# Patient Record
Sex: Male | Born: 1980
Health system: Southern US, Community
[De-identification: ages and names within clinical notes are randomized; demographics above are authoritative.]

## PROBLEM LIST (undated history)

## (undated) DIAGNOSIS — F4310 Post-traumatic stress disorder, unspecified: Secondary | ICD-10-CM

## (undated) DIAGNOSIS — I10 Essential (primary) hypertension: Secondary | ICD-10-CM

---

## 2004-07-02 ENCOUNTER — Emergency Department (HOSPITAL_COMMUNITY): Admission: EM | Admit: 2004-07-02 | Discharge: 2004-07-02 | Payer: Self-pay | Admitting: Emergency Medicine

## 2005-09-11 ENCOUNTER — Emergency Department (HOSPITAL_COMMUNITY): Admission: EM | Admit: 2005-09-11 | Discharge: 2005-09-11 | Payer: Self-pay | Admitting: Emergency Medicine

## 2010-08-15 ENCOUNTER — Emergency Department (HOSPITAL_BASED_OUTPATIENT_CLINIC_OR_DEPARTMENT_OTHER)
Admission: EM | Admit: 2010-08-15 | Discharge: 2010-08-15 | Payer: Self-pay | Source: Home / Self Care | Admitting: Emergency Medicine

## 2014-07-04 ENCOUNTER — Emergency Department (HOSPITAL_COMMUNITY): Payer: Self-pay

## 2014-07-04 ENCOUNTER — Encounter (HOSPITAL_COMMUNITY): Payer: Self-pay | Admitting: Emergency Medicine

## 2014-07-04 ENCOUNTER — Emergency Department (HOSPITAL_COMMUNITY)
Admission: EM | Admit: 2014-07-04 | Discharge: 2014-07-04 | Disposition: A | Discharge status: Home / Self Care | Payer: Self-pay | Attending: Emergency Medicine | Admitting: Emergency Medicine

## 2014-07-04 DIAGNOSIS — M2540 Effusion, unspecified joint: Secondary | ICD-10-CM | POA: Insufficient documentation

## 2014-07-04 DIAGNOSIS — L03032 Cellulitis of left toe: Secondary | ICD-10-CM | POA: Insufficient documentation

## 2014-07-04 DIAGNOSIS — R52 Pain, unspecified: Secondary | ICD-10-CM

## 2014-07-04 MED ORDER — CEPHALEXIN 500 MG PO CAPS: 500.0000 mg | ORAL_CAPSULE | Freq: Four times a day (QID) | ORAL | Status: DC

## 2014-07-04 MED ORDER — HYDROCODONE-ACETAMINOPHEN 5-325 MG PO TABS: 1.0000 | ORAL_TABLET | ORAL | Status: DC | PRN

## 2014-07-04 NOTE — ED Provider Notes (Signed)
CSN: [id number]     Arrival date & time 07/04/14  1442 History  This chart was scribed for Marlon Peliffany Diamonds Lippard, PA-C, working with Linwood DibblesJon Knapp, MD by Chestine SporeSoijett Blue, ED Scribe. The patient was seen in room WTR6/WTR6 at 4:23 PM.    Chief Complaint  Patient presents with  . Toe Pain     The history is provided by the patient. No language interpreter was used.   HPI Comments: Andrew ProctorCornell J [name] is a 33 y.o. male who presents to the Emergency Department complaining of toe pain onset 4 days. He states that the pain is in his L middle toe. He states that he was barefoot a couple days ago and he doesn't know what he did. He states that it was purple and it was swelling then. But most of the swelling and purple discoloration resolved. He states that it hurts when he puts pressure to walk on it and it is burning. He states that he is having associated symptoms of joint swelling. He denies any other symptoms. He denies a splinter or anything getting in his toe. He states that he is unemployed.  History reviewed. No pertinent past medical history. History reviewed. No pertinent past surgical history. No family history on file. History  Substance Use Topics  . Smoking status: Not on file  . Smokeless tobacco: Not on file  . Alcohol Use: Not on file    Review of Systems  Musculoskeletal: Positive for joint swelling and arthralgias.  All other systems reviewed and are negative.   Allergies  Review of patient's allergies indicates not on file.  Home Medications   Prior to Admission medications   Medication Sig Start Date End Date Taking? Authorizing Provider  cephALEXin (KEFLEX) 500 MG capsule Take 1 capsule (500 mg total) by mouth 4 (four) times daily. 07/04/14   Kasch Borquez Irine SealG Jefferson Fullam, PA-C  HYDROcodone-acetaminophen (NORCO/VICODIN) 5-325 MG per tablet Take 1-2 tablets by mouth every 4 (four) hours as needed for moderate pain or severe pain. 07/04/14   Meranda Dechaine Irine SealG Jona Zappone, PA-C   BP 151/93 mmHg  Pulse 88   Temp(Src) 98.5 F (36.9 C) (Oral)  Resp 20  SpO2 98%  Physical Exam  Constitutional: He is oriented to person, place, and time. He appears well-developed and well-nourished. No distress.  HENT:  Head: Normocephalic and atraumatic.  Eyes: EOM are normal.  Neck: Neck supple. No tracheal deviation present.  Cardiovascular: Normal rate.   Pulmonary/Chest: Effort normal. No respiratory distress.  Musculoskeletal: Normal range of motion.  Induration and swelling. Full ROM. No pain with passive ROM. No signs of trauma or skin disruption. No ecchymosis. Skin is warm and dry. Cap refill less than 2 seconds.   Neurological: He is alert and oriented to person, place, and time.  Skin: Skin is warm and dry.  Psychiatric: He has a normal mood and affect. His behavior is normal.  Nursing note and vitals reviewed.   ED Course  Procedures (including critical care time) DIAGNOSTIC STUDIES: Oxygen Saturation is 98% on room air, normal by my interpretation.    COORDINATION OF CARE: 4:25 PM-Discussed treatment plan which includes post-op boot, keflex, norco with pt at bedside and pt agreed to plan.   Labs Review Labs Reviewed - No data to display  Imaging Review Dg Toe 3rd Left  07/04/2014   CLINICAL DATA:  33 year old male with left third toe pain and swelling. No known injury.  EXAM: LEFT THIRD TOE  COMPARISON:  None.  FINDINGS: Mild focal soft tissue  swelling about the had of the third toe. No evidence of retained radiopaque foreign body, fracture or malalignment. Normal bony mineralization without evidence of lesion. The other visualized bones and joints are also unremarkable.  IMPRESSION: Focal soft tissue swelling about the distal aspect of the left third toe without evidence of acute osseous abnormality.   Electronically Signed   By: Malachy MoanHeath  McCullough M.D.   On: 07/04/2014 15:24     EKG Interpretation None      MDM   Final diagnoses:  Cellulitis of toe of left foot    Suspect soft  tissue injury vs soft tissue infection. Will treat for both and give return to ED precautions.  33 y.o.Andrew [name]'s evaluation in the Emergency Department is complete. It has been determined that no acute conditions requiring further emergency intervention are present at this time. The patient/guardian have been advised of the diagnosis and plan. We have discussed signs and symptoms that warrant return to the ED, such as changes or worsening in symptoms.  Vital signs are stable at discharge. Filed Vitals:   07/04/14 1644  BP: 131/77  Pulse: 81  Temp:   Resp: 16    Patient/guardian has voiced understanding and agreed to follow-up with the PCP or specialist.   I personally performed the services described in this documentation, which was scribed in my presence. The recorded information has been reviewed and is accurate.    Dorthula Matasiffany G Audreyana Huntsberry, PA-C 07/04/14 1856  Linwood DibblesJon Knapp, MD 07/04/14 2209

## 2014-07-04 NOTE — Discharge Instructions (Signed)

## 2014-07-04 NOTE — ED Notes (Signed)
Lt middle toe pain for 4 days, unknown injury, swelling to toe, no erythema, pt asking for x ray

## 2020-10-26 ENCOUNTER — Encounter (HOSPITAL_COMMUNITY): Payer: Self-pay | Admitting: Emergency Medicine

## 2020-10-26 ENCOUNTER — Other Ambulatory Visit: Payer: Self-pay

## 2020-10-26 ENCOUNTER — Inpatient Hospital Stay (HOSPITAL_COMMUNITY)
Admission: EM | Admit: 2020-10-26 | Discharge: 2020-11-24 | DRG: 004 | Disposition: A | Discharge status: Home Health | Payer: Medicaid Other | Attending: Internal Medicine | Admitting: Internal Medicine

## 2020-10-26 DIAGNOSIS — F411 Generalized anxiety disorder: Secondary | ICD-10-CM | POA: Diagnosis present

## 2020-10-26 DIAGNOSIS — N179 Acute kidney failure, unspecified: Secondary | ICD-10-CM | POA: Diagnosis present

## 2020-10-26 DIAGNOSIS — R5381 Other malaise: Secondary | ICD-10-CM | POA: Diagnosis present

## 2020-10-26 DIAGNOSIS — D509 Iron deficiency anemia, unspecified: Secondary | ICD-10-CM | POA: Diagnosis present

## 2020-10-26 DIAGNOSIS — J9600 Acute respiratory failure, unspecified whether with hypoxia or hypercapnia: Secondary | ICD-10-CM | POA: Diagnosis present

## 2020-10-26 DIAGNOSIS — J90 Pleural effusion, not elsewhere classified: Secondary | ICD-10-CM | POA: Diagnosis present

## 2020-10-26 DIAGNOSIS — R140 Abdominal distension (gaseous): Secondary | ICD-10-CM

## 2020-10-26 DIAGNOSIS — Z781 Physical restraint status: Secondary | ICD-10-CM

## 2020-10-26 DIAGNOSIS — R1310 Dysphagia, unspecified: Secondary | ICD-10-CM | POA: Diagnosis present

## 2020-10-26 DIAGNOSIS — E669 Obesity, unspecified: Secondary | ICD-10-CM | POA: Diagnosis present

## 2020-10-26 DIAGNOSIS — E874 Mixed disorder of acid-base balance: Secondary | ICD-10-CM | POA: Diagnosis present

## 2020-10-26 DIAGNOSIS — J9621 Acute and chronic respiratory failure with hypoxia: Principal | ICD-10-CM

## 2020-10-26 DIAGNOSIS — Z20822 Contact with and (suspected) exposure to covid-19: Secondary | ICD-10-CM | POA: Diagnosis present

## 2020-10-26 DIAGNOSIS — F4310 Post-traumatic stress disorder, unspecified: Secondary | ICD-10-CM | POA: Diagnosis present

## 2020-10-26 DIAGNOSIS — Z6836 Body mass index (BMI) 36.0-36.9, adult: Secondary | ICD-10-CM

## 2020-10-26 DIAGNOSIS — M609 Myositis, unspecified: Secondary | ICD-10-CM | POA: Diagnosis present

## 2020-10-26 DIAGNOSIS — R000 Tachycardia, unspecified: Secondary | ICD-10-CM

## 2020-10-26 DIAGNOSIS — F333 Major depressive disorder, recurrent, severe with psychotic symptoms: Secondary | ICD-10-CM

## 2020-10-26 DIAGNOSIS — Z452 Encounter for adjustment and management of vascular access device: Secondary | ICD-10-CM

## 2020-10-26 DIAGNOSIS — Z79899 Other long term (current) drug therapy: Secondary | ICD-10-CM

## 2020-10-26 DIAGNOSIS — I82463 Acute embolism and thrombosis of calf muscular vein, bilateral: Secondary | ICD-10-CM | POA: Diagnosis not present

## 2020-10-26 DIAGNOSIS — G928 Other toxic encephalopathy: Secondary | ICD-10-CM | POA: Diagnosis present

## 2020-10-26 DIAGNOSIS — Z930 Tracheostomy status: Secondary | ICD-10-CM

## 2020-10-26 DIAGNOSIS — F23 Brief psychotic disorder: Secondary | ICD-10-CM | POA: Diagnosis present

## 2020-10-26 DIAGNOSIS — E871 Hypo-osmolality and hyponatremia: Secondary | ICD-10-CM | POA: Diagnosis present

## 2020-10-26 DIAGNOSIS — R109 Unspecified abdominal pain: Secondary | ICD-10-CM

## 2020-10-26 DIAGNOSIS — J690 Pneumonitis due to inhalation of food and vomit: Secondary | ICD-10-CM | POA: Diagnosis present

## 2020-10-26 DIAGNOSIS — N170 Acute kidney failure with tubular necrosis: Secondary | ICD-10-CM | POA: Diagnosis present

## 2020-10-26 DIAGNOSIS — J323 Chronic sphenoidal sinusitis: Secondary | ICD-10-CM | POA: Diagnosis present

## 2020-10-26 DIAGNOSIS — E875 Hyperkalemia: Secondary | ICD-10-CM | POA: Diagnosis not present

## 2020-10-26 DIAGNOSIS — I10 Essential (primary) hypertension: Secondary | ICD-10-CM | POA: Diagnosis present

## 2020-10-26 DIAGNOSIS — R0902 Hypoxemia: Secondary | ICD-10-CM

## 2020-10-26 DIAGNOSIS — I248 Other forms of acute ischemic heart disease: Secondary | ICD-10-CM | POA: Diagnosis present

## 2020-10-26 DIAGNOSIS — J9601 Acute respiratory failure with hypoxia: Secondary | ICD-10-CM

## 2020-10-26 DIAGNOSIS — E870 Hyperosmolality and hypernatremia: Secondary | ICD-10-CM | POA: Diagnosis not present

## 2020-10-26 DIAGNOSIS — D6489 Other specified anemias: Secondary | ICD-10-CM | POA: Diagnosis present

## 2020-10-26 DIAGNOSIS — M6282 Rhabdomyolysis: Secondary | ICD-10-CM | POA: Diagnosis present

## 2020-10-26 DIAGNOSIS — E8770 Fluid overload, unspecified: Secondary | ICD-10-CM | POA: Diagnosis present

## 2020-10-26 DIAGNOSIS — F40240 Claustrophobia: Secondary | ICD-10-CM | POA: Diagnosis present

## 2020-10-26 DIAGNOSIS — R739 Hyperglycemia, unspecified: Secondary | ICD-10-CM | POA: Diagnosis present

## 2020-10-26 DIAGNOSIS — E876 Hypokalemia: Secondary | ICD-10-CM | POA: Diagnosis not present

## 2020-10-26 DIAGNOSIS — Z4659 Encounter for fitting and adjustment of other gastrointestinal appliance and device: Secondary | ICD-10-CM

## 2020-10-26 DIAGNOSIS — R52 Pain, unspecified: Secondary | ICD-10-CM

## 2020-10-26 DIAGNOSIS — R079 Chest pain, unspecified: Secondary | ICD-10-CM

## 2020-10-26 DIAGNOSIS — J810 Acute pulmonary edema: Secondary | ICD-10-CM | POA: Diagnosis present

## 2020-10-26 DIAGNOSIS — G4700 Insomnia, unspecified: Secondary | ICD-10-CM | POA: Diagnosis present

## 2020-10-26 DIAGNOSIS — K567 Ileus, unspecified: Secondary | ICD-10-CM | POA: Diagnosis present

## 2020-10-26 DIAGNOSIS — Z978 Presence of other specified devices: Secondary | ICD-10-CM

## 2020-10-26 LAB — BASIC METABOLIC PANEL
Anion gap: 12 (ref 5–15)
BUN: 11 mg/dL (ref 6–20)
CO2: 25 mmol/L (ref 22–32)
Calcium: 9.8 mg/dL (ref 8.9–10.3)
Chloride: 96 mmol/L — ABNORMAL LOW (ref 98–111)
Creatinine, Ser: 0.99 mg/dL (ref 0.61–1.24)
GFR, Estimated: >60 mL/min (ref >60)
Glucose, Bld: 138 mg/dL — ABNORMAL HIGH (ref 70–99)
Potassium: 4.3 mmol/L (ref 3.5–5.1)
Sodium: 133 mmol/L — ABNORMAL LOW (ref 135–145)

## 2020-10-26 LAB — CBC
HCT: 43.7 % (ref 39.0–52.0)
Hemoglobin: 14.6 g/dL (ref 13.0–17.0)
MCH: 30.5 pg (ref 26.0–34.0)
MCHC: 33.4 g/dL (ref 30.0–36.0)
MCV: 91.4 fL (ref 80.0–100.0)
Platelets: 455 10*3/uL — ABNORMAL HIGH (ref 150–400)
RBC: 4.78 MIL/uL (ref 4.22–5.81)
RDW: 12.8 % (ref 11.5–15.5)
WBC: 10.9 10*3/uL — ABNORMAL HIGH (ref 4.0–10.5)
nRBC: 0 % (ref 0.0–0.2)

## 2020-10-26 LAB — TROPONIN I (HIGH SENSITIVITY): Troponin I (High Sensitivity): 3 ng/L (ref <18)

## 2020-10-26 NOTE — ED Notes (Signed)
Pt refused X-ray

## 2020-10-26 NOTE — ED Triage Notes (Addendum)
Pt BIB [facility] from home, c/o chest pain x 2 weeks, reports dizziness that started today. Given 324mg  asa by EMS.

## 2020-10-26 NOTE — ED Notes (Signed)
Pt refused vitals and states he does not want to be seen. Tech told him to call for his ride but pt doesn't do anything

## 2020-10-27 ENCOUNTER — Inpatient Hospital Stay (HOSPITAL_COMMUNITY): Payer: Medicaid Other

## 2020-10-27 ENCOUNTER — Encounter (HOSPITAL_COMMUNITY): Payer: Self-pay | Admitting: Pulmonary Disease

## 2020-10-27 ENCOUNTER — Emergency Department (HOSPITAL_COMMUNITY): Payer: Medicaid Other

## 2020-10-27 DIAGNOSIS — R5381 Other malaise: Secondary | ICD-10-CM | POA: Diagnosis present

## 2020-10-27 DIAGNOSIS — D6489 Other specified anemias: Secondary | ICD-10-CM | POA: Diagnosis present

## 2020-10-27 DIAGNOSIS — F411 Generalized anxiety disorder: Secondary | ICD-10-CM | POA: Diagnosis present

## 2020-10-27 DIAGNOSIS — M609 Myositis, unspecified: Secondary | ICD-10-CM | POA: Diagnosis present

## 2020-10-27 DIAGNOSIS — J9600 Acute respiratory failure, unspecified whether with hypoxia or hypercapnia: Secondary | ICD-10-CM | POA: Diagnosis present

## 2020-10-27 DIAGNOSIS — E871 Hypo-osmolality and hyponatremia: Secondary | ICD-10-CM | POA: Diagnosis present

## 2020-10-27 DIAGNOSIS — M6282 Rhabdomyolysis: Secondary | ICD-10-CM | POA: Diagnosis present

## 2020-10-27 DIAGNOSIS — R4182 Altered mental status, unspecified: Secondary | ICD-10-CM

## 2020-10-27 DIAGNOSIS — J9601 Acute respiratory failure with hypoxia: Secondary | ICD-10-CM

## 2020-10-27 DIAGNOSIS — J90 Pleural effusion, not elsewhere classified: Secondary | ICD-10-CM | POA: Diagnosis present

## 2020-10-27 DIAGNOSIS — E874 Mixed disorder of acid-base balance: Secondary | ICD-10-CM | POA: Diagnosis present

## 2020-10-27 DIAGNOSIS — R079 Chest pain, unspecified: Secondary | ICD-10-CM

## 2020-10-27 DIAGNOSIS — F23 Brief psychotic disorder: Secondary | ICD-10-CM | POA: Diagnosis present

## 2020-10-27 DIAGNOSIS — E870 Hyperosmolality and hypernatremia: Secondary | ICD-10-CM | POA: Diagnosis not present

## 2020-10-27 DIAGNOSIS — J9621 Acute and chronic respiratory failure with hypoxia: Secondary | ICD-10-CM | POA: Diagnosis present

## 2020-10-27 DIAGNOSIS — D509 Iron deficiency anemia, unspecified: Secondary | ICD-10-CM | POA: Diagnosis present

## 2020-10-27 DIAGNOSIS — K567 Ileus, unspecified: Secondary | ICD-10-CM | POA: Diagnosis not present

## 2020-10-27 DIAGNOSIS — I248 Other forms of acute ischemic heart disease: Secondary | ICD-10-CM | POA: Diagnosis present

## 2020-10-27 DIAGNOSIS — J690 Pneumonitis due to inhalation of food and vomit: Secondary | ICD-10-CM | POA: Diagnosis present

## 2020-10-27 DIAGNOSIS — Z20822 Contact with and (suspected) exposure to covid-19: Secondary | ICD-10-CM | POA: Diagnosis present

## 2020-10-27 DIAGNOSIS — F4310 Post-traumatic stress disorder, unspecified: Secondary | ICD-10-CM | POA: Diagnosis present

## 2020-10-27 DIAGNOSIS — R1310 Dysphagia, unspecified: Secondary | ICD-10-CM | POA: Diagnosis present

## 2020-10-27 DIAGNOSIS — G928 Other toxic encephalopathy: Secondary | ICD-10-CM | POA: Diagnosis present

## 2020-10-27 DIAGNOSIS — J810 Acute pulmonary edema: Secondary | ICD-10-CM | POA: Diagnosis present

## 2020-10-27 DIAGNOSIS — N170 Acute kidney failure with tubular necrosis: Secondary | ICD-10-CM | POA: Diagnosis present

## 2020-10-27 DIAGNOSIS — J323 Chronic sphenoidal sinusitis: Secondary | ICD-10-CM | POA: Diagnosis present

## 2020-10-27 DIAGNOSIS — F40240 Claustrophobia: Secondary | ICD-10-CM | POA: Diagnosis present

## 2020-10-27 LAB — URINALYSIS, MICROSCOPIC (REFLEX): RBC / HPF: >50 RBC/hpf (ref 0–5)

## 2020-10-27 LAB — URINALYSIS, ROUTINE W REFLEX MICROSCOPIC
Glucose, UA: NEGATIVE mg/dL
Ketones, ur: NEGATIVE mg/dL
Nitrite: NEGATIVE
Protein, ur: >300 mg/dL — AB
Specific Gravity, Urine: >1.03 — ABNORMAL HIGH (ref 1.005–1.030)
pH: 6 (ref 5.0–8.0)

## 2020-10-27 LAB — HEPATIC FUNCTION PANEL
ALT: 25 U/L (ref 0–44)
AST: 56 U/L — ABNORMAL HIGH (ref 15–41)
Albumin: 4.5 g/dL (ref 3.5–5.0)
Alkaline Phosphatase: 62 U/L (ref 38–126)
Bilirubin, Direct: 0.1 mg/dL (ref 0.0–0.2)
Indirect Bilirubin: 0.9 mg/dL (ref 0.3–0.9)
Total Bilirubin: 1 mg/dL (ref 0.3–1.2)
Total Protein: 8.5 g/dL — ABNORMAL HIGH (ref 6.5–8.1)

## 2020-10-27 LAB — RESP PANEL BY RT-PCR (FLU A&B, COVID) ARPGX2
Influenza A by PCR: NEGATIVE
Influenza B by PCR: NEGATIVE
SARS Coronavirus 2 by RT PCR: NEGATIVE

## 2020-10-27 LAB — CK: Total CK: 141 U/L (ref 49–397)

## 2020-10-27 LAB — I-STAT ARTERIAL BLOOD GAS, ED
Acid-Base Excess: 1 mmol/L (ref 0.0–2.0)
Bicarbonate: 25 mmol/L (ref 20.0–28.0)
Calcium, Ion: 1.2 mmol/L (ref 1.15–1.40)
HCT: 43 % (ref 39.0–52.0)
Hemoglobin: 14.6 g/dL (ref 13.0–17.0)
O2 Saturation: 100 %
Patient temperature: 99
Potassium: 4 mmol/L (ref 3.5–5.1)
Sodium: 136 mmol/L (ref 135–145)
TCO2: 26 mmol/L (ref 22–32)
pCO2 arterial: 36.1 mmHg (ref 32.0–48.0)
pH, Arterial: 7.449 (ref 7.350–7.450)
pO2, Arterial: 357 mmHg — ABNORMAL HIGH (ref 83.0–108.0)

## 2020-10-27 LAB — CBC
HCT: 39.8 % (ref 39.0–52.0)
HCT: 39.8 % (ref 39.0–52.0)
Hemoglobin: 14 g/dL (ref 13.0–17.0)
Hemoglobin: 13.7 g/dL (ref 13.0–17.0)
MCH: 30.9 pg (ref 26.0–34.0)
MCH: 31.1 pg (ref 26.0–34.0)
MCHC: 35.2 g/dL (ref 30.0–36.0)
MCHC: 34.4 g/dL (ref 30.0–36.0)
MCV: 87.9 fL (ref 80.0–100.0)
MCV: 90.2 fL (ref 80.0–100.0)
Platelets: 418 10*3/uL — ABNORMAL HIGH (ref 150–400)
Platelets: 406 10*3/uL — ABNORMAL HIGH (ref 150–400)
RBC: 4.53 MIL/uL (ref 4.22–5.81)
RBC: 4.41 MIL/uL (ref 4.22–5.81)
RDW: 13.3 % (ref 11.5–15.5)
RDW: 13.3 % (ref 11.5–15.5)
WBC: 24.6 10*3/uL — ABNORMAL HIGH (ref 4.0–10.5)
WBC: 17.6 10*3/uL — ABNORMAL HIGH (ref 4.0–10.5)
nRBC: 0 % (ref 0.0–0.2)
nRBC: 0 % (ref 0.0–0.2)

## 2020-10-27 LAB — RAPID URINE DRUG SCREEN, HOSP PERFORMED
Amphetamines: NOT DETECTED
Barbiturates: NOT DETECTED
Benzodiazepines: NOT DETECTED
Cocaine: NOT DETECTED
Opiates: NOT DETECTED
Tetrahydrocannabinol: NOT DETECTED

## 2020-10-27 LAB — APTT: aPTT: 22 seconds — ABNORMAL LOW (ref 24–36)

## 2020-10-27 LAB — PHOSPHORUS
Phosphorus: 4.1 mg/dL (ref 2.5–4.6)
Phosphorus: 4.1 mg/dL (ref 2.5–4.6)

## 2020-10-27 LAB — T4, FREE: Free T4: 0.84 ng/dL (ref 0.61–1.12)

## 2020-10-27 LAB — MRSA PCR SCREENING: MRSA by PCR: NEGATIVE

## 2020-10-27 LAB — LACTIC ACID, PLASMA
Lactic Acid, Venous: >11 mmol/L (ref 0.5–1.9)
Lactic Acid, Venous: 2.1 mmol/L (ref 0.5–1.9)
Lactic Acid, Venous: 2 mmol/L (ref 0.5–1.9)

## 2020-10-27 LAB — HIV ANTIBODY (ROUTINE TESTING W REFLEX)
HIV Screen 4th Generation wRfx: NONREACTIVE
HIV Screen 4th Generation wRfx: NONREACTIVE

## 2020-10-27 LAB — AMMONIA: Ammonia: 51 umol/L — ABNORMAL HIGH (ref 9–35)

## 2020-10-27 LAB — ECHOCARDIOGRAM COMPLETE
Area-P 1/2: 5.46 cm2
Calc EF: 56.2 %
Height: 70 in
S' Lateral: 2.6 cm
Single Plane A2C EF: 55 %
Single Plane A4C EF: 58.6 %
Weight: 3600 oz

## 2020-10-27 LAB — MAGNESIUM
Magnesium: 4.3 mg/dL — ABNORMAL HIGH (ref 1.7–2.4)
Magnesium: 4 mg/dL — ABNORMAL HIGH (ref 1.7–2.4)

## 2020-10-27 LAB — GLUCOSE, CAPILLARY
Glucose-Capillary: 107 mg/dL — ABNORMAL HIGH (ref 70–99)
Glucose-Capillary: 107 mg/dL — ABNORMAL HIGH (ref 70–99)
Glucose-Capillary: 111 mg/dL — ABNORMAL HIGH (ref 70–99)
Glucose-Capillary: 122 mg/dL — ABNORMAL HIGH (ref 70–99)

## 2020-10-27 LAB — ETHANOL: Alcohol, Ethyl (B): <10 mg/dL (ref <10)

## 2020-10-27 LAB — CREATININE, SERUM
Creatinine, Ser: 1.86 mg/dL — ABNORMAL HIGH (ref 0.61–1.24)
GFR, Estimated: 46 mL/min — ABNORMAL LOW (ref >60)

## 2020-10-27 LAB — PROTIME-INR
INR: 1.1 (ref 0.8–1.2)
Prothrombin Time: 14.1 seconds (ref 11.4–15.2)

## 2020-10-27 LAB — VITAMIN B12: Vitamin B-12: 368 pg/mL (ref 180–914)

## 2020-10-27 LAB — ACETAMINOPHEN LEVEL: Acetaminophen (Tylenol), Serum: <10 ug/mL — ABNORMAL LOW (ref 10–30)

## 2020-10-27 LAB — SALICYLATE LEVEL: Salicylate Lvl: <7 mg/dL — ABNORMAL LOW (ref 7.0–30.0)

## 2020-10-27 LAB — TROPONIN I (HIGH SENSITIVITY)
Troponin I (High Sensitivity): 585 ng/L (ref <18)
Troponin I (High Sensitivity): 989 ng/L (ref <18)

## 2020-10-27 LAB — TSH: TSH: 0.363 u[IU]/mL (ref 0.350–4.500)

## 2020-10-27 MED ORDER — ACETAMINOPHEN 650 MG RE SUPP
650.0000 mg | Freq: Once | RECTAL | Status: AC
  Administered 2020-10-27: 650 mg via RECTAL
  Filled 2020-10-27: qty 1

## 2020-10-27 MED ORDER — LORAZEPAM 2 MG/ML IJ SOLN
INTRAMUSCULAR | Status: AC
  Administered 2020-10-27: 1 mg via INTRAVENOUS
  Filled 2020-10-27: qty 1

## 2020-10-27 MED ORDER — PROSOURCE TF PO LIQD
45.0000 mL | Freq: Two times a day (BID) | ORAL | Status: DC
  Filled 2020-10-27: qty 45

## 2020-10-27 MED ORDER — FENTANYL CITRATE (PF) 100 MCG/2ML IJ SOLN: 50.0000 ug | Freq: Once | INTRAMUSCULAR | Status: DC

## 2020-10-27 MED ORDER — ACETAMINOPHEN 500 MG PO TABS: 1000.0000 mg | ORAL_TABLET | Freq: Once | ORAL | Status: DC

## 2020-10-27 MED ORDER — SUCCINYLCHOLINE CHLORIDE 20 MG/ML IJ SOLN
INTRAMUSCULAR | Status: AC | PRN
  Administered 2020-10-27: 100 mg via INTRAVENOUS

## 2020-10-27 MED ORDER — ETOMIDATE 2 MG/ML IV SOLN
INTRAVENOUS | Status: AC | PRN
  Administered 2020-10-27: 20 mg via INTRAVENOUS

## 2020-10-27 MED ORDER — CHLORHEXIDINE GLUCONATE 0.12% ORAL RINSE (MEDLINE KIT)
15.0000 mL | Freq: Two times a day (BID) | OROMUCOSAL | Status: DC
  Administered 2020-10-27 – 2020-10-28 (×3): 15 mL via OROMUCOSAL

## 2020-10-27 MED ORDER — LORAZEPAM 2 MG/ML IJ SOLN
1.0000 mg | Freq: Once | INTRAMUSCULAR | Status: AC
  Administered 2020-10-27: 1 mg via INTRAVENOUS
  Filled 2020-10-27: qty 1

## 2020-10-27 MED ORDER — ZIPRASIDONE MESYLATE 20 MG IM SOLR
INTRAMUSCULAR | Status: AC
  Administered 2020-10-27: 20 mg
  Filled 2020-10-27: qty 20

## 2020-10-27 MED ORDER — SUCCINYLCHOLINE CHLORIDE 20 MG/ML IJ SOLN
100.0000 mg | Freq: Once | INTRAMUSCULAR | Status: DC
  Filled 2020-10-27: qty 5

## 2020-10-27 MED ORDER — ONDANSETRON HCL 4 MG/2ML IJ SOLN: 4.0000 mg | Freq: Once | INTRAMUSCULAR | Status: AC

## 2020-10-27 MED ORDER — PERFLUTREN LIPID MICROSPHERE
1.0000 mL | INTRAVENOUS | Status: AC | PRN
  Administered 2020-10-27: 2 mL via INTRAVENOUS
  Filled 2020-10-27: qty 10

## 2020-10-27 MED ORDER — ENOXAPARIN SODIUM 30 MG/0.3ML ~~LOC~~ SOLN
30.0000 mg | SUBCUTANEOUS | Status: DC
  Administered 2020-10-27: 30 mg via SUBCUTANEOUS
  Filled 2020-10-27 (×2): qty 0.3

## 2020-10-27 MED ORDER — VITAL HIGH PROTEIN PO LIQD
1000.0000 mL | ORAL | Status: DC
  Filled 2020-10-27: qty 1000

## 2020-10-27 MED ORDER — ETOMIDATE 2 MG/ML IV SOLN
20.0000 mg | Freq: Once | INTRAVENOUS | Status: AC
  Administered 2020-10-27: 20 mg via INTRAVENOUS
  Filled 2020-10-27: qty 10

## 2020-10-27 MED ORDER — LACTATED RINGERS IV SOLN: INTRAVENOUS | Status: DC

## 2020-10-27 MED ORDER — ONDANSETRON HCL 4 MG/2ML IJ SOLN
4.0000 mg | Freq: Four times a day (QID) | INTRAMUSCULAR | Status: DC | PRN
  Administered 2020-10-28 – 2020-11-16 (×3): 4 mg via INTRAVENOUS
  Filled 2020-10-27 (×3): qty 2

## 2020-10-27 MED ORDER — STERILE WATER FOR INJECTION IJ SOLN
INTRAMUSCULAR | Status: AC
  Filled 2020-10-27: qty 10

## 2020-10-27 MED ORDER — FENTANYL CITRATE (PF) 100 MCG/2ML IJ SOLN: 100.0000 ug | INTRAMUSCULAR | Status: DC | PRN

## 2020-10-27 MED ORDER — CHLORHEXIDINE GLUCONATE CLOTH 2 % EX PADS
6.0000 | MEDICATED_PAD | Freq: Every day | CUTANEOUS | Status: DC
  Administered 2020-10-27 – 2020-11-03 (×10): 6 via TOPICAL

## 2020-10-27 MED ORDER — PANTOPRAZOLE SODIUM 40 MG IV SOLR
40.0000 mg | Freq: Two times a day (BID) | INTRAVENOUS | Status: DC
  Administered 2020-10-27 – 2020-11-02 (×13): 40 mg via INTRAVENOUS
  Filled 2020-10-27 (×13): qty 40

## 2020-10-27 MED ORDER — SUCCINYLCHOLINE CHLORIDE 20 MG/ML IJ SOLN
100.0000 mg | Freq: Once | INTRAMUSCULAR | Status: AC
  Administered 2020-10-27: 100 mg via INTRAVENOUS
  Filled 2020-10-27: qty 5

## 2020-10-27 MED ORDER — FENTANYL BOLUS VIA INFUSION
50.0000 ug | INTRAVENOUS | Status: DC | PRN
  Administered 2020-10-27: 50 ug via INTRAVENOUS
  Filled 2020-10-27: qty 50

## 2020-10-27 MED ORDER — POLYETHYLENE GLYCOL 3350 17 G PO PACK: 17.0000 g | PACK | Freq: Every day | ORAL | Status: DC | PRN

## 2020-10-27 MED ORDER — SODIUM CHLORIDE 0.9 % IV BOLUS
1000.0000 mL | Freq: Once | INTRAVENOUS | Status: AC
  Administered 2020-10-27: 1000 mL via INTRAVENOUS

## 2020-10-27 MED ORDER — MIDAZOLAM HCL 2 MG/2ML IJ SOLN
2.0000 mg | INTRAMUSCULAR | Status: DC | PRN
  Administered 2020-10-27 – 2020-10-28 (×4): 2 mg via INTRAVENOUS
  Filled 2020-10-27 (×3): qty 2

## 2020-10-27 MED ORDER — DOCUSATE SODIUM 100 MG PO CAPS: 100.0000 mg | ORAL_CAPSULE | Freq: Two times a day (BID) | ORAL | Status: DC | PRN

## 2020-10-27 MED ORDER — FENTANYL 2500MCG IN NS 250ML (10MCG/ML) PREMIX INFUSION
50.0000 ug/h | INTRAVENOUS | Status: DC
  Administered 2020-10-27: 200 ug/h via INTRAVENOUS
  Administered 2020-10-27: 50 ug/h via INTRAVENOUS
  Filled 2020-10-27 (×2): qty 250

## 2020-10-27 MED ORDER — DOCUSATE SODIUM 50 MG/5ML PO LIQD
100.0000 mg | Freq: Two times a day (BID) | ORAL | Status: DC
  Filled 2020-10-27: qty 10

## 2020-10-27 MED ORDER — PANTOPRAZOLE SODIUM 40 MG IV SOLR: 40.0000 mg | Freq: Every day | INTRAVENOUS | Status: DC

## 2020-10-27 MED ORDER — ONDANSETRON HCL 4 MG/2ML IJ SOLN
INTRAMUSCULAR | Status: AC
  Administered 2020-10-27: 4 mg via INTRAVENOUS
  Filled 2020-10-27: qty 2

## 2020-10-27 MED ORDER — LORAZEPAM 2 MG/ML IJ SOLN: 1.0000 mg | Freq: Once | INTRAMUSCULAR | Status: AC

## 2020-10-27 MED ORDER — VITAL HIGH PROTEIN PO LIQD
1000.0000 mL | ORAL | Status: DC
  Administered 2020-10-27 – 2020-10-28 (×2): 1000 mL

## 2020-10-27 MED ORDER — SODIUM CHLORIDE 0.9 % IV SOLN
500.0000 mg | INTRAVENOUS | Status: DC
  Administered 2020-10-27: 500 mg via INTRAVENOUS
  Filled 2020-10-27 (×2): qty 500

## 2020-10-27 MED ORDER — POLYETHYLENE GLYCOL 3350 17 G PO PACK: 17.0000 g | PACK | Freq: Every day | ORAL | Status: DC

## 2020-10-27 MED ORDER — MIDAZOLAM HCL 2 MG/2ML IJ SOLN
2.0000 mg | INTRAMUSCULAR | Status: DC | PRN
  Administered 2020-10-27: 2 mg via INTRAVENOUS
  Filled 2020-10-27 (×2): qty 2

## 2020-10-27 MED ORDER — PROPOFOL 1000 MG/100ML IV EMUL
INTRAVENOUS | Status: AC
  Administered 2020-10-27: 5 ug/kg/min via INTRAVENOUS
  Filled 2020-10-27: qty 100

## 2020-10-27 MED ORDER — ORAL CARE MOUTH RINSE
15.0000 mL | OROMUCOSAL | Status: DC
  Administered 2020-10-27 – 2020-10-28 (×7): 15 mL via OROMUCOSAL

## 2020-10-27 MED ORDER — PROSOURCE TF PO LIQD
90.0000 mL | Freq: Two times a day (BID) | ORAL | Status: DC
  Filled 2020-10-27: qty 90

## 2020-10-27 MED ORDER — FENTANYL CITRATE (PF) 100 MCG/2ML IJ SOLN
100.0000 ug | INTRAMUSCULAR | Status: DC | PRN
  Administered 2020-10-27 (×2): 100 ug via INTRAVENOUS
  Filled 2020-10-27 (×2): qty 2

## 2020-10-27 MED ORDER — SODIUM CHLORIDE 0.9 % IV SOLN
2.0000 g | INTRAVENOUS | Status: DC
  Administered 2020-10-27 – 2020-10-29 (×3): 2 g via INTRAVENOUS
  Filled 2020-10-27 (×3): qty 20

## 2020-10-27 MED ORDER — PROPOFOL 1000 MG/100ML IV EMUL
0.0000 ug/kg/min | INTRAVENOUS | Status: DC
  Administered 2020-10-27 – 2020-10-28 (×6): 50 ug/kg/min via INTRAVENOUS
  Filled 2020-10-27 (×8): qty 100

## 2020-10-27 NOTE — ED Notes (Signed)
Pt refusing care 

## 2020-10-27 NOTE — Progress Notes (Addendum)
[facility]  S:  Admitted this morning. Brought in confused, combative, bloody/coffee ground gastric aspirate Fever to 102.5 Unclear baseline O  Vitals:   10/27/20 0600 10/27/20 0615 10/27/20 0630 10/27/20 0700  BP: 132/89 135/89 (!) 141/91 116/61  Pulse: (!) 108   (!) 102  Resp: (!) 21 18 18  (!) 27  Temp: 99.7 F (37.6 C)     TempSrc: Oral     SpO2: 100%   100%  Weight:      Height:       Vent Mode: PRVC FiO2 (%):  [60 %-100 %] 60 % Set Rate:  [18 bmp] 18 bmp Vt Set:  [580 mL] 580 mL PEEP:  [5 cmH20] 5 cmH20  General:  In bed on vent HENT: NCAT ETT in place PULM: CTA B, vent supported breathing CV: RRR, no mgr GI: BS+, soft, nontender MSK: normal bulk and tone Neuro: sedated on vent  CBC    Component Value Date/Time   WBC 24.6 (H) 10/27/2020 0706   RBC 4.53 10/27/2020 0706   HGB 14.0 10/27/2020 0706   HCT 39.8 10/27/2020 0706   PLT 418 (H) 10/27/2020 0706   MCV 87.9 10/27/2020 0706   MCH 30.9 10/27/2020 0706   MCHC 35.2 10/27/2020 0706   RDW 13.3 10/27/2020 0706   BMET    Component Value Date/Time   NA 133 (L) 10/26/2020 1845   K 4.3 10/26/2020 1845   CL 96 (L) 10/26/2020 1845   CO2 25 10/26/2020 1845   GLUCOSE 138 (H) 10/26/2020 1845   BUN 11 10/26/2020 1845   CREATININE 1.86 (H) 10/27/2020 0629   CALCIUM 9.8 10/26/2020 1845   GFRNONAA 46 (L) 10/27/2020 0629   UDS negative EtOH negative  A: Acute metabolic encephalopathy> sounds like drug overdose/withdrawal syndrome by history but UDS/EtOH negative; given elevated lactic acid will order EEG, check head CT when available, continue fentanyl/propofol for RASS target -1; check U/A, ammonia, B12, RPR, TSH, HIV; may need LP, f/u CT head first (not available right now due to imaging system down)  Acute respiratory failure with hypoxemia due to inability to protect airway >  Full mechanical vent support VAP prevention Daily WUA/SBT  Bloody gastric aspirate in OG tube> doesn't look like brisk GI bleed PPI  to BID CBC later tdoay  Fever, sepsis syndrome> picture worrisome for drug intoxication or withdrawal syndrome rather than sepsis but unfortunately with radiograph system down our ability to work up for aspiration/etc is limited> check resp culture, start ceftriaxone/azithro for possible aspiration pneumonia  AKI> worsening based on most recent lab, CK OK  Monitor BMET and UOP Replace electrolytes as needed IV Fluids > LR Check urinalysis Continue foley  My cc time 35 minutes  12/27/2020, MD [place] [facility] Pager: [phone] Cell: [phone] If no response, please call [phone] until 7pm After 7:00 pm call Elink  [phone]

## 2020-10-27 NOTE — Progress Notes (Signed)
ECHO at bedside then patient moving to 48M - will attempt at a later time when schedule permits.

## 2020-10-27 NOTE — ED Notes (Signed)
[employer] present, pt continues to refuse care but is also refusing to leave. Pt attempting to fight security. Pt taken down and handcuffed by [employer]. Pt now yelling "help" and "murder".

## 2020-10-27 NOTE — Progress Notes (Signed)
  Echocardiogram 2D Echocardiogram with definity has been performed.  Andrew [name] 10/27/2020, 10:15 AM

## 2020-10-27 NOTE — Progress Notes (Signed)
[facility]  The patient's girlfriend has indicated that the patient has been dealing with significant psychosocial stress lately and stopped taking his home medications.  The medications include wellbutrin and seroquel, but we don't have the full list.  We are trying to reach out to the [facility] for the entire list.  Heber York, MD [place] [facility] Pager: [phone] Cell: [phone] If no response, please call [phone] until 7pm After 7:00 pm call Elink  [phone]

## 2020-10-27 NOTE — ED Notes (Signed)
Son would like an update when possible [phone]

## 2020-10-27 NOTE — ED Notes (Signed)
[phone] pt fiance called twice for an update

## 2020-10-27 NOTE — Procedures (Signed)
Patient Name: RICHY SPRADLEY  MRN: [id number]  Epilepsy Attending: Charlsie Quest  Referring Physician/Provider: Dr. Max Fickle Date: 10/27/2020 Duration: 23.58 mins  Patient history: 40 year old male with altered mental status.  EEG to evaluate for seizures.  Level of alertness: comatose  AEDs during EEG study: Propofol  Technical aspects: This EEG study was done with scalp electrodes positioned according to the 10-20 International system of electrode placement. Electrical activity was acquired at a sampling rate of 500Hz  and reviewed with a high frequency filter of 70Hz  and a low frequency filter of 1Hz . EEG data were recorded continuously and digitally stored.   Description: EEG showed continuous generalized 3 to 5 Hz theta-delta slowing admixed with excessive amount of 15 to 18 Hz,  beta activity distributed symmetrically and diffusely. Hyperventilation and photic stimulation were not performed.     ABNORMALITY -Excessive beta, generalized -Continuous slow, generalized  IMPRESSION: This study is suggestive of moderate diffuse encephalopathy, nonspecific etiology. No seizures or epileptiform discharges were seen throughout the recording.  Taygen Acklin 

## 2020-10-27 NOTE — Progress Notes (Signed)
Patient transported to CT and then to 2M15 without any apparent complications.

## 2020-10-27 NOTE — H&P (Signed)
NAME:  Andrew [name], MRN:  [id number], DOB:  May 17, 1981, LOS: 0 ADMISSION DATE:  10/26/2020,   Brief History:  40 y/o male that presented to the ER with chest pain that developed acute hyperactive delirium  History of Present Illness:  39y/o male that presented to the ER with two weeks of chest pain.  While in the ER the pt started to refuse care and then became very combative.  Security and the police were involved because he was so belligerent.  He then became very tachycardic with hr 180 and temp of 102.5.  Pt was emergently intubated.  Since intubation, the pt is now awake and answering questions by nodding.  He also follows commands with hands and feet.   Past Medical History:  None  Significant Hospital Events:  As noted above  Consults:  none  Procedures:  Intubation in the ER  Significant Diagnostic Tests:  CXR.  No acute pathology  Micro Data:  COVID (-) Influenza (-)  Antimicrobials:  none   Objective   Blood pressure 132/89, pulse (!) 108, temperature 99.7 F (37.6 C), temperature source Oral, resp. rate (!) 21, height 5\' 10"  (1.778 m), weight 102.1 kg, SpO2 100 %.       No intake or output data in the 24 hours ending 10/27/20 0615 Filed Weights   10/27/20 0406  Weight: 102.1 kg    Examination: General: No acute distress HENT: Orally intubated MMM No JVD Lungs: CTAB no wheezing/rales/rhonchi Cardiovascular: RR tachy no murmur Abdomen: soft non distended Extremities: No edema/cyanosis/ clubbing  Distal pulses intact x4 Neuro: awake  RASS 0 follows commands with hands and feet.  Closes and opens eyes to command    Assessment & Plan:  Acute Resp Failure Acute Hyperactive Delirium  Sinus Tachycardia Chest pain for two weeks  Plan: Admit to the ICU for further workup Started on standard vent support Stat CT head is pending Sent LFT and coags Monitor neuro status Keep RASS 0-1 while further workup is being completed UDS pending Temp and HR  have normalized since intubation Monitor I/O Serial Trop until peak Check echo  Best practice (evaluated daily)  Diet: NPO Pain/Anxiety/Delirium protocol (if indicated): propofol/ fentanyl gtt with prn versed VAP protocol (if indicated): yes DVT prophylaxis: lovenox GI prophylaxis: protonix Glucose control: Monitor blood sugar Mobility: bedrest Disposition:ICU  Goals of Care:   Code Status: Full  Labs   CBC: Recent Labs  Lab 10/26/20 1845  WBC 10.9*  HGB 14.6  HCT 43.7  MCV 91.4  PLT 455*    Basic Metabolic Panel: Recent Labs  Lab 10/26/20 1845  NA 133*  K 4.3  CL 96*  CO2 25  GLUCOSE 138*  BUN 11  CREATININE 0.99  CALCIUM 9.8   GFR: Estimated Creatinine Clearance: 117.5 mL/min (by C-G formula based on SCr of 0.99 mg/dL). Recent Labs  Lab 10/26/20 1845  WBC 10.9*    Liver Function Tests: No results for input(s): AST, ALT, ALKPHOS, BILITOT, PROT, ALBUMIN in the last 168 hours. No results for input(s): LIPASE, AMYLASE in the last 168 hours. No results for input(s): AMMONIA in the last 168 hours.  ABG No results found for: PHART, PCO2ART, PO2ART, HCO3, TCO2, ACIDBASEDEF, O2SAT   Coagulation Profile: No results for input(s): INR, PROTIME in the last 168 hours.  Cardiac Enzymes: Recent Labs  Lab 10/27/20 0302  CKTOTAL 141    HbA1C: No results found for: HGBA1C  CBG: No results for input(s): GLUCAP in the last 168  hours.  Review of Systems:   Unable to complete because of intubation  Past Medical History:  He,  has no past medical history on file.   Surgical History:  History reviewed. No pertinent surgical history.   Social History:      Family History:  His family history is not on file.   Allergies No Known Allergies   Home Medications  Prior to Admission medications   Medication Sig Start Date End Date Taking? Authorizing Provider  cephALEXin (KEFLEX) 500 MG capsule Take 1 capsule (500 mg total) by mouth 4 (four) times  daily. 07/04/14   Marlon Pel, PA-C  HYDROcodone-acetaminophen (NORCO/VICODIN) 5-325 MG per tablet Take 1-2 tablets by mouth every 4 (four) hours as needed for moderate pain or severe pain. 07/04/14   Marlon Pel, PA-C     Critical care time:64mins

## 2020-10-27 NOTE — ED Notes (Signed)
Attempted to call fiance, no answer.

## 2020-10-27 NOTE — Progress Notes (Signed)
[facility]  Troponin positive EKG on admission normal Will continue to monitor, suspect demand ischemia, hold off on anticoagulants with bloody gastric aspirate  Heber Dudley, MD [place] [facility] Pager: [phone] Cell: [phone] If no response, please call [phone] until 7pm After 7:00 pm call Elink  [phone]

## 2020-10-27 NOTE — Progress Notes (Signed)
Initial Nutrition Assessment  DOCUMENTATION CODES:   Obesity unspecified  INTERVENTION:   Once placement of OG tube confirmed, initiate tube feeds: - Start Vital High Protein @ 20 ml/hr and advance by 10 ml q 4 hours to goal rate of 50 ml/hr (1200 ml/day) - ProSource TF 90 ml BID  Tube feeding regimen at goal rate provides 1360 kcal, 149 grams of protein, and 1003 ml of H2O.  Tube feeding regimen and current propofol provides 2168 total kcal (101% of needs).  NUTRITION DIAGNOSIS:   Inadequate oral intake related to inability to eat as evidenced by NPO status.   GOAL:   Patient will meet greater than or equal to 90% of their needs  MONITOR:   Vent status,Labs,Weight trends,TF tolerance  REASON FOR ASSESSMENT:   Ventilator,Consult Enteral/tube feeding initiation and management  ASSESSMENT:   40 year old male who presented to the ED on 3/2 with chest pain x 2 weeks and developed acute hyperactive delirium. No pertinent PMH. Pt intubated for airway protection.   RD consulted for tube feeding initiation and management. Pt with OG tube. Abdominal x-ray was obtained for placement verification but has not yet been read.  Noted pt with episode of coffee-ground emesis this morning. Will start tube feeding at trickle rate and advance slowly to goal.  Unable to obtain diet and weight history at this time. No weight history available in chart.  Patient is currently intubated on ventilator support MV: 10.6 L/min Temp (24hrs), Avg:99.5 F (37.5 C), Min:98.3 F (36.8 C), Max:102.5 F (39.2 C)  Drips: Propofol: 30.6 ml/hr (provides 808 kcal daily from lipid) Fentanyl: 20 ml/hr LR: 100 ml/hr  Medications reviewed and include: colace, IV protonix, miralax, IV abx  Labs reviewed: creatinine 1.86, magnesium 4.3 CBG's: 107  NUTRITION - FOCUSED PHYSICAL EXAM:  Flowsheet Row Most Recent Value  Orbital Region No depletion  Upper Arm Region No depletion  Thoracic and Lumbar  Region No depletion  Buccal Region Unable to assess  Temple Region No depletion  Clavicle Bone Region No depletion  Clavicle and Acromion Bone Region No depletion  Scapular Bone Region Unable to assess  Dorsal Hand No depletion  Patellar Region No depletion  Anterior Thigh Region No depletion  Posterior Calf Region No depletion  Edema (RD Assessment) None  Hair Reviewed  Eyes Unable to assess  Mouth Unable to assess  Skin Reviewed  Nails Reviewed       Diet Order:   Diet Order            Diet NPO time specified  Diet effective now                 EDUCATION NEEDS:   No education needs have been identified at this time  Skin:  Skin Assessment: Reviewed RN Assessment  Last BM:  no documented BM  Height:   Ht Readings from Last 1 Encounters:  10/27/20 5\' 10"  (1.778 m)    Weight:   Wt Readings from Last 1 Encounters:  10/27/20 102.1 kg    BMI:  Body mass index is 32.28 kg/m.  Estimated Nutritional Needs:   Kcal:  2150  Protein:  150-165 grams  Fluid:  >/= 2.0 L    2151, MS, RD, LDN Inpatient Clinical Dietitian Please see AMiON for contact information.
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
## 2020-10-27 NOTE — Progress Notes (Signed)
EEG complete - results pending 

## 2020-10-27 NOTE — Progress Notes (Signed)
[facility] Evening Rounds  Head CT > NAICP U/A suggestive of ATN, some hematuria Troponin remains elevated, tele OK, echo from today OK  Continue current management, wean sedation, vent on 3/3  [place], MD [place] [facility] Pager: [phone] Cell: [phone] If no response, please call [phone] until 7pm After 7:00 pm call Elink  [phone]

## 2020-10-28 DIAGNOSIS — F4310 Post-traumatic stress disorder, unspecified: Secondary | ICD-10-CM

## 2020-10-28 DIAGNOSIS — F333 Major depressive disorder, recurrent, severe with psychotic symptoms: Secondary | ICD-10-CM | POA: Insufficient documentation

## 2020-10-28 DIAGNOSIS — F411 Generalized anxiety disorder: Secondary | ICD-10-CM | POA: Diagnosis present

## 2020-10-28 DIAGNOSIS — G9340 Encephalopathy, unspecified: Secondary | ICD-10-CM

## 2020-10-28 DIAGNOSIS — N179 Acute kidney failure, unspecified: Secondary | ICD-10-CM

## 2020-10-28 LAB — GLUCOSE, CAPILLARY
Glucose-Capillary: 105 mg/dL — ABNORMAL HIGH (ref 70–99)
Glucose-Capillary: 106 mg/dL — ABNORMAL HIGH (ref 70–99)
Glucose-Capillary: 118 mg/dL — ABNORMAL HIGH (ref 70–99)
Glucose-Capillary: 121 mg/dL — ABNORMAL HIGH (ref 70–99)
Glucose-Capillary: 96 mg/dL (ref 70–99)
Glucose-Capillary: 99 mg/dL (ref 70–99)

## 2020-10-28 LAB — CK: Total CK: 32777 U/L — ABNORMAL HIGH (ref 49–397)

## 2020-10-28 LAB — TRIGLYCERIDES: Triglycerides: 332 mg/dL — ABNORMAL HIGH (ref <150)

## 2020-10-28 LAB — CBC
HCT: 36.7 % — ABNORMAL LOW (ref 39.0–52.0)
Hemoglobin: 12.4 g/dL — ABNORMAL LOW (ref 13.0–17.0)
MCH: 30.3 pg (ref 26.0–34.0)
MCHC: 33.8 g/dL (ref 30.0–36.0)
MCV: 89.7 fL (ref 80.0–100.0)
Platelets: 349 10*3/uL (ref 150–400)
RBC: 4.09 MIL/uL — ABNORMAL LOW (ref 4.22–5.81)
RDW: 13.3 % (ref 11.5–15.5)
WBC: 10.9 10*3/uL — ABNORMAL HIGH (ref 4.0–10.5)
nRBC: 0 % (ref 0.0–0.2)

## 2020-10-28 LAB — BASIC METABOLIC PANEL
Anion gap: 16 — ABNORMAL HIGH (ref 5–15)
Anion gap: 17 — ABNORMAL HIGH (ref 5–15)
BUN: 52 mg/dL — ABNORMAL HIGH (ref 6–20)
BUN: 56 mg/dL — ABNORMAL HIGH (ref 6–20)
CO2: 17 mmol/L — ABNORMAL LOW (ref 22–32)
CO2: 18 mmol/L — ABNORMAL LOW (ref 22–32)
Calcium: 8 mg/dL — ABNORMAL LOW (ref 8.9–10.3)
Calcium: 7.6 mg/dL — ABNORMAL LOW (ref 8.9–10.3)
Chloride: 101 mmol/L (ref 98–111)
Chloride: 96 mmol/L — ABNORMAL LOW (ref 98–111)
Creatinine, Ser: 5.53 mg/dL — ABNORMAL HIGH (ref 0.61–1.24)
Creatinine, Ser: 7.09 mg/dL — ABNORMAL HIGH (ref 0.61–1.24)
GFR, Estimated: 12 mL/min — ABNORMAL LOW (ref >60)
GFR, Estimated: 9 mL/min — ABNORMAL LOW (ref >60)
Glucose, Bld: 105 mg/dL — ABNORMAL HIGH (ref 70–99)
Glucose, Bld: 155 mg/dL — ABNORMAL HIGH (ref 70–99)
Potassium: 3.2 mmol/L — ABNORMAL LOW (ref 3.5–5.1)
Potassium: 3 mmol/L — ABNORMAL LOW (ref 3.5–5.1)
Sodium: 134 mmol/L — ABNORMAL LOW (ref 135–145)
Sodium: 131 mmol/L — ABNORMAL LOW (ref 135–145)

## 2020-10-28 LAB — RPR: RPR Ser Ql: NONREACTIVE

## 2020-10-28 LAB — T3, FREE: T3, Free: 2.3 pg/mL (ref 2.0–4.4)

## 2020-10-28 LAB — MAGNESIUM: Magnesium: 3.6 mg/dL — ABNORMAL HIGH (ref 1.7–2.4)

## 2020-10-28 LAB — TROPONIN I (HIGH SENSITIVITY): Troponin I (High Sensitivity): 203 ng/L (ref <18)

## 2020-10-28 LAB — PHOSPHORUS: Phosphorus: 5.3 mg/dL — ABNORMAL HIGH (ref 2.5–4.6)

## 2020-10-28 MED ORDER — ACETAMINOPHEN 325 MG PO TABS
650.0000 mg | ORAL_TABLET | Freq: Four times a day (QID) | ORAL | Status: DC | PRN
  Administered 2020-10-28 – 2020-10-30 (×3): 650 mg via ORAL
  Filled 2020-10-28 (×3): qty 2

## 2020-10-28 MED ORDER — ACETAMINOPHEN-CODEINE #3 300-30 MG PO TABS
1.0000 | ORAL_TABLET | Freq: Four times a day (QID) | ORAL | Status: DC | PRN
  Administered 2020-10-28 – 2020-10-30 (×5): 1 via ORAL
  Filled 2020-10-28 (×5): qty 1

## 2020-10-28 MED ORDER — HEPARIN SODIUM (PORCINE) 5000 UNIT/ML IJ SOLN
5000.0000 [IU] | Freq: Three times a day (TID) | INTRAMUSCULAR | Status: DC
  Administered 2020-10-28 – 2020-11-07 (×29): 5000 [IU] via SUBCUTANEOUS
  Filled 2020-10-28 (×29): qty 1

## 2020-10-28 MED ORDER — DOCUSATE SODIUM 100 MG PO CAPS: 100.0000 mg | ORAL_CAPSULE | Freq: Two times a day (BID) | ORAL | Status: DC

## 2020-10-28 MED ORDER — SODIUM CHLORIDE 0.9 % IV SOLN: INTRAVENOUS | Status: DC

## 2020-10-28 MED ORDER — ALPRAZOLAM 0.5 MG PO TABS
0.5000 mg | ORAL_TABLET | Freq: Three times a day (TID) | ORAL | Status: DC | PRN
  Administered 2020-10-28 – 2020-10-29 (×5): 0.5 mg via ORAL
  Filled 2020-10-28 (×6): qty 1

## 2020-10-28 MED ORDER — ENOXAPARIN SODIUM 30 MG/0.3ML ~~LOC~~ SOLN
30.0000 mg | SUBCUTANEOUS | Status: DC
  Filled 2020-10-28: qty 0.3

## 2020-10-28 MED ORDER — ENOXAPARIN SODIUM 40 MG/0.4ML ~~LOC~~ SOLN: 40.0000 mg | SUBCUTANEOUS | Status: DC

## 2020-10-28 MED ORDER — FENTANYL CITRATE (PF) 100 MCG/2ML IJ SOLN
25.0000 ug | Freq: Four times a day (QID) | INTRAMUSCULAR | Status: DC | PRN
  Administered 2020-10-28 – 2020-10-29 (×2): 25 ug via INTRAVENOUS
  Filled 2020-10-28 (×2): qty 2

## 2020-10-28 MED ORDER — STERILE WATER FOR INJECTION IV SOLN
INTRAVENOUS | Status: DC
  Filled 2020-10-28 (×10): qty 850

## 2020-10-28 MED ORDER — POLYETHYLENE GLYCOL 3350 17 G PO PACK: 17.0000 g | PACK | Freq: Every day | ORAL | Status: DC

## 2020-10-28 MED ORDER — ADULT MULTIVITAMIN W/MINERALS CH
1.0000 | ORAL_TABLET | Freq: Every day | ORAL | Status: DC
  Administered 2020-10-29 – 2020-10-30 (×2): 1 via ORAL
  Filled 2020-10-28 (×2): qty 1

## 2020-10-28 MED ORDER — ENSURE ENLIVE PO LIQD
237.0000 mL | Freq: Two times a day (BID) | ORAL | Status: DC
  Administered 2020-10-29 – 2020-10-30 (×3): 237 mL via ORAL
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
## 2020-10-28 NOTE — Progress Notes (Signed)
eLink Physician-Brief Progress Note Patient Name: Andrew [name] DOB: 01-27-1981 MRN: [id number]   Date of Service  10/28/2020  HPI/Events of Note  Patient is having myalgia from his rhabdomyolysis and Tylenol and Tylenol # 3 are not providing adequate relief.  eICU Interventions  Fentanyl 25 mcg iv Q 6 hours prn pains ordered.        [name] 10/28/2020, 7:54 PM

## 2020-10-28 NOTE — Progress Notes (Signed)
Nutrition Follow-up  DOCUMENTATION CODES:   Obesity unspecified  INTERVENTION:   - Ensure Enlive po BID, each supplement provides 350 kcal and 20 grams of protein  - MVI with minerals daily  - Encourage adequate PO intake  NUTRITION DIAGNOSIS:   Inadequate oral intake related to inability to eat as evidenced by NPO status.  Progressing, pt now on Regular diet  GOAL:   Patient will meet greater than or equal to 90% of their needs  Progressing  MONITOR:   PO intake,Supplement acceptance,Labs,Weight trends,I & O's  REASON FOR ASSESSMENT:   Ventilator,Consult Enteral/tube feeding initiation and management  ASSESSMENT:   40 year old male who presented to the ED on 3/2 with chest pain x 2 weeks and developed acute hyperactive delirium. No pertinent PMH. Pt intubated for airway protection.   3/04 - extubated, diet advanced to Regular  Discussed pt with RN and during ICU rounds. Pt extubated today. Per Psychiatry note, pt with a PMH of PTSD, GAD, and unclear MDD.  Spoke with pt at bedside. Pt reports not eating well at lunch but not able to explain why. Meal completion charted as 50%. Pt reports good appetite PTA (eating 3 meals a day) and states that his weight has been stable. Pt eager to try oral nutrition supplements to aid in meeting kcal and protein needs. RD to order. Will also order daily MVI with minerals.  Meal Completion: 50% x 1 meal  Medications reviewed and include: colace, IV protonix, miralax, IV abx, sodium bicarb @ 150 ml/hr  Labs reviewed: sodium 134, potassium 3.2, BUN 52, creaitnine 5.53, phosphorus 5.3, magnesium 3.6, TG 332 CBG's: 96-122 x 24 hours  UOP: 510 ml x 24 hours I/O's: +3.6 L since admit  Diet Order:   Diet Order            Diet regular Room service appropriate? Yes with Assist; Fluid consistency: Thin  Diet effective now                 EDUCATION NEEDS:   No education needs have been identified at this time  Skin:  Skin  Assessment: Reviewed RN Assessment  Last BM:  10/26/20  Height:   Ht Readings from Last 1 Encounters:  10/27/20 5\' 10"  (1.778 m)    Weight:   Wt Readings from Last 1 Encounters:  10/28/20 102 kg    BMI:  Body mass index is 32.27 kg/m.  Estimated Nutritional Needs:   Kcal:  2200-2400  Protein:  110-130 grams  Fluid:  >/= 2.0 L    12/28/20, MS, RD, LDN Inpatient Clinical Dietitian Please see [facility] for contact information.

## 2020-10-28 NOTE — Progress Notes (Signed)
Psychiatry consults to follow up on patient on Monday 10/31/2020.

## 2020-10-28 NOTE — Procedures (Signed)
Extubation Procedure Note  Patient Details:   Name: AULDEN CALISE DOB: 01-17-1981 MRN: [id number]   Airway Documentation:    Vent end date: 10/28/20 Vent end time: 0813   Evaluation  O2 sats: stable throughout Complications: No apparent complications Patient did tolerate procedure well. Bilateral Breath Sounds: Clear,Diminished   Yes  Placed on 3L [place] Incentive spirometer instructed   Newt Lukes 10/28/2020, 8:14 AM

## 2020-10-28 NOTE — Consult Note (Signed)
[facility] Consult   Reason for Consult:  History of psychosis and for titration of medications  Referring Physician:  Tawana Scale  Patient Identification: Andrew [name] MRN:  [id number] Principal Diagnosis: Acute respiratory failure ([place]) Diagnosis:  Principal Problem:   Acute respiratory failure ([place])   Total Time spent with patient: 1 hour  HPI: Andrew [name] is a 40 y.o. male patient admitted with PMHx of PTSD, generalized anxiety disorder, ? Unclear major depressive disorder with psychosis or hallucinations from PTSD presented to [facility] ED with chest pain and 12 acute hyperactive delirium.  While in ER he refused to go care and became very combative.  He was belligerent, police and security were involved.  Later, became very tachycardic with HR 180 and temperature 102.5 and was emergently intubated.  Psychiatry is consulted as records shows patient is on Seroquel and Wellbutrin, possible history of psychosis and for titration of medications. Today, patient is alert, awake, oriented to time, place and person.  He does not understand his recent hospitalization.  He states he does not remember why and how he presented to the hospital, feeling little bit confused but finally awake today.  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 He states he has PTSD, generalized anxiety disorder, major depressive disorder and takes Seroquel, Wellbutrin, prazosin and lorazepam.  He does not want to continue taking Wellbutrin as it makes him feel "weird".  He notes he was taking his medications at home but recently his increased dosage and addition of Wellbutrin made him have an "event" and he does not like this combination.  He denies any suicidal or homicidal ideations today.  He denies any auditory or visual hallucinations but states last time he heard voices and saw things was at his home.  He emphasizes on med compliance.  He denies diagnosis of  bipolar, brief psychotic disorder, schizophreniform of schizophrenia.  Collateral information: Tried calling patient's friend and son multiple times with no answer.  Called patient's sister and she was able to give limited information about patient.  She does not know psychiatric diagnosis or psychiatric medications of patient but informed that patient gets his treatment from [facility]. Called [facility] and they informed that patient has PTSD, anxiety, depression and takes prazosin 4 mg nightly, bupropion 150 mg daily, quetiapine 400 mg nightly and 1 mg lorazepam nightly.  Past Psychiatric History: PTSD, generalized anxiety disorder, major depressive disorder,? AVH either due to PTSD or MDD with psychosis.  Risk to Self:  No Risk to Others:  No Prior Inpatient Therapy:  No Prior Outpatient Therapy:  No  Past Medical History: History reviewed. No pertinent past medical history. History reviewed. No pertinent surgical history. Family History: History reviewed. No pertinent family history. Family Psychiatric  History: Patient's father has PTSD Social History: Patient lives with his girlfriend and 49-year-old daughter from other girlfriend.    Labs:  CBC Latest Ref Rng & Units 10/28/2020 10/27/2020 10/27/2020  WBC 4.0 - 10.5 K/uL 10.9(H) 17.6(H) 24.6(H)  Hemoglobin 13.0 - 17.0 g/dL 12.4(L) 13.7 14.0  Hematocrit 39.0 - 52.0 % 36.7(L) 39.8 39.8  Platelets 150 - 400 K/uL 349 406(H) 418(H)   BMP Latest Ref Rng & Units 10/28/2020 10/27/2020 10/26/2020  Glucose 70 - 99 mg/dL 105(H) - 138(H)  BUN 6 - 20 mg/dL 52(H) - 11  Creatinine 0.61 - 1.24 mg/dL 5.53(H) 1.86(H) 0.99  Sodium 135 - 145 mmol/L 134(L) 136 133(L)  Potassium 3.5 - 5.1 mmol/L  3.2(L) 4.0 4.3  Chloride 98 - 111 mmol/L 101 - 96(L)  CO2 22 - 32 mmol/L 17(L) - 25  Calcium 8.9 - 10.3 mg/dL 8.0(L) - 9.8     Current Facility-Administered Medications  Medication Dose Route Frequency Provider Last Rate Last Admin  . 0.9 %  sodium  chloride infusion   Intravenous Continuous Rigoberto Noel, MD      . acetaminophen (TYLENOL) tablet 650 mg  650 mg Oral Q6H PRN Rigoberto Noel, MD      . ALPRAZolam Duanne Moron) tablet 0.5 mg  0.5 mg Oral TID PRN Rigoberto Noel, MD   0.5 mg at 10/28/20 1203  . cefTRIAXone (ROCEPHIN) 2 g in sodium chloride 0.9 % 100 mL IVPB  2 g Intravenous Q24H Juanito Doom, MD   Stopped at 10/27/20 [phone]  . chlorhexidine gluconate (MEDLINE KIT) (PERIDEX) 0.12 % solution 15 mL  15 mL Mouth Rinse BID Simonne Maffucci B, MD   15 mL at 10/28/20 0800  . Chlorhexidine Gluconate Cloth 2 % PADS 6 each  6 each Topical Daily Juanito Doom, MD   6 each at 10/27/20 1130  . docusate sodium (COLACE) capsule 100 mg  100 mg Oral BID PRN Deland Pretty, MD      . docusate sodium (COLACE) capsule 100 mg  100 mg Oral BID Deland Pretty, MD      . heparin injection 5,000 Units  5,000 Units Subcutaneous Q8H Kara Mead V, MD      . ondansetron [facility]) injection 4 mg  4 mg Intravenous Q6H PRN Deland Pretty, MD   4 mg at 10/28/20 1203  . pantoprazole (PROTONIX) injection 40 mg  40 mg Intravenous Q12H Simonne Maffucci B, MD   40 mg at 10/28/20 1203  . polyethylene glycol (MIRALAX / GLYCOLAX) packet 17 g  17 g Oral Daily PRN Deland Pretty, MD      . Derrill Memo ON 10/29/2020] polyethylene glycol (MIRALAX / GLYCOLAX) packet 17 g  17 g Oral Daily Deland Pretty, MD      . sodium bicarbonate 150 mEq in sterile water 1,000 mL infusion   Intravenous Continuous Rigoberto Noel, MD        Musculoskeletal: Strength & Muscle Tone: decreased Gait & Station: unable to stand Patient leans: N/A  Psychiatric Specialty Exam: Physical Exam Vitals and nursing note reviewed.  Constitutional:      Appearance: He is obese. He is ill-appearing.  HENT:     Head: Normocephalic and atraumatic.  Cardiovascular:     Rate and Rhythm: Normal rate and regular rhythm.  Pulmonary:     Effort: Pulmonary effort is  normal.  Neurological:     Mental Status: He is oriented to person, place, and time.     Review of Systems  Blood pressure (!) 146/87, pulse 87, temperature 98.6 F (37 C), temperature source Axillary, resp. rate 19, height _0  (1.778 m), weight 102 kg, SpO2 100 %.Body mass index is 32.27 kg/m.  General Appearance: Casual  Eye Contact:  Good  Speech:  Normal Rate  Volume:  Normal  Mood:  Normal but confuses  Affect:  Appropriate  Thought Process:  Linear and Descriptions of Associations: Intact  Orientation:  Full (Time, Place, and Person)  Thought Content:  Logical  Suicidal Thoughts:  No  Homicidal Thoughts:  No  Memory:  Immediate;   Poor Recent;   Fair Remote;   Fair  Judgement:  Fair  Insight:  Fair  Psychomotor Activity:  Normal  Concentration:  Concentration: Good and Attention Span: Good  Recall:  [place] of Knowledge:  Good  Language:  Good  Akathisia:  No  Handed:  Right  AIMS (if indicated):     Assets:  Communication Skills Desire for Improvement Housing Resilience Social Support  ADL's:  Impaired  Cognition:  WNL  Sleep:      Assessment:40 year old male presented with chest pain and admitted for acute respiratory failure, rhabdomyolysis and acute hyperactive delirium which is resolved now. -On examination today patient denies any suicidal or homicidal ideations. He denies any auditory or visual hallucinations.  He is confused about his hospitalization but is alert, awake and oriented to time place and person.  He is able to give a clear and coherent past medical history.  He does not seem like responding to internal stimuli. -From [place]: Prazosin 2 mg (4 mg nightly), bupropion 150 mg daily, quetiapine 400 mg nightly, lorazepam 1 mg nightly. Prazosin should be restarted as well for posttraumatic stress disorder.  Would not be wise to start lorazepam but he has Xanax as needed available.  Discontinuing Wellbutrin as patient had confusion after  starting it and requests not to restart it again.  Patient cannot be started on Seroquel or any other antipsychotic as he presented with rhabdomyolysis.  He has to see his outpatient [facility] provider to restart his medication as he is well-known to them.  It safe at this point not to start any medication as he denies suicidal homicidal ideations.  He denies any auditory or visual hallucinations. -EKG on 10/27/2020 QTC is 461  Treatment plan: -Start prazosin 4 mg QHS for PTSD (if primary team agrees) -Do not start Seroquel or any other antipsychotic due to patient's presentation with rhabdomyolysis. -Do not start Wellbutrin or lorazepam.  Patient gets his outpatient treatment from [facility] and it would be wise to continue/change/restart as outpatient. -Continue Xanax 3 times daily as needed for anxiety -Daily contact with patient to assess and evaluate symptoms and progress in treatment. -Psychiatry will continue to follow.   Disposition:  No evidence of imminent risk to self or others at present.   Patient does not meet criteria for psychiatric inpatient admission.  Honor Junes, MD 10/28/2020 12:51 PM

## 2020-10-29 ENCOUNTER — Inpatient Hospital Stay (HOSPITAL_COMMUNITY): Payer: Medicaid Other

## 2020-10-29 DIAGNOSIS — M6282 Rhabdomyolysis: Secondary | ICD-10-CM

## 2020-10-29 LAB — GLUCOSE, CAPILLARY
Glucose-Capillary: 114 mg/dL — ABNORMAL HIGH (ref 70–99)
Glucose-Capillary: 122 mg/dL — ABNORMAL HIGH (ref 70–99)
Glucose-Capillary: 116 mg/dL — ABNORMAL HIGH (ref 70–99)
Glucose-Capillary: 128 mg/dL — ABNORMAL HIGH (ref 70–99)
Glucose-Capillary: 139 mg/dL — ABNORMAL HIGH (ref 70–99)

## 2020-10-29 LAB — CBC
HCT: 31.8 % — ABNORMAL LOW (ref 39.0–52.0)
Hemoglobin: 11.3 g/dL — ABNORMAL LOW (ref 13.0–17.0)
MCH: 31.4 pg (ref 26.0–34.0)
MCHC: 35.5 g/dL (ref 30.0–36.0)
MCV: 88.3 fL (ref 80.0–100.0)
Platelets: 190 10*3/uL (ref 150–400)
RBC: 3.6 MIL/uL — ABNORMAL LOW (ref 4.22–5.81)
RDW: 12.9 % (ref 11.5–15.5)
WBC: 11.3 10*3/uL — ABNORMAL HIGH (ref 4.0–10.5)
nRBC: 0 % (ref 0.0–0.2)

## 2020-10-29 LAB — BASIC METABOLIC PANEL
Anion gap: 18 — ABNORMAL HIGH (ref 5–15)
BUN: 61 mg/dL — ABNORMAL HIGH (ref 6–20)
CO2: 23 mmol/L (ref 22–32)
Calcium: 7 mg/dL — ABNORMAL LOW (ref 8.9–10.3)
Chloride: 90 mmol/L — ABNORMAL LOW (ref 98–111)
Creatinine, Ser: 8.88 mg/dL — ABNORMAL HIGH (ref 0.61–1.24)
GFR, Estimated: 7 mL/min — ABNORMAL LOW (ref >60)
Glucose, Bld: 124 mg/dL — ABNORMAL HIGH (ref 70–99)
Potassium: 3 mmol/L — ABNORMAL LOW (ref 3.5–5.1)
Sodium: 131 mmol/L — ABNORMAL LOW (ref 135–145)

## 2020-10-29 LAB — PHOSPHORUS: Phosphorus: 6 mg/dL — ABNORMAL HIGH (ref 2.5–4.6)

## 2020-10-29 LAB — MAGNESIUM: Magnesium: 2.6 mg/dL — ABNORMAL HIGH (ref 1.7–2.4)

## 2020-10-29 LAB — CK: Total CK: 41656 U/L — ABNORMAL HIGH (ref 49–397)

## 2020-10-29 MED ORDER — HYDROMORPHONE HCL 1 MG/ML IJ SOLN
0.5000 mg | INTRAMUSCULAR | Status: DC | PRN
Start: 2020-10-29 — End: 2020-10-29
  Administered 2020-10-29 (×2): 0.5 mg via INTRAVENOUS
  Filled 2020-10-29 (×2): qty 0.5

## 2020-10-29 MED ORDER — LIDOCAINE 4 % EX CREA
TOPICAL_CREAM | Freq: Two times a day (BID) | CUTANEOUS | Status: DC | PRN
  Administered 2020-10-29: 1 via TOPICAL
  Filled 2020-10-29 (×2): qty 5

## 2020-10-29 MED ORDER — POTASSIUM CHLORIDE CRYS ER 20 MEQ PO TBCR
40.0000 meq | EXTENDED_RELEASE_TABLET | Freq: Once | ORAL | Status: AC
  Administered 2020-10-29: 40 meq via ORAL
  Filled 2020-10-29: qty 2

## 2020-10-29 MED ORDER — FENTANYL CITRATE (PF) 100 MCG/2ML IJ SOLN
25.0000 ug | INTRAMUSCULAR | Status: DC | PRN
  Administered 2020-10-29: 25 ug via INTRAVENOUS
  Filled 2020-10-29: qty 2

## 2020-10-29 MED ORDER — VANCOMYCIN VARIABLE DOSE PER UNSTABLE RENAL FUNCTION (PHARMACIST DOSING): Status: DC

## 2020-10-29 MED ORDER — FENTANYL CITRATE (PF) 100 MCG/2ML IJ SOLN
50.0000 ug | INTRAMUSCULAR | Status: DC | PRN
  Administered 2020-10-30 (×2): 50 ug via INTRAVENOUS
  Filled 2020-10-29 (×2): qty 2

## 2020-10-29 MED ORDER — VANCOMYCIN HCL 2000 MG/400ML IV SOLN
2000.0000 mg | Freq: Once | INTRAVENOUS | Status: AC
  Administered 2020-10-29: 2000 mg via INTRAVENOUS
  Filled 2020-10-29: qty 400

## 2020-10-29 MED ORDER — WHITE PETROLATUM EX OINT
TOPICAL_OINTMENT | CUTANEOUS | Status: AC
  Filled 2020-10-29: qty 28.35

## 2020-10-29 MED ORDER — CEFAZOLIN SODIUM-DEXTROSE 1-4 GM/50ML-% IV SOLN
1.0000 g | Freq: Two times a day (BID) | INTRAVENOUS | Status: DC
Start: 2020-10-29 — End: 2020-10-31
  Administered 2020-10-30 (×3): 1 g via INTRAVENOUS
  Filled 2020-10-29 (×4): qty 50

## 2020-10-29 NOTE — Progress Notes (Signed)
NAME:  Andrew [name], MRN:  [id number], DOB:  02-16-1981, LOS: 2 ADMISSION DATE:  10/26/2020,   Brief History:  40 y/o male that presented to the ER with chest pain that developed acute hyperactive delirium requiring mechanical ventilation.  Subsequently found to be in rhabdo with AKI   Past Medical History:  PTSD Depression   Significant Hospital Events:  3/4 extubated, AKI worse, found to be in rhabdo  Consults:  Renal  Procedures:  ETT 3/3  >> 3/4  Significant Diagnostic Tests:  Head CT >> sphenoid sinusitis Echo 3/3 >> nml LVEF, no WMA  Micro Data:  COVID (-) Influenza (-) resp 3/3 >> GPC, GNR >>  Antimicrobials:  ceftx 3/3 >> azithro 3/3 >>    SUBJ   Continues to have diffuse muscle pain. Critically ill, poor urine output Afebrile   Objective   Blood pressure 140/84, pulse 78, temperature 99.4 F (37.4 C), temperature source Oral, resp. rate (!) 34, height 5\' 10"  (1.778 m), weight 105.6 kg, SpO2 95 %.        Intake/Output Summary (Last 24 hours) at 10/29/2020 0901 Last data filed at 10/29/2020 0700 Gross per 24 hour  Intake 3593.15 ml  Output 188 ml  Net 3405.15 ml   Filed Weights   10/27/20 0406 10/28/20 0500 10/29/20 0500  Weight: 102.1 kg 102 kg 105.6 kg    Examination: General: Well-built man, anxious affect, appears to be in pain HENT: No pallor, no icterus, no JVD Lungs: Clear breath sounds, no rhonchi, no accessory muscle use Cardiovascular: RR tachy no murmur Abdomen: soft non distended Extremities: No edema/cyanosis/ clubbing  Distal pulses intact x4 tenderness in all muscle groups Neuro: Calm, speech okay, nonfocal  Chest x-ray 3/3 independently reviewed which does not show any infiltrates, ET tube in position  Labs show worsening creatinine, hypokalemia 3.0, mild leukocytosis, stable anemia  Assessment & Plan:   Acute Hyperactive Delirium  -He was on multiple psychoactive medications including Seroquel and Wellbutrin -UDS  and head CT negative -Obtain psych consult, holding all medications except Xanax for anxiety , rhabdo has been noted with both Wellbutrin and Seroquel  Acute Resp Failure -Resolved  Aspiration pneumonitis/sphenoid sinusitis -Await respiratory culture and based on this can simplify from ceftriaxone to Augmentin  Sinus Tachycardia Chest pain for two weeks -Elevated troponin -No wall motion on echo is reassuring  AKI , rhabdomyolysis -LFTs mildly elevated -Continue to hydrate aggressively with bicarb drip at 150 and saline at 20, total fluids 200/hour and check serial bmet/ CK -Use Tylenol/codeine for moderate pain and Dilaudid for severe pain -No indication for dialysis yet, replete mild hypokalemia  Bloody/coffee-ground NG aspirate -Hemoglobin stable, Lovenox has been restarted, monitor CBC. Protonix twice daily Since hemoglobin is stable, will defer GI evaluation  Best practice (evaluated daily)  Diet: NPO Pain/Anxiety/Delirium protocol (if indicated): Dilaudid as needed VAP protocol (if indicated): yes DVT prophylaxis: lovenox GI prophylaxis: protonix Glucose control: Monitor blood sugar Mobility: bedrest Disposition:ICU  Goals of Care:   Code Status: Full  Labs   CBC: Recent Labs  Lab 10/26/20 1845 10/27/20 0653 10/27/20 0706 10/27/20 1655 10/28/20 0706 10/29/20 0346  WBC 10.9*  --  24.6* 17.6* 10.9* 11.3*  HGB 14.6 14.6 14.0 13.7 12.4* 11.3*  HCT 43.7 43.0 39.8 39.8 36.7* 31.8*  MCV 91.4  --  87.9 90.2 89.7 88.3  PLT 455*  --  418* 406* 349 190    Basic Metabolic Panel: Recent Labs  Lab 10/26/20 1845 10/27/20 0629 10/27/20 12/27/20  10/27/20 1142 10/27/20 1655 10/28/20 0706 10/28/20 1831  NA 133*  --  136  --   --  134* 131*  K 4.3  --  4.0  --   --  3.2* 3.0*  CL 96*  --   --   --   --  101 96*  CO2 25  --   --   --   --  17* 18*  GLUCOSE 138*  --   --   --   --  105* 155*  BUN 11  --   --   --   --  52* 56*  CREATININE 0.99 1.86*  --   --   --   5.53* 7.09*  CALCIUM 9.8  --   --   --   --  8.0* 7.6*  MG  --   --   --  4.3* 4.0* 3.6*  --   PHOS  --   --   --  4.1 4.1 5.3*  --    GFR: Estimated Creatinine Clearance: 16.7 mL/min (A) (by C-G formula based on SCr of 7.09 mg/dL (H)). Recent Labs  Lab 10/27/20 0445 10/27/20 0701 10/27/20 0706 10/27/20 1142 10/27/20 1655 10/28/20 0706 10/29/20 0346  WBC  --   --  24.6*  --  17.6* 10.9* 11.3*  LATICACIDVEN >11.0* 2.1*  --  2.0*  --   --   --     Liver Function Tests: Recent Labs  Lab 10/27/20 0527  AST 56*  ALT 25  ALKPHOS 62  BILITOT 1.0  PROT 8.5*  ALBUMIN 4.5   No results for input(s): LIPASE, AMYLASE in the last 168 hours. Recent Labs  Lab 10/27/20 1142  AMMONIA 51*    ABG    Component Value Date/Time   PHART 7.449 10/27/2020 0653   PCO2ART 36.1 10/27/2020 0653   PO2ART 357 (H) 10/27/2020 0653   HCO3 25.0 10/27/2020 0653   TCO2 26 10/27/2020 0653   O2SAT 100.0 10/27/2020 0653     Coagulation Profile: Recent Labs  Lab 10/27/20 0527  INR 1.1    Cardiac Enzymes: Recent Labs  Lab 10/27/20 0302 10/28/20 0706  CKTOTAL 141 32,777*    HbA1C: No results found for: HGBA1C  CBG: Recent Labs  Lab 10/28/20 1503 10/28/20 1959 10/28/20 2341 10/29/20 0346 10/29/20 0803  GLUCAP 105* 118* 106* 114* 122*       Critical care time: 31 m    Cyril Mourning MD. FCCP. [facility] Pager : [phone]  If no response to pager , please call [phone] until 7 pm After 7:00 pm call Elink  [phone]   10/29/2020

## 2020-10-29 NOTE — Progress Notes (Signed)
Pharmacy Antibiotic Note  Andrew [name] is a 40 y.o. male admitted on 10/26/2020 with Osteomyelitis.  Pharmacy has been consulted for vancomycin dosing.  Plan per MD to change from ceftriaxone to cefazolin and vancomycin. Has severe pain in R arm (no redness) - plan for CT scan. CK trending up to 41,656. Afebrile. Scr up to 8.88. On day #3 of ceftriaxone. Currently no indication for urgent HD.   Plan: Cefazolin 1g IV every 12 hours Vancomycin 2g IV once - monitor by level or if plan for HD Monitor renal fx, cx results, clinical pic, vanc levels as appropriate  Height: 5\' 10"  (177.8 cm) Weight: 105.6 kg (232 lb 12.9 oz) IBW/kg (Calculated) : 73  Temp (24hrs), Avg:98.5 F (36.9 C), Min:97.6 F (36.4 C), Max:99.4 F (37.4 C)  Recent Labs  Lab 10/26/20 1845 10/27/20 0445 10/27/20 0629 10/27/20 0701 10/27/20 0706 10/27/20 1142 10/27/20 1655 10/28/20 0706 10/28/20 1831 10/29/20 0346 10/29/20 1047  WBC 10.9*  --   --   --  24.6*  --  17.6* 10.9*  --  11.3*  --   CREATININE 0.99  --  1.86*  --   --   --   --  5.53* 7.09*  --  8.88*  LATICACIDVEN  --  >11.0*  --  2.1*  --  2.0*  --   --   --   --   --     Estimated Creatinine Clearance: 13.3 mL/min (A) (by C-G formula based on SCr of 8.88 mg/dL (H)).    No Known Allergies  Antimicrobials this admission: Azithromycin 3/3 x1 Ceftriaxone 3/3 >> 3/5 Vancomycin 3/5 >>  Cefazolin 3/5 >>  Dose adjustments this admission: N/A  Microbiology results: 3/5 BCx: sent 3/3 BCx: ngtd  3/3 Sputum: normal flora  3/3 MRSA PCR: neg  Thank you for allowing pharmacy to be a part of this patient's care.  12/29/20, PharmD, BCCCP Clinical Pharmacist  Phone: [phone] 10/29/2020 10:58 PM  Please check AMION for all [facility] Pharmacy phone numbers After 10:00 PM, call Main Pharmacy [phone]

## 2020-10-29 NOTE — Progress Notes (Addendum)
eLink Physician-Brief Progress Note Patient Name: IDEN STRIPLING DOB: [birth year] MRN: [id number]   Date of Service  10/29/2020  HPI/Events of Note  RN notified me that patient complains of pain all over (not new) but now also says his right arm hurts. Not able to lift it without pain. No swelling per RN. Pulses are great. No rash. No redness. No IV infusing there at this time. Seen on camera, talking on his phone in no distress and stable vitals. Says dilaudid which is ordered q4h is making him dizzy and notes fentanyl worked better. Fully awake and alert.   eICU Interventions  PRN fentanyl ordered      Intervention Category Intermediate Interventions: Pain - evaluation and management  Adda Stokes G Ambar Raphael 10/29/2020, 8:46 PM   9:45 pm -  Repeat call for worsening arm pain, now with ? Swelling and numbness Patient intermittently confused and cannot fully evaluate on camera, hurling abuses because of pain Have notified ground team and Dr Catha Gosselin is evaluating   4:45 am K is 3.2 but creatinine also 10+ Will not replete at this time D/w RN and MRI is pending Renal to follow up RN notes patient is asleep, I see him intermittently moving on camera. Has some delirium issues so I am going to DC the ativan order for now (was not given anyway)   1:10 am Ativan x1 requested for MRI as patient is claustrophobic Was seen by ground team and planned for MRI of his shoulder but will not go for MRI without some anxiety control he is fully awake and alert

## 2020-10-29 NOTE — Progress Notes (Signed)
Pt's RASS changed from 0 to -1. Pt has not received pain medication since 1121. Girlfriend at the bedside. Will continue to monitor this situation

## 2020-10-29 NOTE — Evaluation (Signed)
Physical Therapy Evaluation Patient Details Name: Andrew [name] MRN: [id number] DOB: 28-Dec-1980 Today's Date: 10/29/2020   History of Present Illness  39y/o male admitted 3/2 with two weeks of chest pain.  While in the ER pt refusing care and became very combative with security involved. He then became very tachycardic with HR 180 and temp of 102.5.  Pt was emergently intubated 3/2- 3/4. PMhx: PTSD, anxiety  Clinical Impression  Pt very pleasant and wanting to get home for his birthday and see his 3yo daughter. Pt with significant pain and stiffness with bil LE and RUE. Pt with decreased transfers, gait, functional mobility limited by pain and stiffness who will benefit from acute therapy to maximize mobility, safety, gait and independence to decrease burden of care. Pt encouraged to be OOB to chair throughout the day with staff.   HR 73 SpO2 96% on RA    Follow Up Recommendations Home health PT    Equipment Recommendations  None recommended by PT    Recommendations for Other Services OT consult     Precautions / Restrictions Precautions Precautions: Fall      Mobility  Bed Mobility Overal bed mobility: Needs Assistance Bed Mobility: Rolling;Sidelying to Sit Rolling: Min guard Sidelying to sit: Min guard       General bed mobility comments: cues for sequence with increased time and bed flat    Transfers Overall transfer level: Needs assistance   Transfers: Sit to/from Stand Sit to Stand: Min guard         General transfer comment: guarding for stability and safety  Ambulation/Gait Ambulation/Gait assistance: Min guard Gait Distance (Feet): 300 Feet Assistive device: None Gait Pattern/deviations: Step-through pattern;Decreased stride length;Wide base of support   Gait velocity interpretation: 1.31 - 2.62 ft/sec, indicative of limited community ambulator General Gait Details: pt with slightly unsteady gait with wide BOS, decreased speed and tight calves with  pain limiting movement. HR 73 and SpO2 96% on RA  Stairs            Wheelchair Mobility    Modified Rankin (Stroke Patients Only)       Balance Overall balance assessment: Needs assistance Sitting-balance support: No upper extremity supported Sitting balance-Leahy Scale: Good     Standing balance support: No upper extremity supported Standing balance-Leahy Scale: Good                               Pertinent Vitals/Pain Pain Assessment: 0-10 Pain Score: 10-Worst pain ever Pain Location: RUE, bil calves Pain Descriptors / Indicators: Aching;Guarding;Constant;Tightness;Throbbing Pain Intervention(s): Limited activity within patient's tolerance;Monitored during session;Repositioned;Patient requesting pain meds-RN notified    Home Living Family/patient expects to be discharged to:: Private residence Living Arrangements: Spouse/significant other Available Help at Discharge: Available 24 hours/day;Friend(s) Type of Home: House Home Access: Stairs to enter   [employer] of Steps: 8 Home Layout: One level Home Equipment: None      Prior Function Level of Independence: Independent         Comments: no falls in the last year, disabled veteran     Hand Dominance        Extremity/Trunk Assessment   Upper Extremity Assessment Upper Extremity Assessment: Generalized weakness;RUE deficits/detail RUE Deficits / Details: pt with very stiff movement of RUE limited to grossly 2/5 due to pain and shoulder flexion 90 degrees AAROM RUE: Unable to fully assess due to pain    Lower Extremity Assessment Lower Extremity  Assessment: Generalized weakness;Difficult to assess due to impaired cognition (bil limb pain with limited activation of gastroc bil)    Cervical / Trunk Assessment Cervical / Trunk Assessment: Normal  Communication   Communication: No difficulties  Cognition Arousal/Alertness: Awake/alert Behavior During Therapy: WFL for tasks  assessed/performed Overall Cognitive Status: Within Functional Limits for tasks assessed                                        General Comments      Exercises General Exercises - Lower Extremity Long Arc Quad: AROM;Both;Seated;10 reps   Assessment/Plan    PT Assessment Patient needs continued PT services  PT Problem List Decreased strength;Decreased mobility;Decreased activity tolerance;Decreased balance;Pain;Decreased range of motion       PT Treatment Interventions Gait training;Balance training;Stair training;Functional mobility training;Therapeutic activities;Patient/family education;Therapeutic exercise    PT Goals (Current goals can be found in the Care Plan section)  Acute Rehab PT Goals Patient Stated Goal: fish and spend time with my daughter (20 yo) PT Goal Formulation: With patient Time For Goal Achievement: 11/12/20 Potential to Achieve Goals: Fair    Frequency Min 3X/week   Barriers to discharge        Co-evaluation               AM-PAC PT "6 Clicks" Mobility  Outcome Measure Help needed turning from your back to your side while in a flat bed without using bedrails?: A Little Help needed moving from lying on your back to sitting on the side of a flat bed without using bedrails?: A Little Help needed moving to and from a bed to a chair (including a wheelchair)?: A Little Help needed standing up from a chair using your arms (e.g., wheelchair or bedside chair)?: A Little Help needed to walk in hospital room?: A Little Help needed climbing 3-5 steps with a railing? : A Little 6 Click Score: 18    End of Session Equipment Utilized During Treatment: Gait belt Activity Tolerance: Patient tolerated treatment well Patient left: in chair;with call bell/phone within reach;with chair alarm set Nurse Communication: Mobility status PT Visit Diagnosis: Other abnormalities of gait and mobility (R26.89);Muscle weakness (generalized)  (M62.81);Pain Pain - Right/Left: Right Pain - part of body: Arm    Time: 4235-3614 PT Time Calculation (min) (ACUTE ONLY): 28 min   Charges:   PT Evaluation $PT Eval Moderate Complexity: 1 Mod          Kelcey Wickstrom P, PT Acute Rehabilitation Services Pager: [phone] Office: [phone]   Enedina Finner Latalia Etzler 10/29/2020, 10:37 AM

## 2020-10-29 NOTE — Consult Note (Signed)
Reason for Consult: AKI Referring Physician:  Elsworth Soho, MD  Andrew [name] is an 40 y.o. male with a PMH sginificant for PTSD, and major depressive disorder who presented to [facility] ED via EMS c/o chest pain for [redacted] weeks along with dizziness.  In the ED the patient became confused and violent, fighting with [employer] and was handcuffed.  He developed a fever at 102.5, tachypnea, and tachycardia up to 180.  He was given sedation and intubated by the PA in the ED and admitted to ICU for further evaluation.  Initial labs notable for WBC 10.9, Na 133, and Cr 0.99.  UDS negative.  He developed bloody/coffee ground gastric aspirate and started on PPI.   EEG suggested moderate, diffuse encephalopathy without seizure activity.  His hospital course was further complicated by the development of oliguric AKI and rhabdomyolysis with CK level >32,000 and was started on IV bicarb 10/28/20.  We were consulted to help further evaluate and manage his AKI.  The trend in Scr is seen below.   Mr. Radu was extubated and denies any NSAID/COX-II use or illicit drug use.  He denies any history of kidney disease.  He now complains of diffuse muscle pain.  Trend in Creatinine: Creatinine, Ser  Date/Time Value Ref Range Status  10/28/2020 06:31 PM 7.09 (H) 0.61 - 1.24 mg/dL Final  10/28/2020 07:06 AM 5.53 (H) 0.61 - 1.24 mg/dL Final  10/27/2020 06:29 AM 1.86 (H) 0.61 - 1.24 mg/dL Final  10/26/2020 06:45 PM 0.99 0.61 - 1.24 mg/dL Final    PMH:  History reviewed. No pertinent past medical history.  PSH:  History reviewed. No pertinent surgical history.  Allergies: No Known Allergies  Medications:   Prior to Admission medications   Medication Sig Start Date End Date Taking? Authorizing Provider  buPROPion (WELLBUTRIN) 100 MG tablet Take 100 mg by mouth daily.   Yes [provider]  QUEtiapine (SEROQUEL) 400 MG tablet Take 400 mg by mouth at bedtime.   Yes [provider]    Inpatient medications: .  Chlorhexidine Gluconate Cloth  6 each Topical Daily  . feeding supplement  237 mL Oral BID BM  . heparin injection (subcutaneous)  5,000 Units Subcutaneous Q8H  . multivitamin with minerals  1 tablet Oral Daily  . pantoprazole (PROTONIX) IV  40 mg Intravenous Q12H    Discontinued Meds:   Medications Discontinued During This Encounter  Medication Reason  . acetaminophen (TYLENOL) tablet 1,000 mg   . succinylcholine (ANECTINE) injection 841 mg Duplicate  . pantoprazole (PROTONIX) injection 40 mg   . fentaNYL (SUBLIMAZE) injection 100 mcg   . fentaNYL (SUBLIMAZE) injection 100 mcg   . feeding supplement (VITAL HIGH PROTEIN) liquid 1,000 mL   . feeding supplement (PROSource TF) liquid 45 mL   . cephALEXin (KEFLEX) 500 MG capsule Patient Preference  . HYDROcodone-acetaminophen (NORCO/VICODIN) 5-325 MG per tablet Patient Preference  . enoxaparin (LOVENOX) injection 30 mg   . propofol (DIPRIVAN) 1000 MG/100ML infusion   . fentaNYL (SUBLIMAZE) injection 50 mcg   . fentaNYL 2552mg in NS 2572m(10100mml) infusion-PREMIX   . fentaNYL (SUBLIMAZE) bolus via infusion 50 mcg   . midazolam (VERSED) injection 2 mg   . midazolam (VERSED) injection 2 mg   . feeding supplement (PROSource TF) liquid 90 mL   . feeding supplement (VITAL HIGH PROTEIN) liquid 1,000 mL   . azithromycin (ZITHROMAX) 500 mg in sodium chloride 0.9 % 250 mL IVPB   . enoxaparin (LOVENOX) injection 40 mg   . docusate (  COLACE) 50 MG/5ML liquid 100 mg   . polyethylene glycol (MIRALAX / GLYCOLAX) packet 17 g   . enoxaparin (LOVENOX) injection 30 mg   . lactated ringers infusion   . MEDLINE mouth rinse   . fentaNYL (SUBLIMAZE) injection 25 mcg   . docusate sodium (COLACE) capsule 100 mg   . polyethylene glycol (MIRALAX / GLYCOLAX) packet 17 g   . chlorhexidine gluconate (MEDLINE KIT) (PERIDEX) 0.12 % solution 15 mL Change in therapy    Social History:  has no history on file for tobacco use, alcohol use, and drug use.  Family  History:  History reviewed. No pertinent family history.  Pertinent items are noted in HPI. Weight change: 3.6 kg  Intake/Output Summary (Last 24 hours) at 10/29/2020 1317 Last data filed at 10/29/2020 1000 Gross per 24 hour  Intake 3723.81 ml  Output 198 ml  Net 3525.81 ml   BP 121/74   Pulse 83   Temp 99.4 F (37.4 C) (Oral)   Resp (!) 36   Ht 5' 10"  (1.778 m)   Wt 105.6 kg   SpO2 95%   BMI 33.40 kg/m  Vitals:   10/29/20 0900 10/29/20 1000 10/29/20 1030 10/29/20 1100  BP: 113/67 121/74    Pulse:   73 83  Resp: 16 (!) 23  (!) 36  Temp:      TempSrc:      SpO2:   96% 95%  Weight:      Height:         General appearance: mild distress and slowed mentation Head: Normocephalic, without obvious abnormality, atraumatic Eyes: negative findings: lids and lashes normal, conjunctivae and sclerae normal and corneas clear Resp: clear to auscultation bilaterally Cardio: no rub and tachycardic GI: soft, non-tender; bowel sounds normal; no masses,  no organomegaly Extremities: edema trace lower extremities and tenderness in all muscle groups  Labs: Basic Metabolic Panel: Recent Labs  Lab 10/26/20 1845 10/27/20 0527 10/27/20 0629 10/27/20 0653 10/27/20 1142 10/27/20 1655 10/28/20 0706 10/28/20 1831  NA 133*  --   --  136  --   --  134* 131*  K 4.3  --   --  4.0  --   --  3.2* 3.0*  CL 96*  --   --   --   --   --  101 96*  CO2 25  --   --   --   --   --  17* 18*  GLUCOSE 138*  --   --   --   --   --  105* 155*  BUN 11  --   --   --   --   --  52* 56*  CREATININE 0.99  --  1.86*  --   --   --  5.53* 7.09*  ALBUMIN  --  4.5  --   --   --   --   --   --   CALCIUM 9.8  --   --   --   --   --  8.0* 7.6*  PHOS  --   --   --   --  4.1 4.1 5.3*  --    Liver Function Tests: Recent Labs  Lab 10/27/20 0527  AST 56*  ALT 25  ALKPHOS 62  BILITOT 1.0  PROT 8.5*  ALBUMIN 4.5   No results for input(s): LIPASE, AMYLASE in the last 168 hours. Recent Labs  Lab 10/27/20 1142   AMMONIA 51*   CBC: Recent Labs  Lab 10/27/20 0706 10/27/20 1655 10/28/20 0706 10/29/20 0346  WBC 24.6* 17.6* 10.9* 11.3*  HGB 14.0 13.7 12.4* 11.3*  HCT 39.8 39.8 36.7* 31.8*  MCV 87.9 90.2 89.7 88.3  PLT 418* 406* 349 190   PT/INR: @LABRCNTIP (inr:5) Cardiac Enzymes: ) Recent Labs  Lab 10/27/20 0302 10/28/20 0706  CKTOTAL 141 32,777*   CBG: Recent Labs  Lab 10/28/20 1959 10/28/20 2341 10/29/20 0346 10/29/20 0803 10/29/20 1120  GLUCAP 118* 106* 114* 122* 116*    Iron Studies: No results for input(s): IRON, TIBC, TRANSFERRIN, FERRITIN in the last 168 hours.  Xrays/Other Studies: DG Abd 1 View  Result Date: 10/27/2020 CLINICAL DATA:  Check gastric catheter placement EXAM: ABDOMEN - 1 VIEW COMPARISON:  None. FINDINGS: Gastric catheter is noted within the distal stomach. Nonobstructive bowel gas pattern is seen. IMPRESSION: Gastric catheter within the stomach. Electronically Signed   By: Inez Catalina M.D.   On: 10/27/2020 15:11   EEG adult  Result Date: 10/27/2020 Lora Havens, MD     10/27/2020  1:59 PM Patient Name: NIGIL BRAMAN MRN: [id number] Epilepsy Attending: Lora Havens Referring Physician/Provider: Dr. Simonne Maffucci Date: 10/27/2020 Duration: 23.58 mins Patient history: 40 year old male with altered mental status.  EEG to evaluate for seizures. Level of alertness: comatose AEDs during EEG study: Propofol Technical aspects: This EEG study was done with scalp electrodes positioned according to the 10-20 International system of electrode placement. Electrical activity was acquired at a sampling rate of 500Hz  and reviewed with a high frequency filter of 70Hz  and a low frequency filter of 1Hz . EEG data were recorded continuously and digitally stored. Description: EEG showed continuous generalized 3 to 5 Hz theta-delta slowing admixed with excessive amount of 15 to 18 Hz,  beta activity distributed symmetrically and diffusely. Hyperventilation and photic  stimulation were not performed.   ABNORMALITY -Excessive beta, generalized -Continuous slow, generalized IMPRESSION: This study is suggestive of moderate diffuse encephalopathy, nonspecific etiology. No seizures or epileptiform discharges were seen throughout the recording. Priyanka Barbra Sarks     Assessment/Plan: 1.  AKI presumably due to acute rhabdomyolysis- agree with isotonic bicarb drip.  No urgent indication for dialysis at this time and will continue to follow.  Will order renal US for completeness.  Will also order acute GN serologies given blood, protein seen on UA.  2. Acute hyperactive delirium - seen by psych who started prazosin but held seroquel (due to rhabdo), lorazepam, and wellbutrin.  Also started on xanax tid prn 3. Acute rhabdomyolysis - on bicarb drip.  Possibly due to Seroquel.  No history of intoxication or unresponsiveness.  4. Bloody/coffee ground NG aspirate - hgb stable, on protonix bid and lovenox has been restarted.  GI consulted.  5. Hypokalemia - replete gently and follow 6. Hyponatremia - due to #1. Continue to follow.    Broadus John A Gwendolyne Welford 10/29/2020, 1:17 PM

## 2020-10-30 ENCOUNTER — Inpatient Hospital Stay (HOSPITAL_COMMUNITY): Payer: Medicaid Other

## 2020-10-30 DIAGNOSIS — J9600 Acute respiratory failure, unspecified whether with hypoxia or hypercapnia: Secondary | ICD-10-CM

## 2020-10-30 LAB — ANTISTREPTOLYSIN O TITER: ASO: 139 IU/mL (ref 0.0–200.0)

## 2020-10-30 LAB — CBC
HCT: 27 % — ABNORMAL LOW (ref 39.0–52.0)
Hemoglobin: 9.6 g/dL — ABNORMAL LOW (ref 13.0–17.0)
MCH: 31 pg (ref 26.0–34.0)
MCHC: 35.6 g/dL (ref 30.0–36.0)
MCV: 87.1 fL (ref 80.0–100.0)
Platelets: 251 10*3/uL (ref 150–400)
RBC: 3.1 MIL/uL — ABNORMAL LOW (ref 4.22–5.81)
RDW: 12.6 % (ref 11.5–15.5)
WBC: 10.3 10*3/uL (ref 4.0–10.5)
nRBC: 0 % (ref 0.0–0.2)

## 2020-10-30 LAB — RAPID URINE DRUG SCREEN, HOSP PERFORMED
Amphetamines: NOT DETECTED
Barbiturates: NOT DETECTED
Benzodiazepines: POSITIVE — AB
Cocaine: NOT DETECTED
Opiates: POSITIVE — AB
Tetrahydrocannabinol: NOT DETECTED

## 2020-10-30 LAB — POCT I-STAT 7, (LYTES, BLD GAS, ICA,H+H)
Acid-Base Excess: 8 mmol/L — ABNORMAL HIGH (ref 0.0–2.0)
Bicarbonate: 34.6 mmol/L — ABNORMAL HIGH (ref 20.0–28.0)
Calcium, Ion: 0.78 mmol/L — CL (ref 1.15–1.40)
HCT: 33 % — ABNORMAL LOW (ref 39.0–52.0)
Hemoglobin: 11.2 g/dL — ABNORMAL LOW (ref 13.0–17.0)
O2 Saturation: 100 %
Patient temperature: 99.9
Potassium: 3.6 mmol/L (ref 3.5–5.1)
Sodium: 129 mmol/L — ABNORMAL LOW (ref 135–145)
TCO2: 36 mmol/L — ABNORMAL HIGH (ref 22–32)
pCO2 arterial: 58 mmHg — ABNORMAL HIGH (ref 32.0–48.0)
pH, Arterial: 7.387 (ref 7.350–7.450)
pO2, Arterial: 529 mmHg — ABNORMAL HIGH (ref 83.0–108.0)

## 2020-10-30 LAB — BASIC METABOLIC PANEL
Anion gap: 19 — ABNORMAL HIGH (ref 5–15)
BUN: 64 mg/dL — ABNORMAL HIGH (ref 6–20)
CO2: 27 mmol/L (ref 22–32)
Calcium: 6.5 mg/dL — ABNORMAL LOW (ref 8.9–10.3)
Chloride: 83 mmol/L — ABNORMAL LOW (ref 98–111)
Creatinine, Ser: 10.16 mg/dL — ABNORMAL HIGH (ref 0.61–1.24)
GFR, Estimated: 6 mL/min — ABNORMAL LOW (ref >60)
Glucose, Bld: 110 mg/dL — ABNORMAL HIGH (ref 70–99)
Potassium: 3.2 mmol/L — ABNORMAL LOW (ref 3.5–5.1)
Sodium: 129 mmol/L — ABNORMAL LOW (ref 135–145)

## 2020-10-30 LAB — CULTURE, RESPIRATORY W GRAM STAIN: Culture: NORMAL

## 2020-10-30 LAB — GLUCOSE, CAPILLARY
Glucose-Capillary: 100 mg/dL — ABNORMAL HIGH (ref 70–99)
Glucose-Capillary: 102 mg/dL — ABNORMAL HIGH (ref 70–99)
Glucose-Capillary: 109 mg/dL — ABNORMAL HIGH (ref 70–99)
Glucose-Capillary: 114 mg/dL — ABNORMAL HIGH (ref 70–99)
Glucose-Capillary: 119 mg/dL — ABNORMAL HIGH (ref 70–99)
Glucose-Capillary: 124 mg/dL — ABNORMAL HIGH (ref 70–99)
Glucose-Capillary: 200 mg/dL — ABNORMAL HIGH (ref 70–99)

## 2020-10-30 LAB — MAGNESIUM: Magnesium: 2.4 mg/dL (ref 1.7–2.4)

## 2020-10-30 LAB — C4 COMPLEMENT: Complement C4, Body Fluid: 25 mg/dL (ref 12–38)

## 2020-10-30 LAB — URIC ACID: Uric Acid, Serum: 17 mg/dL — ABNORMAL HIGH (ref 3.7–8.6)

## 2020-10-30 LAB — PHOSPHORUS: Phosphorus: 7.3 mg/dL — ABNORMAL HIGH (ref 2.5–4.6)

## 2020-10-30 LAB — CK: Total CK: 28110 U/L — ABNORMAL HIGH (ref 49–397)

## 2020-10-30 LAB — C3 COMPLEMENT: C3 Complement: 115 mg/dL (ref 82–167)

## 2020-10-30 MED ORDER — POTASSIUM CHLORIDE CRYS ER 20 MEQ PO TBCR
40.0000 meq | EXTENDED_RELEASE_TABLET | Freq: Once | ORAL | Status: AC
  Administered 2020-10-30: 40 meq via ORAL
  Filled 2020-10-30: qty 2

## 2020-10-30 MED ORDER — FENTANYL CITRATE (PF) 100 MCG/2ML IJ SOLN
50.0000 ug | Freq: Once | INTRAMUSCULAR | Status: AC
Start: 2020-10-30 — End: 2020-10-30
  Administered 2020-10-30: 50 ug via INTRAVENOUS

## 2020-10-30 MED ORDER — FENTANYL BOLUS VIA INFUSION
50.0000 ug | INTRAVENOUS | Status: DC | PRN
Start: 2020-10-30 — End: 2020-11-07
  Administered 2020-10-30 – 2020-11-07 (×18): 50 ug via INTRAVENOUS
  Filled 2020-10-30: qty 50

## 2020-10-30 MED ORDER — POTASSIUM CHLORIDE 20 MEQ PO PACK
40.0000 meq | PACK | Freq: Once | ORAL | Status: AC
  Administered 2020-10-30: 40 meq via ORAL
  Filled 2020-10-30: qty 2

## 2020-10-30 MED ORDER — FENTANYL 2500MCG IN NS 250ML (10MCG/ML) PREMIX INFUSION
0.0000 ug/h | INTRAVENOUS | Status: DC
  Administered 2020-10-30: 50 ug/h via INTRAVENOUS
  Administered 2020-10-31: 300 ug/h via INTRAVENOUS
  Administered 2020-10-31: 350 ug/h via INTRAVENOUS
  Administered 2020-10-31 – 2020-11-01 (×2): 150 ug/h via INTRAVENOUS
  Administered 2020-11-02: 75 ug/h via INTRAVENOUS
  Administered 2020-11-02: 200 ug/h via INTRAVENOUS
  Administered 2020-11-03: 350 ug/h via INTRAVENOUS
  Administered 2020-11-03: 400 ug/h via INTRAVENOUS
  Administered 2020-11-03: 200 ug/h via INTRAVENOUS
  Administered 2020-11-04: 350 ug/h via INTRAVENOUS
  Administered 2020-11-04 (×2): 400 ug/h via INTRAVENOUS
  Administered 2020-11-05 (×2): 250 ug/h via INTRAVENOUS
  Administered 2020-11-05: 400 ug/h via INTRAVENOUS
  Administered 2020-11-06: 250 ug/h via INTRAVENOUS
  Administered 2020-11-06: 300 ug/h via INTRAVENOUS
  Administered 2020-11-06 – 2020-11-07 (×3): 400 ug/h via INTRAVENOUS
  Administered 2020-11-07: 350 ug/h via INTRAVENOUS
  Filled 2020-10-30 (×22): qty 250

## 2020-10-30 MED ORDER — MIDAZOLAM HCL 2 MG/2ML IJ SOLN
INTRAMUSCULAR | Status: AC
  Administered 2020-10-30: 2 mg via INTRAVENOUS
  Filled 2020-10-30: qty 4

## 2020-10-30 MED ORDER — LORAZEPAM 2 MG/ML IJ SOLN: 1.0000 mg | Freq: Once | INTRAMUSCULAR | Status: DC

## 2020-10-30 MED ORDER — NALOXONE HCL 0.4 MG/ML IJ SOLN
0.4000 mg | Freq: Once | INTRAMUSCULAR | Status: AC
  Administered 2020-10-30: 0.4 mg via INTRAVENOUS

## 2020-10-30 MED ORDER — NALOXONE HCL 0.4 MG/ML IJ SOLN
INTRAMUSCULAR | Status: AC
  Filled 2020-10-30: qty 1

## 2020-10-30 MED ORDER — ROCURONIUM BROMIDE 10 MG/ML (PF) SYRINGE
PREFILLED_SYRINGE | INTRAVENOUS | Status: AC
  Administered 2020-10-30: 40 mg via INTRAVENOUS
  Filled 2020-10-30: qty 10

## 2020-10-30 MED ORDER — POLYETHYLENE GLYCOL 3350 17 G PO PACK
17.0000 g | PACK | Freq: Every day | ORAL | Status: DC
  Administered 2020-10-31: 17 g
  Filled 2020-10-30: qty 1

## 2020-10-30 MED ORDER — DOCUSATE SODIUM 50 MG/5ML PO LIQD
100.0000 mg | Freq: Two times a day (BID) | ORAL | Status: DC
  Administered 2020-10-31 – 2020-11-02 (×3): 100 mg
  Filled 2020-10-30 (×5): qty 10

## 2020-10-30 MED ORDER — ORAL CARE MOUTH RINSE
15.0000 mL | OROMUCOSAL | Status: DC
  Administered 2020-10-30 – 2020-11-08 (×80): 15 mL via OROMUCOSAL

## 2020-10-30 MED ORDER — CHLORHEXIDINE GLUCONATE 0.12% ORAL RINSE (MEDLINE KIT)
15.0000 mL | Freq: Two times a day (BID) | OROMUCOSAL | Status: DC
  Administered 2020-10-30 – 2020-11-07 (×17): 15 mL via OROMUCOSAL

## 2020-10-30 MED ORDER — ROCURONIUM BROMIDE 10 MG/ML (PF) SYRINGE: 40.0000 mg | PREFILLED_SYRINGE | Freq: Once | INTRAVENOUS | Status: AC

## 2020-10-30 MED ORDER — PROPOFOL 1000 MG/100ML IV EMUL
INTRAVENOUS | Status: AC
  Administered 2020-10-30: 5 ug/kg/min via INTRAVENOUS
  Filled 2020-10-30: qty 100

## 2020-10-30 MED ORDER — FENTANYL CITRATE (PF) 100 MCG/2ML IJ SOLN
INTRAMUSCULAR | Status: AC
  Filled 2020-10-30: qty 2

## 2020-10-30 MED ORDER — FENTANYL CITRATE (PF) 100 MCG/2ML IJ SOLN
100.0000 ug | Freq: Once | INTRAMUSCULAR | Status: AC
  Administered 2020-10-30: 100 ug via INTRAVENOUS

## 2020-10-30 MED ORDER — ACETAMINOPHEN 160 MG/5ML PO SOLN
650.0000 mg | Freq: Four times a day (QID) | ORAL | Status: DC | PRN
  Administered 2020-10-31 – 2020-11-06 (×4): 650 mg
  Filled 2020-10-30 (×4): qty 20.3

## 2020-10-30 MED ORDER — LORAZEPAM 2 MG/ML IJ SOLN: 0.5000 mg | Freq: Once | INTRAMUSCULAR | Status: DC

## 2020-10-30 MED ORDER — FENTANYL CITRATE (PF) 100 MCG/2ML IJ SOLN
25.0000 ug | INTRAMUSCULAR | Status: DC | PRN
  Administered 2020-11-02 – 2020-11-07 (×11): 50 ug via INTRAVENOUS
  Filled 2020-10-30: qty 2

## 2020-10-30 MED ORDER — MIDAZOLAM HCL 2 MG/2ML IJ SOLN: 2.0000 mg | Freq: Once | INTRAMUSCULAR | Status: AC

## 2020-10-30 MED ORDER — ETOMIDATE 2 MG/ML IV SOLN
INTRAVENOUS | Status: AC
  Administered 2020-10-30: 10 mg
  Filled 2020-10-30: qty 20

## 2020-10-30 MED ORDER — PROPOFOL 1000 MG/100ML IV EMUL
0.0000 ug/kg/min | INTRAVENOUS | Status: DC
  Administered 2020-10-30 – 2020-10-31 (×4): 50 ug/kg/min via INTRAVENOUS
  Filled 2020-10-30 (×2): qty 200

## 2020-10-30 NOTE — Progress Notes (Signed)
Patient ID: HENRY UTSEY, male   DOB: August 12, 1981, 40 y.o.   MRN: [id number] S: Main complaint today is right arm pain.  Otherwise feeling a little better.  Made 475 cc of urine overnight. O:BP (!) 163/95 (BP Location: Left Arm)   Pulse 79   Temp 99.9 F (37.7 C) (Axillary)   Resp (!) 34   Ht 5\' 10"  (1.778 m)   Wt 105.6 kg   SpO2 93%   BMI 33.40 kg/m   Intake/Output Summary (Last 24 hours) at 10/30/2020 1052 Last data filed at 10/30/2020 1000 Gross per 24 hour  Intake 3806.14 ml  Output 475 ml  Net 3331.14 ml   Intake/Output: I/O last 3 completed shifts: In: 5663 [I.V.:5283.3; IV Piggyback:379.7] Out: 493 [Urine:493]  Intake/Output this shift:  Total I/O In: 619.2 [P.O.:200; I.V.:419.2] Out: 110 [Urine:110] Weight change:  Gen: sleepy but in NAD CVS: RRR Resp: CTA Abd: distended, +BS, soft, NT Ext: diffuse nonpitting edema of all extremities.   Recent Labs  Lab 10/26/20 1845 10/27/20 0527 10/27/20 0629 10/27/20 0653 10/27/20 1142 10/27/20 1655 10/28/20 0706 10/28/20 1831 10/29/20 1047 10/30/20 0159  NA 133*  --   --  136  --   --  134* 131* 131* 129*  K 4.3  --   --  4.0  --   --  3.2* 3.0* 3.0* 3.2*  CL 96*  --   --   --   --   --  101 96* 90* 83*  CO2 25  --   --   --   --   --  17* 18* 23 27  GLUCOSE 138*  --   --   --   --   --  105* 155* 124* 110*  BUN 11  --   --   --   --   --  52* 56* 61* 64*  CREATININE 0.99  --  1.86*  --   --   --  5.53* 7.09* 8.88* 10.16*  ALBUMIN  --  4.5  --   --   --   --   --   --   --   --   CALCIUM 9.8  --   --   --   --   --  8.0* 7.6* 7.0* 6.5*  PHOS  --   --   --   --  4.1 4.1 5.3*  --  6.0* 7.3*  AST  --  56*  --   --   --   --   --   --   --   --   ALT  --  25  --   --   --   --   --   --   --   --    Liver Function Tests: Recent Labs  Lab 10/27/20 0527  AST 56*  ALT 25  ALKPHOS 62  BILITOT 1.0  PROT 8.5*  ALBUMIN 4.5   No results for input(s): LIPASE, AMYLASE in the last 168 hours. Recent Labs  Lab  10/27/20 1142  AMMONIA 51*   CBC: Recent Labs  Lab 10/27/20 0706 10/27/20 1655 10/28/20 0706 10/29/20 0346 10/30/20 0159  WBC 24.6* 17.6* 10.9* 11.3* 10.3  HGB 14.0 13.7 12.4* 11.3* 9.6*  HCT 39.8 39.8 36.7* 31.8* 27.0*  MCV 87.9 90.2 89.7 88.3 87.1  PLT 418* 406* 349 190 251   Cardiac Enzymes: Recent Labs  Lab 10/27/20 0302 10/28/20 0706 10/29/20 1047 10/30/20 0159  CKTOTAL 141 32,777*  41,656* 28,110*   CBG: Recent Labs  Lab 10/29/20 1611 10/29/20 2047 10/30/20 0029 10/30/20 0454 10/30/20 0805  GLUCAP 128* 139* 102* 124* 100*    Iron Studies: No results for input(s): IRON, TIBC, TRANSFERRIN, FERRITIN in the last 72 hours. Studies/Results: DG Shoulder 1V Right  Result Date: 10/29/2020 CLINICAL DATA:  Posterior right shoulder pain focus that EXAM: RIGHT SHOULDER - 1 VIEW COMPARISON:  None. FINDINGS: No acute bony abnormality. Specifically, no fracture, subluxation, or dislocation. No radiographic evidence of osteomyelitis. Mild soft tissue swelling about the shoulder. Sheet-like radiodensity projecting over the proximal upper arm, nonspecific, suspect this is external to the patient. Recommend visual inspection. IMPRESSION: 1. Mild soft tissue swelling about the shoulder. No acute osseous abnormality or evidence of osteomyelitis. 2. Some extensive radiodensity projecting over the proximal upper arm, nonspecific, suspect this is external to the patient. Recommend visual inspection. Electronically Signed   By: Kreg Shropshire M.D.   On: 10/29/2020 23:02   US RENAL  Result Date: 10/29/2020 CLINICAL DATA:  Acute renal insufficiency EXAM: RENAL / URINARY TRACT ULTRASOUND COMPLETE COMPARISON:  None. FINDINGS: Right Kidney: Renal measurements: 9.9 x 5.7 x 6.5 cm = volume: 190.6 mL. Echogenicity within normal limits. No mass or hydronephrosis visualized. Left Kidney: Renal measurements: 11.0 x 5.6 x 5.3 cm = volume: 169.0 mL. Echogenicity within normal limits. No mass or  hydronephrosis visualized. Bladder: Decompressed with a Foley catheter. Other: None. IMPRESSION: 1. The right kidney is normal in appearance. 2. Visualization of the left kidney is limited but no abnormalities are seen. 3. The bladder is decompressed with a Foley catheter. Electronically Signed   By: Gerome Sam III M.D   On: 10/29/2020 15:41   MR SHOULDER RIGHT WO CONTRAST  Result Date: 10/30/2020 CLINICAL DATA:  Patient admitted 10/27/2020 with acute delirium. Chest and right shoulder pain. Elevated CK. Question septic arthritis. EXAM: MRI OF THE RIGHT SHOULDER WITHOUT CONTRAST TECHNIQUE: Multiplanar, multisequence MR imaging of the shoulder was performed. No intravenous contrast was administered. COMPARISON:  Plain films right shoulder 10/29/2020. FINDINGS: The patient was unable to complete the study. Axial and coronal T2 weighted imaging and coronal PD weighted imaging is provided. Rotator cuff:  Intact. Muscles: There is intense edema in the infraspinatus and teres minor muscles. Milder degree of edema is seen in the deltoid, worse posteriorly. There is also some edema in the visualized short head of biceps. No focal fluid collection is seen within muscle. There is fluid tracking about fascial planes. Biceps long head:  Intact. Acromioclavicular Joint: Mild osteoarthritis. No joint effusion. There is some fluid in the subacromial/subdeltoid bursa. Glenohumeral Joint: There is no joint effusion. Cartilage surface is preserved. Labrum:  Intact. Bones: Marrow signal is negative. No evidence of osteomyelitis. No fracture or focal bony lesion. Other: None IMPRESSION: Edema in musculature about the shoulder is worst in the infraspinatus and teres minor and consistent with rhabdomyolysis or infectious or inflammatory myositis. No muscle or tendon tear is identified. No intramuscular abscess. Negative for septic joint or osteomyelitis. Electronically Signed   By: Drusilla Kanner M.D.   On: 10/30/2020 09:44    . Chlorhexidine Gluconate Cloth  6 each Topical Daily  . feeding supplement  237 mL Oral BID BM  . heparin injection (subcutaneous)  5,000 Units Subcutaneous Q8H  . multivitamin with minerals  1 tablet Oral Daily  . pantoprazole (PROTONIX) IV  40 mg Intravenous Q12H  . potassium chloride  40 mEq Oral Once    BMET  Component Value Date/Time   NA 129 (L) 10/30/2020 0159   K 3.2 (L) 10/30/2020 0159   CL 83 (L) 10/30/2020 0159   CO2 27 10/30/2020 0159   GLUCOSE 110 (H) 10/30/2020 0159   BUN 64 (H) 10/30/2020 0159   CREATININE 10.16 (H) 10/30/2020 0159   CALCIUM 6.5 (L) 10/30/2020 0159   GFRNONAA 6 (L) 10/30/2020 0159   CBC    Component Value Date/Time   WBC 10.3 10/30/2020 0159   RBC 3.10 (L) 10/30/2020 0159   HGB 9.6 (L) 10/30/2020 0159   HCT 27.0 (L) 10/30/2020 0159   PLT 251 10/30/2020 0159   MCV 87.1 10/30/2020 0159   MCH 31.0 10/30/2020 0159   MCHC 35.6 10/30/2020 0159   RDW 12.6 10/30/2020 0159    Assessment/Plan: 1.  AKI presumably due to acute rhabdomyolysis- agree with isotonic bicarb drip.  No urgent indication for dialysis at this time and will continue to follow.   1. renal US without obstruction.   2. acute GN serologies given blood, protein seen on UA are pending 3. CK levels starting to decline and UOP improving. 4. Continue with IVF's for now and change to NS given CO2 of 27 5. No urgent indication for dialysis at this time and hopefully will start to see some improvement soon.  2. Acute hyperactive delirium - seen by psych who started prazosin but held seroquel (due to rhabdo), lorazepam, and wellbutrin.  Also started on xanax tid prn 3. Acute rhabdomyolysis - on IVF's with peak CK of 41,656 yesterday and now down to 28,110.  Possibly due to Seroquel/Wellbutrin.  No history of intoxication or unresponsiveness, however he is a poor historian and doesn't remember what happened before admission.  4. Bloody/coffee ground NG aspirate - hgb stable, on protonix  bid and lovenox has been restarted.  GI consulted.  5. Hypokalemia - replete and follow 6. Hyponatremia - due to #1. Continue to follow.  7. Aspiration pneumonitis/sphenoid sinusitis - treated with ceftriaxone then changed to ancef/vanc due to concerns for septic shoulder.  MRI negative now back on ancef.   Irena Cords, MD [employer] [phone]

## 2020-10-30 NOTE — Progress Notes (Signed)
eLink Physician-Brief Progress Note Patient Name: Andrew [name] DOB: 1980/11/08 MRN: [id number]   Date of Service  10/30/2020  HPI/Events of Note  Called by RN for change in breathing pattern and level of alertness. Patient is increasingly difficult to rouse.   On my exam via in-room camera, patient is breathing 38/min and shallow. He appears obtunded.   eICU Interventions  Notified ground team (Dr. Wynona Neat) immediately via phone to come and assess the patient for possible intubation.  Ordered STAT ABG.     Intervention Category Major Interventions: Respiratory failure - evaluation and management  Janae Bridgeman 10/30/2020, 7:43 PM

## 2020-10-30 NOTE — Progress Notes (Signed)
Nurse to notify [facility] if vascular access is still needing to be placed by [facility]. VU. Tomasita Morrow, RN [facility]

## 2020-10-30 NOTE — Progress Notes (Signed)
NAME:  Andrew [name], MRN:  [id number], DOB:  1981-01-03, LOS: 3 ADMISSION DATE:  10/26/2020,   Brief History:  40 y/o male that presented to the ER with chest pain that developed acute hyperactive delirium requiring mechanical ventilation.  Subsequently found to be in rhabdo with AKI   Past Medical History:  PTSD Depression   Significant Hospital Events:  3/4 extubated, AKI worse, found to be in rhabdo 3/5 Continues to have diffuse muscle pain, renal consult  Consults:  Renal  Procedures:  ETT 3/3  >> 3/4  Significant Diagnostic Tests:  Head CT >> sphenoid sinusitis Echo 3/3 >> nml LVEF, no WMA MRI right shoulder 3/6>> no evidence of septic joint, no effusion, severe edema and muscles around shoulder joint consistent with rhabdo  Micro Data:  COVID (-) Influenza (-) resp 3/3 >> GPC, GNR >>nml flora Blood 3/3 >> ng Blood 3/6 >>  Antimicrobials:  ceftx 3/3 >> 3/6 azithro 3/3 >> Cefazolin 3/6 >> vanc 3/6  X 1 dose    SUBJ  Events overnight noted, increased body pain particularly right shoulder, covering physician is worried about septic arthritis, MRI obtained   Objective   Blood pressure (!) 153/93, pulse 78, temperature 99.9 F (37.7 C), temperature source Axillary, resp. rate (!) 35, height 5\' 10"  (1.778 m), weight 105.6 kg, SpO2 96 %.        Intake/Output Summary (Last 24 hours) at 10/30/2020 1021 Last data filed at 10/30/2020 1000 Gross per 24 hour  Intake 3606.14 ml  Output 475 ml  Net 3131.14 ml   Filed Weights   10/27/20 0406 10/28/20 0500 10/29/20 0500  Weight: 102.1 kg 102 kg 105.6 kg    Examination: General: Well-built man, somnolent, received Ativan for MRI and fentanyl for pain this morning HENT: No pallor, no icterus, no JVD Lungs: Clear breath sounds, no rhonchi, no accessory muscle use Cardiovascular: RR tachy no murmur Abdomen: soft non distended Extremities: No edema/cyanosis/ clubbing pain in all muscle groups especially in both  shoulder Neuro: Follows one-step commands speech slurred, grossly nonfocal  Chest x-ray 3/3 independently reviewed which does not show any infiltrates, ET tube in position  Labs show worsening creatinine up to 10, hypokalemia, decreasing CK, no leukocytosis UDS positive benzos and opiates  Resolved problems Acute Resp Failure  Assessment & Plan:   Acute Hyperactive Delirium  -He was on multiple psychoactive medications including Seroquel and Wellbutrin -UDS and head CT negative on admit, he denies history of drug abuse, 3/5 staff notes girlfriend visiting noted to be unsteady , suspicious for surreptitious drug use, UDS repeated 3/6 only shows benzos and opiates which are prescribed medicines -Obtain psych consult, holding all medications except Xanax for anxiety , rhabdo has been noted with both Wellbutrin and Seroquel     Aspiration pneumonitis/sphenoid sinusitis -He was on ceftriaxone, this was changed to cefazolin and Vanco overnight due to concern for septic shoulder, MRI does not show any evidence of septic arthritis, will DC vancomycin and continue cefazolin plan 7 days of antibiotics for sinusitis eventually can complete course with Augmentin   Chest pain for two weeks -Elevated troponin -No wall motion on echo is reassuring  AKI , rhabdomyolysis -Continue to hydrate aggressively with  fluids 150/hour and check serial bmet/ CK -Use Tylenol/codeine for moderate pain and fentanyl for severe pain (prefers fentanyl to Dilaudid) -No indication for dialysis yet, replete mild hypokalemia, urine output picking up so hopeful for renal recovery here  Bloody/coffee-ground NG aspirate -Hemoglobin stable, Lovenox restarted  3/5, gradual drop in hemoglobin from 12-9.6? Hemodilution Protonix twice daily Since hemoglobin is stable, will defer GI evaluation  Best practice (evaluated daily)  Diet: NPO Pain/Anxiety/Delirium protocol (if indicated):Fent as needed VAP protocol (if  indicated): yes DVT prophylaxis: lovenox GI prophylaxis: protonix Glucose control: Monitor blood sugar Mobility: bedrest Disposition:ICU  Goals of Care:   Code Status: Full  Labs   CBC: Recent Labs  Lab 10/27/20 0706 10/27/20 1655 10/28/20 0706 10/29/20 0346 10/30/20 0159  WBC 24.6* 17.6* 10.9* 11.3* 10.3  HGB 14.0 13.7 12.4* 11.3* 9.6*  HCT 39.8 39.8 36.7* 31.8* 27.0*  MCV 87.9 90.2 89.7 88.3 87.1  PLT 418* 406* 349 190 251    Basic Metabolic Panel: Recent Labs  Lab 10/26/20 1845 10/27/20 0629 10/27/20 0653 10/27/20 1142 10/27/20 1655 10/28/20 0706 10/28/20 1831 10/29/20 1047 10/30/20 0159  NA 133*  --  136  --   --  134* 131* 131* 129*  K 4.3  --  4.0  --   --  3.2* 3.0* 3.0* 3.2*  CL 96*  --   --   --   --  101 96* 90* 83*  CO2 25  --   --   --   --  17* 18* 23 27  GLUCOSE 138*  --   --   --   --  105* 155* 124* 110*  BUN 11  --   --   --   --  52* 56* 61* 64*  CREATININE 0.99 1.86*  --   --   --  5.53* 7.09* 8.88* 10.16*  CALCIUM 9.8  --   --   --   --  8.0* 7.6* 7.0* 6.5*  MG  --   --   --  4.3* 4.0* 3.6*  --  2.6* 2.4  PHOS  --   --   --  4.1 4.1 5.3*  --  6.0* 7.3*   GFR: Estimated Creatinine Clearance: 11.5 mL/min (A) (by C-G formula based on SCr of 10.16 mg/dL (H)). Recent Labs  Lab 10/27/20 0445 10/27/20 0701 10/27/20 0706 10/27/20 1142 10/27/20 1655 10/28/20 0706 10/29/20 0346 10/30/20 0159  WBC  --   --    < >  --  17.6* 10.9* 11.3* 10.3  LATICACIDVEN >11.0* 2.1*  --  2.0*  --   --   --   --    < > = values in this interval not displayed.    Liver Function Tests: Recent Labs  Lab 10/27/20 0527  AST 56*  ALT 25  ALKPHOS 62  BILITOT 1.0  PROT 8.5*  ALBUMIN 4.5   No results for input(s): LIPASE, AMYLASE in the last 168 hours. Recent Labs  Lab 10/27/20 1142  AMMONIA 51*    ABG    Component Value Date/Time   PHART 7.449 10/27/2020 0653   PCO2ART 36.1 10/27/2020 0653   PO2ART 357 (H) 10/27/2020 0653   HCO3 25.0  10/27/2020 0653   TCO2 26 10/27/2020 0653   O2SAT 100.0 10/27/2020 0653     Coagulation Profile: Recent Labs  Lab 10/27/20 0527  INR 1.1    Cardiac Enzymes: Recent Labs  Lab 10/27/20 0302 10/28/20 0706 10/29/20 1047 10/30/20 0159  CKTOTAL 141 32,777* 41,656* 28,110*    HbA1C: No results found for: HGBA1C  CBG: Recent Labs  Lab 10/29/20 1611 10/29/20 2047 10/30/20 0029 10/30/20 0454 10/30/20 0805  GLUCAP 128* 139* 102* 124* 100*       Critical care time: 32 m  Cyril Mourning MD. Tonny Bollman. [facility] Pager : [phone]  If no response to pager , please call [phone] until 7 pm After 7:00 pm call Elink  [phone]   10/30/2020

## 2020-10-30 NOTE — Progress Notes (Signed)
Post intubation chest x-ray reviewed  showing adequate placement of endotracheal tube and orogastric tube in adequate position

## 2020-10-30 NOTE — Progress Notes (Signed)
1730 patient RASS -4, barely responsive to sternal rub, BP 177/103. MD Dr. Vassie Loll paged. Orders for STAT head CT & 0.4 mg Narcan. Patient had a minimal reaction to the Narcan with eyes slightly opening after administration but still will not follow commands or maintain eye contact. Will continue to monitor.   Darrold Span, RN

## 2020-10-30 NOTE — Procedures (Signed)
Intubation Procedure Note  Andrew [name]  [id number]  06-Sep-1980  Date:10/30/20  Time:8:06 PM   Provider Performing:Kyuss Hale A Ayeden Gladman    Procedure: Intubation (31500)  Indication(s) Respiratory Failure  Consent Unable to obtain consent due to emergent nature of procedure.   Anesthesia Etomidate, Versed, Fentanyl and Rocuronium  100 mcg of fentanyl, 2 mg of Versed, 10 of etomidate on 40 of rocuronium-tolerated well  Time Out Verified patient identification, verified procedure, site/side was marked, verified correct patient position, special equipment/implants available, medications/allergies/relevant history reviewed, required imaging and test results available.   Sterile Technique Usual hand hygeine, masks, and gloves were used   Procedure Description Patient positioned in bed supine.  Sedation given as noted above.  Patient was intubated with endotracheal tube using Glidescope.  View was Grade 2 only posterior commissure .  Number of attempts was 1.  Colorimetric CO2 detector was consistent with tracheal placement.   Complications/Tolerance None; patient tolerated the procedure well. Chest X-ray is ordered to verify placement.   EBL none   Specimen(s) None

## 2020-10-30 NOTE — Progress Notes (Signed)
eLink Physician-Brief Progress Note Patient Name: Andrew [name] DOB: 1981/04/23 MRN: [id number]   Date of Service  10/30/2020  HPI/Events of Note  Agitation while intubated. RN requests increased sedation orders and restraints as patient was reaching for ETT.  On propofol 50 and fentanyl 200 mcg/hr patient is now starting to settle a bit more.   eICU Interventions  Ordered bilateral soft wrist restraints to maintain patient safety.  Increased upper limit of fentanyl to 300 mcg/hr.     Intervention Category Intermediate Interventions: Other:  Janae Bridgeman 10/30/2020, 9:05 PM

## 2020-10-30 NOTE — Progress Notes (Signed)
Called to evaluate patient at bedside  Breathing in the 40s and 50s-shallow, inefficient respiration Unresponsive  Mental status status continued to decline  Decision made to intubate

## 2020-10-30 NOTE — Progress Notes (Signed)
Assisted tele visit to patient with partner.  Andrew [name] M, RN

## 2020-10-30 NOTE — Progress Notes (Signed)
More confused per RN.  Hallucinating-someone is trying to kill him. On exam -in the chair, poor urine output since morning, intermittently follows one-step commands, muttering , grossly nonfocal  Likely delirium, worsened by pain medications in setting of renal failure  Recommend -use narcotics sparingly -Hold Xanax -If worse, or focal deficit will obtain head CT -Increase fluids to 200/h  Additional critical care time x 15 m  Andrew [name] V. Vassie Loll MD

## 2020-10-30 NOTE — Evaluation (Signed)
Occupational Therapy Evaluation Patient Details Name: Andrew [name] MRN: [id number] DOB: 1981/03/23 Today's Date: 10/30/2020    History of Present Illness 39y/o male admitted 3/2 with two weeks of chest pain.  While in the ER pt refusing care and became very combative with security involved. He then became very tachycardic with HR 180 and temp of 102.5.  Pt was emergently intubated 3/2- 3/4. EEG suggested moderate, diffuse encephalopathy without seizure activity. Found to be in rhabdo with AKI. RUE pain--MRI shoulder:Edema in musculature about the shoulder is worst in the  infraspinatus and teres minor and consistent with rhabdomyolysis or  infectious or inflammatory myositis  PMhx: PTSD, anxiety   Clinical Impression   This 40 yo male admitted with above presents to acute with PLOF per PT note yesterday of pt being independent with basic ADLs and mobility and did rather well with PT. Today he is lethargic, painful RUE, slow to process and slow to move thus making him max-total A +2 for all mobility and total A for all basic ADLs. He will continue to benefit from acute OT with follow up on CIR based off his change in abilities since yesterday. We will continue to follow.    Follow Up Recommendations  CIR;Supervision/Assistance - 24 hour    Equipment Recommendations  Other (comment) (TBD next venue)    Recommendations for Other Services Rehab consult     Precautions / Restrictions Precautions Precautions: Fall Precaution Comments: really painful RUE Restrictions Weight Bearing Restrictions: No      Mobility Bed Mobility Overal bed mobility: Needs Assistance Bed Mobility: Supine to Sit   Sidelying to sit: Max assist       General bed mobility comments: A for legs and trunk    Transfers Overall transfer level: Needs assistance Equipment used: Rolling walker (2 wheeled) Transfers: Sit to/from [employer] Sit to Stand: Max assist;+2 physical  assistance Stand pivot transfers: Max assist;+2 physical assistance       General transfer comment: VCs for all movements, increased time to come up to stand and to turn pivot with wanting to sit prematurely and needing A to guide hips    Balance Overall balance assessment: Needs assistance Sitting-balance support: Single extremity supported;Feet supported Sitting balance-Leahy Scale: Poor Sitting balance - Comments: posterior lean   Standing balance support: Single extremity supported Standing balance-Leahy Scale: Poor Standing balance comment: Needed Mod A +2 for standing balance once all the way upright                           ADL either performed or assessed with clinical judgement   ADL Overall ADL's : Needs assistance/impaired                                       General ADL Comments: Currently total A for all     Vision Baseline Vision/History:  (eyes closed most of session)              Pertinent Vitals/Pain Pain Assessment: 0-10 Pain Score: 10-Worst pain ever Pain Location: RUE Pain Descriptors / Indicators: Aching;Grimacing;Guarding;Moaning;Tightness Pain Intervention(s): Limited activity within patient's tolerance;Monitored during session;Repositioned     Hand Dominance Right   Extremity/Trunk Assessment Upper Extremity Assessment Upper Extremity Assessment: RUE deficits/detail;LUE deficits/detail RUE Deficits / Details: Pt not wanting to move his RUE at all, even with touching his arm he c/o  pain RUE Coordination: decreased fine motor;decreased gross motor LUE Deficits / Details: genralized weakness, but not c/o pain in this one LUE Coordination: decreased fine motor;decreased gross motor           Communication Communication Communication: No difficulties   Cognition Arousal/Alertness: Lethargic Behavior During Therapy: Flat affect Overall Cognitive Status: Impaired/Different from baseline Area of Impairment:  Orientation;Attention;Following commands;Safety/judgement;Awareness;Problem solving                 Orientation Level: Disoriented to;Place;Situation Current Attention Level: Focused   Following Commands: Follows one step commands inconsistently Safety/Judgement: Decreased awareness of safety;Decreased awareness of deficits Awareness: Intellectual Problem Solving: Slow processing;Decreased initiation;Difficulty sequencing;Requires verbal cues;Requires tactile cues General Comments: Pt with limited eye opening and when he did open them did not sustain long (10 seconds) even with sitting EOB--RN says he started with this lethargy yesterday afternoon              Home Living Family/patient expects to be discharged to:: Inpatient rehab Living Arrangements: Spouse/significant other Available Help at Discharge: Available 24 hours/day;Friend(s) Type of Home: House Home Access: Stairs to enter [employer] of Steps: 8   Home Layout: One level     Bathroom Shower/Tub: Chief Strategy Officer: Standard     Home Equipment: None          Prior Functioning/Environment Level of Independence: Independent        Comments: no falls in the last year, disabled veteran        OT Problem List: Decreased strength;Decreased range of motion;Decreased activity tolerance;Impaired balance (sitting and/or standing);Impaired vision/perception;Decreased coordination;Decreased cognition;Decreased safety awareness;Decreased knowledge of use of DME or AE;Increased edema;Pain;Impaired UE functional use      OT Treatment/Interventions: Self-care/ADL training;DME and/or AE instruction;Patient/family education;Balance training;Cognitive remediation/compensation;Therapeutic activities;Therapeutic exercise    OT Goals(Current goals can be found in the care plan section) Acute Rehab OT Goals Patient Stated Goal: Pt unable to state today OT Goal Formulation: Patient unable to  participate in goal setting Time For Goal Achievement: 11/13/20 Potential to Achieve Goals: Good  OT Frequency: Min 2X/week              AM-PAC OT "6 Clicks" Daily Activity     Outcome Measure Help from another person eating meals?: Total Help from another person taking care of personal grooming?: Total Help from another person toileting, which includes using toliet, bedpan, or urinal?: Total Help from another person bathing (including washing, rinsing, drying)?: Total Help from another person to put on and taking off regular upper body clothing?: Total Help from another person to put on and taking off regular lower body clothing?: Total 6 Click Score: 6   End of Session Equipment Utilized During Treatment: Gait belt Nurse Communication:  (RNs in room A'ing me with pt for transfers and for peri cleaning post bowel movement)  Activity Tolerance: Patient limited by lethargy;Patient limited by pain Patient left: in chair;with call bell/phone within reach;with chair alarm set;with nursing/sitter in room (maxi sky lift pad under him)  OT Visit Diagnosis: Unsteadiness on feet (R26.81);Other abnormalities of gait and mobility (R26.89);Muscle weakness (generalized) (M62.81);Pain;Other symptoms and signs involving cognitive function Pain - Right/Left: Right Pain - part of body: Arm                Time: 1937-9024 OT Time Calculation (min): 37 min Charges:  OT General Charges $OT Visit: 1 Visit OT Evaluation $OT Eval Moderate Complexity: 1 Mod OT Treatments $Self Care/Home Management : 8-22  mins  Ignacia Palma, OTR/L Acute Rehab Services Pager [phone] Office [phone]    Evette Georges 10/30/2020, 12:40 PM

## 2020-10-31 ENCOUNTER — Inpatient Hospital Stay (HOSPITAL_COMMUNITY): Payer: Medicaid Other

## 2020-10-31 DIAGNOSIS — G9341 Metabolic encephalopathy: Secondary | ICD-10-CM

## 2020-10-31 LAB — CBC
HCT: 30 % — ABNORMAL LOW (ref 39.0–52.0)
Hemoglobin: 10.3 g/dL — ABNORMAL LOW (ref 13.0–17.0)
MCH: 30.4 pg (ref 26.0–34.0)
MCHC: 34.3 g/dL (ref 30.0–36.0)
MCV: 88.5 fL (ref 80.0–100.0)
Platelets: 311 10*3/uL (ref 150–400)
RBC: 3.39 MIL/uL — ABNORMAL LOW (ref 4.22–5.81)
RDW: 12.3 % (ref 11.5–15.5)
WBC: 10.9 10*3/uL — ABNORMAL HIGH (ref 4.0–10.5)
nRBC: 0 % (ref 0.0–0.2)

## 2020-10-31 LAB — CSF CELL COUNT WITH DIFFERENTIAL
RBC Count, CSF: 1 /mm3 — ABNORMAL HIGH
RBC Count, CSF: 2 /mm3 — ABNORMAL HIGH
Tube #: 1
Tube #: 4
WBC, CSF: 2 /mm3 (ref 0–5)
WBC, CSF: 1 /mm3 (ref 0–5)

## 2020-10-31 LAB — TRIGLYCERIDES: Triglycerides: 319 mg/dL — ABNORMAL HIGH (ref <150)

## 2020-10-31 LAB — GLUCOSE, CAPILLARY
Glucose-Capillary: 108 mg/dL — ABNORMAL HIGH (ref 70–99)
Glucose-Capillary: 97 mg/dL (ref 70–99)
Glucose-Capillary: 107 mg/dL — ABNORMAL HIGH (ref 70–99)
Glucose-Capillary: 96 mg/dL (ref 70–99)
Glucose-Capillary: 113 mg/dL — ABNORMAL HIGH (ref 70–99)

## 2020-10-31 LAB — MAGNESIUM: Magnesium: 2.2 mg/dL (ref 1.7–2.4)

## 2020-10-31 LAB — BASIC METABOLIC PANEL
Anion gap: 22 — ABNORMAL HIGH (ref 5–15)
BUN: 72 mg/dL — ABNORMAL HIGH (ref 6–20)
CO2: 22 mmol/L (ref 22–32)
Calcium: 6.2 mg/dL — CL (ref 8.9–10.3)
Chloride: 86 mmol/L — ABNORMAL LOW (ref 98–111)
Creatinine, Ser: 11.69 mg/dL — ABNORMAL HIGH (ref 0.61–1.24)
GFR, Estimated: 5 mL/min — ABNORMAL LOW (ref >60)
Glucose, Bld: 86 mg/dL (ref 70–99)
Potassium: 3.3 mmol/L — ABNORMAL LOW (ref 3.5–5.1)
Sodium: 130 mmol/L — ABNORMAL LOW (ref 135–145)

## 2020-10-31 LAB — GLOMERULAR BASEMENT MEMBRANE ANTIBODIES: GBM Ab: 4 units (ref 0–20)

## 2020-10-31 LAB — PROTEIN AND GLUCOSE, CSF
Glucose, CSF: 58 mg/dL (ref 40–70)
Total  Protein, CSF: 127 mg/dL — ABNORMAL HIGH (ref 15–45)

## 2020-10-31 LAB — ALBUMIN: Albumin: 2.8 g/dL — ABNORMAL LOW (ref 3.5–5.0)

## 2020-10-31 LAB — ANTI-DNA ANTIBODY, DOUBLE-STRANDED: ds DNA Ab: <1 IU/mL (ref 0–9)

## 2020-10-31 LAB — PROCALCITONIN: Procalcitonin: 3.94 ng/mL

## 2020-10-31 LAB — VANCOMYCIN, RANDOM: Vancomycin Rm: 29

## 2020-10-31 LAB — PHOSPHORUS: Phosphorus: 7 mg/dL — ABNORMAL HIGH (ref 2.5–4.6)

## 2020-10-31 LAB — AMMONIA: Ammonia: 37 umol/L — ABNORMAL HIGH (ref 9–35)

## 2020-10-31 MED ORDER — DOCUSATE SODIUM 50 MG/5ML PO LIQD
100.0000 mg | Freq: Two times a day (BID) | ORAL | Status: DC | PRN
  Administered 2020-11-05 – 2020-11-06 (×2): 100 mg
  Filled 2020-10-31 (×2): qty 10

## 2020-10-31 MED ORDER — HYDRALAZINE HCL 20 MG/ML IJ SOLN
10.0000 mg | INTRAMUSCULAR | Status: DC | PRN
  Administered 2020-10-31 – 2020-11-02 (×8): 20 mg via INTRAVENOUS
  Administered 2020-11-03: 10 mg via INTRAVENOUS
  Administered 2020-11-04 – 2020-11-13 (×7): 20 mg via INTRAVENOUS
  Filled 2020-10-31 (×16): qty 1

## 2020-10-31 MED ORDER — VITAL HIGH PROTEIN PO LIQD
1000.0000 mL | ORAL | Status: DC
  Administered 2020-10-31 (×2): 1000 mL

## 2020-10-31 MED ORDER — CALCIUM GLUCONATE-NACL 2-0.675 GM/100ML-% IV SOLN
2.0000 g | Freq: Once | INTRAVENOUS | Status: AC
  Administered 2020-10-31: 2000 mg via INTRAVENOUS
  Filled 2020-10-31: qty 100

## 2020-10-31 MED ORDER — SODIUM CHLORIDE 0.9 % IV SOLN
2.0000 g | Freq: Two times a day (BID) | INTRAVENOUS | Status: DC
  Administered 2020-10-31 (×2): 2 g via INTRAVENOUS
  Filled 2020-10-31 (×2): qty 20

## 2020-10-31 MED ORDER — DEXMEDETOMIDINE HCL IN NACL 400 MCG/100ML IV SOLN
0.0000 ug/kg/h | INTRAVENOUS | Status: AC
  Administered 2020-10-31: 0.8 ug/kg/h via INTRAVENOUS
  Administered 2020-10-31: 0.4 ug/kg/h via INTRAVENOUS
  Administered 2020-11-01 (×2): 1.2 ug/kg/h via INTRAVENOUS
  Administered 2020-11-01: 0.6 ug/kg/h via INTRAVENOUS
  Administered 2020-11-01: 1.2 ug/kg/h via INTRAVENOUS
  Administered 2020-11-01 – 2020-11-02 (×7): 1 ug/kg/h via INTRAVENOUS
  Administered 2020-11-02: 1.2 ug/kg/h via INTRAVENOUS
  Administered 2020-11-02 – 2020-11-03 (×3): 1 ug/kg/h via INTRAVENOUS
  Filled 2020-10-31 (×20): qty 100

## 2020-10-31 MED ORDER — VITAMIN B-12 1000 MCG PO TABS
1000.0000 ug | ORAL_TABLET | Freq: Every day | ORAL | Status: DC
  Administered 2020-11-01: 1000 ug via ORAL
  Filled 2020-10-31: qty 1

## 2020-10-31 MED ORDER — VANCOMYCIN VARIABLE DOSE PER UNSTABLE RENAL FUNCTION (PHARMACIST DOSING): Status: DC

## 2020-10-31 MED ORDER — POLYETHYLENE GLYCOL 3350 17 G PO PACK
17.0000 g | PACK | Freq: Every day | ORAL | Status: DC | PRN
  Administered 2020-11-05 – 2020-11-06 (×2): 17 g
  Filled 2020-10-31 (×2): qty 1

## 2020-10-31 MED ORDER — POTASSIUM CHLORIDE 20 MEQ PO PACK
40.0000 meq | PACK | Freq: Once | ORAL | Status: AC
  Administered 2020-10-31: 40 meq
  Filled 2020-10-31: qty 2

## 2020-10-31 MED ORDER — VITAL AF 1.2 CAL PO LIQD
1000.0000 mL | ORAL | Status: DC
  Administered 2020-10-31: 1000 mL

## 2020-10-31 MED ORDER — ADULT MULTIVITAMIN W/MINERALS CH
1.0000 | ORAL_TABLET | Freq: Every day | ORAL | Status: DC
  Administered 2020-10-31: 1
  Filled 2020-10-31 (×2): qty 1

## 2020-10-31 NOTE — Progress Notes (Addendum)
eLink Physician-Brief Progress Note Patient Name: Andrew [name] DOB: Jan 18, 1981 MRN: [id number]   Date of Service  10/31/2020  HPI/Events of Note  BP 190/115  eICU Interventions  Ordered hydralazine 10-20mg  IV push Q4H PRN SBP > 170   ADDENDUM: - Low calcium, but in the setting of renal failure and high phos. - Not on pressors or exhibiting signs of hypocalemia. - Defer repletion at this time.  Intervention Category Major Interventions: Hypertension - evaluation and management  Marveen Reeks Arun Herrod 10/31/2020, 3:29 AM

## 2020-10-31 NOTE — Consult Note (Addendum)
Neurology Consultation Reason for Consult: New onset hypoactive delirum Requesting Physician: Cyril Mourning, MD  CC: chest pain, AMS  History is obtained from: Chart and RN  HPI: Everest TSUGIO ELISON is a 40 y.o. male that presented to the ER with two weeks of chest pain and dizziness that started day of presentation with PMH of MDD and PTSD. Upon presentation patient became confused and combative and had to be chemically with Geodon and physically restrained by officers. Patient became tachycardic with HR 180 and temp of 102.5, patient was emergently intubated and taking to ICU, patient was extubated ,3/4, but then re-intubated after endorsing signs of hypoactive delirium with concern for patient's ability to protect his airway, 3/6. Patient CK was noted to be elevated >32,000and patient was diagnosed with rhabdo. UDS was negative at admission. Patient was noted to be on Wellbutrin and Seroquel for his MDD, a psych consult was placed and both medications were discontinued. Initial EEG was negative for epileptic activity. Nephrology is following patient to manage his AKI and patient was started on isotonic bicarb drip.  On exam today patient is able to follow simple commands and is able to shake and nod his head as well as give thumbs up to questions. Through this communication patient is able to clarify that he stopped taking his Seroquel and Wellbutrin within the last month, but had continued to take his Xanax. Patient was also able to communicate that his abdomin and chest were not hurting today, but appeared to continue to have muscle pain.  Collateral from Father: Patient has never been diagnosed with seizures nor has he had TBI. Patient was dx with pre-diabetes. Father has been concerned about excess pain pills use. Patient has denied to father, but father is aware that girlfriend uses the pills. Patient may have OD'd 4-5 years ago and paramedics had to pick him up. Patient was in the [employer] and has a  son in [place]. Father notes that the one year anniversary of mother's death just passed November 11, 2020 and is concerned that the patient may be having trouble coping. Father reports that in the past the patient would call and speak nonsensical and his fiance would be in the background also not making sense. Father is concerned it was substance use that is causing the patient to call and not make sense.   ROS: All other review of systems was negative except as noted in the HPI.    History reviewed. No pertinent past medical history.  History reviewed. No pertinent family history.  Social History:  has no history on file for tobacco use, alcohol use, and drug use. See HPI above for concerns about substance use from family  Exam: Current vital signs: BP (!) 154/94   Pulse 68   Temp 99.3 F (37.4 C) (Oral)   Resp 20   Ht 5\' 10"  (1.778 m)   Wt 107.9 kg   SpO2 97%   BMI 34.13 kg/m  Vital signs in last 24 hours: Temp:  [98.2 F (36.8 C)-99.9 F (37.7 C)] 99.3 F (37.4 C) (03/07 1128) Pulse Rate:  [58-102] 68 (03/07 1127) Resp:  [16-42] 20 (03/07 1127) BP: (125-208)/(81-172) 154/94 (03/07 1127) SpO2:  [93 %-100 %] 97 % (03/07 1127) FiO2 (%):  [40 %-100 %] 40 % (03/07 1127) Weight:  [107.9 kg] 107.9 kg (03/07 0500)   Physical Exam  Constitutional: Appears well-developed and well-nourished. Patient alert, upper extremities appear edematous. Psych: Affect appropriate to situation Eyes: No scleral injection HENT: No oropharyngeal obstruction.  MSK: no joint deformities.  Cardiovascular: Normal rate and regular rhythm.  Respiratory: Effort normal, non-labored breathing, still intubated GI: Soft.  No distension. There is no tenderness.  Skin: Warm dry and intact visible skin  Neuro: Mental Status: Patient is awake, alert, not able to assess orientation due to intubation. However, patient appears to be able to follow simple commands and aware of conversation.  Patient is unable to give  a clear and coherent history due to intubation. No signs of neglect Cranial Nerves: II: Visual Fields are full. Pupils are equal, round, and reactive to light.   III,IV, VI: EOMI without ptosis or diploplia.  V: Symmetric reaction to eyelash brush VII: Facial movement is symmetric in the upper face, lower face limited secondary to ET tube.  VIII: hearing is intact to voice XI/X: Gag and cough reflex is intact Reflexes 2+ Brachioradialis bilateral 2+ patellar bilaterally Plantars: Mute response.  Motor: Tone is normal. Bulk is difficult to assess due to to edema but likely normal. 4/5 strength was present in bilateral UE. 1/5 strength in BLE w/ attempts to move noted at the hip, significantly pain limited.  Sensory: Sensation is symmetric to light touch and temperature in the arms and legs.      I have reviewed labs in epic and the results pertinent to this consultation are: CBC Latest Ref Rng & Units 10/31/2020 10/30/2020 10/30/2020  WBC 4.0 - 10.5 K/uL 10.9(H) - 10.3  Hemoglobin 13.0 - 17.0 g/dL 10.3(L) 11.2(L) 9.6(L)  Hematocrit 39.0 - 52.0 % 30.0(L) 33.0(L) 27.0(L)  Platelets 150 - 400 K/uL 311 - 251   CMP Latest Ref Rng & Units 10/31/2020 10/30/2020 10/30/2020  Glucose 70 - 99 mg/dL 86 - 563(O)  BUN 6 - 20 mg/dL 75(I) - 43(P)  Creatinine 0.61 - 1.24 mg/dL 29.51(O) - 84.16(S)  Sodium 135 - 145 mmol/L 130(L) 129(L) 129(L)  Potassium 3.5 - 5.1 mmol/L 3.3(L) 3.6 3.2(L)  Chloride 98 - 111 mmol/L 86(L) - 83(L)  CO2 22 - 32 mmol/L 22 - 27  Calcium 8.9 - 10.3 mg/dL 6.2(LL) - 6.5(L)  Total Protein 6.5 - 8.1 g/dL - - -  Total Bilirubin 0.3 - 1.2 mg/dL - - -  Alkaline Phos 38 - 126 U/L - - -  AST 15 - 41 U/L - - -  ALT 0 - 44 U/L - - -    Protein and glucose, CSF : Protein 127 Glucose 58 (serum 96) CSF cell count: 1-2 RBC's w/ color less CSF, and clear appearance and 1-2 WBC CSF cx: WBC present, both PMN and mononuclear, no organisms seen  Ammonia: 51>37 Mg: 2.4 Phos:7.0 Lab  Results  Component Value Date   TSH 0.363 10/27/2020    Lab Results  Component Value Date   VITAMINB12 368 10/27/2020     I have reviewed the images obtained: MRI brain without any acute intracranial process  Impression:  Mr. Melucci is a 40 yo patient with a hx of MDD and PTSD. Patient continues to present with symptoms of rhabdo; however patient's delirium remains of concern. Patient has no known psychiatric hx of psychosis. Patient's father reports that he has noted bizarre behavior in the past and has been concerned that patient is ingesting illicit substances with the patient's girlfriend. Patient UDS was negative on presentation; however patient appeared to decompensate after a visit from his girlfriend. Family and staff have expressed concern that girlfriend may have given patient illicit substances that could not be differentiated in his repeat UDS. Staff  noted that patient's girlfriend seemed a bit unsteady when she came to visit. Patient initial EEG was non concerning, do not believe patient would benefit from repeat. Patient MRI was not concerning, but patient CSF noted elevated protein.   Elevated CSF protein in the context of normal glucose and WBCs with minimal RBC is a nonspecific finding.  Could be consistent with aseptic meningitis secondary to toxic insult or potentially a viral syndrome.  Viral infection can also be associated with rhabdomyolysis.  However given his clinical course of improving without antiviral treatment and minimal red blood cells on lumbar puncture as well as reassuring MRI brain, I do not think empiric coverage for HSV meningitis with acyclovir is indicated especially given his severe renal dysfunction at this time as the risks outweigh the benefits.  CSF sterilization by several days of antibiotic treatment prior to lumbar puncture, but again his clinical course of initially improving (not on full CNS penetrant doses of antibiotics), followed by worsening in  the setting of a visitor, followed by improvement again today  Patient clinical exam is improved today and his WBC continues to trend downward with antibiotics. At this time highest on the differential is rhabdo likely secondary to toxic substance exposure, less likely secondary to nonspecific viral syndrome  Recommendations: - F/u CSF cx - F/u VDRL,CSF - Low normal B12, adding on MMA and start empiric supplementation with 1000 mcg B12 daily orally - Consider visitor restriction - Neurology will continue to follow  [name], MD  PGY-1   Attending Neurologist's note:  I personally saw this patient, gathering history, performing the neurologic examination, reviewing relevant labs, personally reviewing relevant imaging including MRI brain, and formulated the assessment and plan, adding the note above for completeness and clarity to accurately reflect my thoughts  Brooke Dare MD-PhD [facility] [phone]  Total critical care time:  40 minutes independent of time spent by the resident   Critical care time was exclusive of separately billable procedures and treating other patients.   Critical care was necessary to treat or prevent imminent or life-threatening deterioration.   Critical care was time spent personally by me on the following activities: development of treatment plan with patient and/or surrogate as well as nursing, discussions with consultants/primary team, evaluation of patient's response to treatment, examination of patient, obtaining history from patient or surrogate, ordering and performing treatments and interventions, ordering and review of laboratory studies, ordering and review of radiographic studies, and re-evaluation of patient's condition as needed, as documented above.

## 2020-10-31 NOTE — Progress Notes (Signed)
Patient ID: Andrew [name], male   DOB: 05-19-1981, 40 y.o.   MRN: [id number] S: Worsening mentation yesterday somewhat acutely.  Resulted in intubation last night.  Now ventilated and relatively stable.  Urine output seems to be improving O:BP (!) 144/92   Pulse 67   Temp 99.7 F (37.6 C) (Oral)   Resp 20   Ht _0  (1.778 m)   Wt 107.9 kg   SpO2 99%   BMI 34.13 kg/m   Intake/Output Summary (Last 24 hours) at 10/31/2020 1017 Last data filed at 10/31/2020 1000 Gross per 24 hour  Intake 5482.21 ml  Output 875 ml  Net 4607.21 ml   Intake/Output: I/O last 3 completed shifts: In: 7297.4 [P.O.:200; I.V.:6617.6; IV Piggyback:479.8] Out: 1125 [Urine:1125]  Intake/Output this shift:  Total I/O In: 1100.2 [I.V.:792.7; NG/GT:95; IV Piggyback:212.5] Out: 185 [Urine:185] Weight change:  Gen: Sedated, nods to questioning CVS: RRR Resp: Lateral chest rise with no increased work of breathing Abd: distended, +BS, soft, NT Ext: 1+ pitting edema in the bilateral lower extremities at the ankles, warm and well perfused  Recent Labs  Lab 10/26/20 1845 10/27/20 0527 10/27/20 0629 10/27/20 0653 10/27/20 1142 10/27/20 1655 10/28/20 0706 10/28/20 1831 10/29/20 1047 10/30/20 0159 10/30/20 2104 10/31/20 0530  NA 133*  --   --  136  --   --  134* 131* 131* 129* 129* 130*  K 4.3  --   --  4.0  --   --  3.2* 3.0* 3.0* 3.2* 3.6 3.3*  CL 96*  --   --   --   --   --  101 96* 90* 83*  --  86*  CO2 25  --   --   --   --   --  17* 18* 23 27  --  22  GLUCOSE 138*  --   --   --   --   --  105* 155* 124* 110*  --  86  BUN 11  --   --   --   --   --  52* 56* 61* 64*  --  72*  CREATININE 0.99  --  1.86*  --   --   --  5.53* 7.09* 8.88* 10.16*  --  11.69*  ALBUMIN  --  4.5  --   --   --   --   --   --   --   --   --  2.8*  CALCIUM 9.8  --   --   --   --   --  8.0* 7.6* 7.0* 6.5*  --  6.2*  PHOS  --   --   --   --  4.1 4.1 5.3*  --  6.0* 7.3*  --  7.0*  AST  --  56*  --   --   --   --   --   --   --    --   --   --   ALT  --  25  --   --   --   --   --   --   --   --   --   --    Liver Function Tests: Recent Labs  Lab 10/27/20 0527 10/31/20 0530  AST 56*  --   ALT 25  --   ALKPHOS 62  --   BILITOT 1.0  --   PROT 8.5*  --   ALBUMIN 4.5 2.8*   No results for input(s):  LIPASE, AMYLASE in the last 168 hours. Recent Labs  Lab 10/27/20 1142  AMMONIA 51*   CBC: Recent Labs  Lab 10/27/20 1655 10/28/20 0706 10/29/20 0346 10/30/20 0159 10/30/20 2104 10/31/20 0530  WBC 17.6* 10.9* 11.3* 10.3  --  10.9*  HGB 13.7 12.4* 11.3* 9.6* 11.2* 10.3*  HCT 39.8 36.7* 31.8* 27.0* 33.0* 30.0*  MCV 90.2 89.7 88.3 87.1  --  88.5  PLT 406* 349 190 251  --  311   Cardiac Enzymes: Recent Labs  Lab 10/27/20 0302 10/28/20 0706 10/29/20 1047 10/30/20 0159  CKTOTAL 141 32,777* 41,656* 28,110*   CBG: Recent Labs  Lab 10/30/20 1635 10/30/20 2024 10/30/20 2336 10/31/20 0304 10/31/20 0751  GLUCAP 109* 119* 114* 108* 97    Iron Studies: No results for input(s): IRON, TIBC, TRANSFERRIN, FERRITIN in the last 72 hours. Studies/Results: DG Shoulder 1V Right  Result Date: 10/29/2020 CLINICAL DATA:  Posterior right shoulder pain focus that EXAM: RIGHT SHOULDER - 1 VIEW COMPARISON:  None. FINDINGS: No acute bony abnormality. Specifically, no fracture, subluxation, or dislocation. No radiographic evidence of osteomyelitis. Mild soft tissue swelling about the shoulder. Sheet-like radiodensity projecting over the proximal upper arm, nonspecific, suspect this is external to the patient. Recommend visual inspection. IMPRESSION: 1. Mild soft tissue swelling about the shoulder. No acute osseous abnormality or evidence of osteomyelitis. 2. Some extensive radiodensity projecting over the proximal upper arm, nonspecific, suspect this is external to the patient. Recommend visual inspection. Electronically Signed   By: Lovena Le M.D.   On: 10/29/2020 23:02   CT HEAD WO CONTRAST  Result Date:  10/30/2020 CLINICAL DATA:  Altered mental status. EXAM: CT HEAD WITHOUT CONTRAST TECHNIQUE: Contiguous axial images were obtained from the base of the skull through the vertex without intravenous contrast. COMPARISON:  October 27, 2020 FINDINGS: Brain: No evidence of acute infarction, hemorrhage, hydrocephalus, extra-axial collection or mass lesion/mass effect. Vascular: No hyperdense vessel or unexpected calcification. Skull: Normal. Negative for fracture or focal lesion. Sinuses/Orbits: Very mild proximal right ethmoid sinus mucosal thickening is noted. Other: None. IMPRESSION: No acute intracranial pathology. Electronically Signed   By: Virgina Norfolk M.D.   On: 10/30/2020 19:16   US RENAL  Result Date: 10/29/2020 CLINICAL DATA:  Acute renal insufficiency EXAM: RENAL / URINARY TRACT ULTRASOUND COMPLETE COMPARISON:  None. FINDINGS: Right Kidney: Renal measurements: 9.9 x 5.7 x 6.5 cm = volume: 190.6 mL. Echogenicity within normal limits. No mass or hydronephrosis visualized. Left Kidney: Renal measurements: 11.0 x 5.6 x 5.3 cm = volume: 169.0 mL. Echogenicity within normal limits. No mass or hydronephrosis visualized. Bladder: Decompressed with a Foley catheter. Other: None. IMPRESSION: 1. The right kidney is normal in appearance. 2. Visualization of the left kidney is limited but no abnormalities are seen. 3. The bladder is decompressed with a Foley catheter. Electronically Signed   By: Dorise Bullion III M.D   On: 10/29/2020 15:41   MR SHOULDER RIGHT WO CONTRAST  Result Date: 10/30/2020 CLINICAL DATA:  Patient admitted 10/27/2020 with acute delirium. Chest and right shoulder pain. Elevated CK. Question septic arthritis. EXAM: MRI OF THE RIGHT SHOULDER WITHOUT CONTRAST TECHNIQUE: Multiplanar, multisequence MR imaging of the shoulder was performed. No intravenous contrast was administered. COMPARISON:  Plain films right shoulder 10/29/2020. FINDINGS: The patient was unable to complete the study. Axial and  coronal T2 weighted imaging and coronal PD weighted imaging is provided. Rotator cuff:  Intact. Muscles: There is intense edema in the infraspinatus and teres minor muscles. Milder degree of  edema is seen in the deltoid, worse posteriorly. There is also some edema in the visualized short head of biceps. No focal fluid collection is seen within muscle. There is fluid tracking about fascial planes. Biceps long head:  Intact. Acromioclavicular Joint: Mild osteoarthritis. No joint effusion. There is some fluid in the subacromial/subdeltoid bursa. Glenohumeral Joint: There is no joint effusion. Cartilage surface is preserved. Labrum:  Intact. Bones: Marrow signal is negative. No evidence of osteomyelitis. No fracture or focal bony lesion. Other: None IMPRESSION: Edema in musculature about the shoulder is worst in the infraspinatus and teres minor and consistent with rhabdomyolysis or infectious or inflammatory myositis. No muscle or tendon tear is identified. No intramuscular abscess. Negative for septic joint or osteomyelitis. Electronically Signed   By: Inge Rise M.D.   On: 10/30/2020 09:44   Portable Chest x-ray  Result Date: 10/30/2020 CLINICAL DATA:  Endotracheal tube present. EXAM: PORTABLE CHEST 1 VIEW COMPARISON:  Chest radiograph 10/27/2020 FINDINGS: Endotracheal tube tip 3.1 cm from the carina. Tip and side port of the enteric tube below the diaphragm in the stomach. Stable heart size and mediastinal contours. Question of small bilateral pleural effusions. Developing ill-defined bilateral perihilar opacities. No pneumothorax. IMPRESSION: 1. Endotracheal tube tip 3.1 cm from the carina. Enteric tube in place. 2. Developing ill-defined bilateral perihilar opacities may represent atelectasis, pulmonary edema, or airspace disease/pneumonia. Electronically Signed   By: Keith Rake M.D.   On: 10/30/2020 20:30   . chlorhexidine gluconate (MEDLINE KIT)  15 mL Mouth Rinse BID  . Chlorhexidine Gluconate  Cloth  6 each Topical Daily  . docusate  100 mg Per Tube BID  . feeding supplement (VITAL HIGH PROTEIN)  1,000 mL Per Tube Q24H  . heparin injection (subcutaneous)  5,000 Units Subcutaneous Q8H  . mouth rinse  15 mL Mouth Rinse 10 times per day  . multivitamin with minerals  1 tablet Per Tube Daily  . pantoprazole (PROTONIX) IV  40 mg Intravenous Q12H  . polyethylene glycol  17 g Per Tube Daily    BMET    Component Value Date/Time   NA 130 (L) 10/31/2020 0530   K 3.3 (L) 10/31/2020 0530   CL 86 (L) 10/31/2020 0530   CO2 22 10/31/2020 0530   GLUCOSE 86 10/31/2020 0530   BUN 72 (H) 10/31/2020 0530   CREATININE 11.69 (H) 10/31/2020 0530   CALCIUM 6.2 (LL) 10/31/2020 0530   GFRNONAA 5 (L) 10/31/2020 0530   CBC    Component Value Date/Time   WBC 10.9 (H) 10/31/2020 0530   RBC 3.39 (L) 10/31/2020 0530   HGB 10.3 (L) 10/31/2020 0530   HCT 30.0 (L) 10/31/2020 0530   PLT 311 10/31/2020 0530   MCV 88.5 10/31/2020 0530   MCH 30.4 10/31/2020 0530   MCHC 34.3 10/31/2020 0530   RDW 12.3 10/31/2020 0530    Assessment/Plan: 1.  Severe AKI secondary to rhabdomyolysis: Creatinine seems to be plateauing and urine output is increasing.  Possible uremia given altered mental status but agree with primary team that this is likely multifactorial with uremia only contributing minimally. 1. renal US without obstruction.   2. GN serologies pending, unlikely 3. CK levels starting to decline and UOP improving. 4. Continue IV fluids with normal saline 5. Continue to hold off on dialysis for now given slow signs of improvement.  2. Acute hyperactive delirium -likely multifactorial.  Toxic metabolic encephalopathy possibly contributing to some extent.  Off on dialysis for now.  Primary team in agreement 3.  Acute rhabdomyolysis - on IVF's with peak CK of 41,656 yesterday and now downtrending.  Continue supportive care 4. Hypokalemia - replete and follow 5. Hyponatremia - due to #1. Continue to follow.   6. Hypocalcemia: Likely secondary to rhabdomyolysis.  Continue repletion IV 7. Hyperphosphatemia: Secondary to rhabdomyolysis.  Continue to monitor.  No need for medications at this time 8. Aspiration pneumonitis/sphenoid sinusitis - treated with ceftriaxone then changed to ancef/vanc due to concerns for septic shoulder.  MRI negative now back on ancef.   Donetta Potts, MD [employer] [phone]

## 2020-10-31 NOTE — Progress Notes (Signed)
PT Cancellation Note  Patient Details Name: Andrew [name] MRN: [id number] DOB: [birth year]   Cancelled Treatment:    Reason Eval/Treat Not Completed: Medical issues which prohibited therapy  Pt now intubated and sedated. Will follow and resume PT as appropriate.   Jerolyn Center, PT Pager [phone]   Zena Amos 10/31/2020, 1:35 PM

## 2020-10-31 NOTE — Progress Notes (Signed)
Patient on vent transported to MRI and back to 2M15 without complications.

## 2020-10-31 NOTE — Progress Notes (Addendum)
Nutrition Follow-up / Consult  DOCUMENTATION CODES:   Obesity unspecified  INTERVENTION:   Tube feeding via OG tube: Vital AF 1.2 at 20 ml/h. Advance to goal rate tomorrow: Vital AF 1.2 at 80 ml/h (1920 ml per day) to provide 2304 kcal, 144 gm protein, 1557 ml free water daily  NUTRITION DIAGNOSIS:   Inadequate oral intake related to inability to eat as evidenced by NPO status.  Ongoing   GOAL:   Patient will meet greater than or equal to 90% of their needs  Progressing   MONITOR:   PO intake,Supplement acceptance,Labs,Weight trends,I & O's  REASON FOR ASSESSMENT:   Consult Enteral/tube feeding initiation and management  ASSESSMENT:   40 year old male who presented to the ED on 3/2 with chest pain x 2 weeks and developed acute hyperactive delirium. No pertinent PMH. Pt intubated for airway protection.  Discussed patient in ICU rounds and with RN today. Patient required re-intubation last night. Received MD Consult for TF initiation and management. OG tube in place. Previously on a regular diet, meal intakes documented at 15-100%.  Patient is currently intubated on ventilator support MV: 12 L/min Temp (24hrs), Avg:99.1 F (37.3 C), Min:98.2 F (36.8 C), Max:99.9 F (37.7 C)  Propofol: currently off  Labs reviewed. Sodium 130, K 3.3, phos 7, corrected calcium 7.16, Trig 319 CBG: [phone]   Medications reviewed and include Colace, MVI with minerals, Miralax. Receiving calcium gluconate for repletion.  Weight 107.9 kg today up from 102 kg on admission I/O +15 L since admission   Diet Order:   Diet Order            Diet NPO time specified  Diet effective now                 EDUCATION NEEDS:   No education needs have been identified at this time  Skin:  Skin Assessment: Reviewed RN Assessment  Last BM:  3/6 type 6  Height:   Ht Readings from Last 1 Encounters:  10/31/20 5\' 10"  (1.778 m)    Weight:   Wt Readings from Last 1 Encounters:   10/31/20 107.9 kg    BMI:  Body mass index is 34.13 kg/m.  Estimated Nutritional Needs:   Kcal:  2305  Protein:  140-160 gm  Fluid:  >/= 2.2 L    2306, RD, LDN, CNSC Please refer to Amion for contact information.

## 2020-10-31 NOTE — Progress Notes (Signed)
Assisted tele visit to patient with partner.  Yanis Juma Ann, RN   

## 2020-10-31 NOTE — Procedures (Signed)
Lumbar Puncture Procedure Note  Andrew [name]  [id number]  1981/07/17  Date:10/31/20  Time:3:06 PM   Provider Performing:Rakesh V. Alva   Procedure: Lumbar Puncture (00938)  Indication(s) Rule out meningitis  Consent Risks of the procedure as well as the alternatives and risks of each were explained to the patient and/or caregiver.  Consent for the procedure was obtained and is signed in the bedside chart  Anesthesia Topical only with 1% lidocaine    Time Out Verified patient identification, verified procedure, site/side was marked, verified correct patient position, special equipment/implants available, medications/allergies/relevant history reviewed, required imaging and test results available.   Sterile Technique Maximal sterile technique including sterile barrier drape, hand hygiene, sterile gown, sterile gloves, mask, hair covering.    Procedure Description Using palpation, approximate location of L3-L4 space identified.   Lidocaine used to anesthetize skin and subcutaneous tissue overlying this area.  A 20g spinal needle was then used to access the subarachnoid space. Opening pressure:Not obtained. Closing pressure:Not obtained. 10 cc of clear CSF obtained.  Complications/Tolerance None; patient tolerated the procedure well.   EBL Minimal   Specimen(s) CSF   Rakesh V. Vassie Loll MD

## 2020-10-31 NOTE — Progress Notes (Signed)
Assisted tele visit to patient with partner.  Andrew [name] R Andrew Valcarcel, RN   

## 2020-10-31 NOTE — Progress Notes (Signed)
NAME:  Andrew [name], MRN:  [id number], DOB:  23-Jan-1981, LOS: 4 ADMISSION DATE:  10/26/2020,   Brief History:  40 y/o male that presented to the ER with chest pain & acute hyperactive delirium requiring mechanical ventilation.  Subsequently found to be in rhabdo with AKI   Past Medical History:  PTSD Depression   Significant Hospital Events:  3/4 extubated, AKI worse, found to be in rhabdo 3/5 Continues to have diffuse muscle pain, renal consult 3/6 increased body pain particularly right shoulder, concern for septic arthritis, MRI neg  Consults:  Renal Psych Neuro  Procedures:  ETT 3/3  >> 3/4         3/6 >>  Significant Diagnostic Tests:  Head CT >> sphenoid sinusitis Echo 3/3 >> nml LVEF, no WMA MRI right shoulder 3/6>> no evidence of septic joint, no effusion, severe edema and muscles around shoulder joint consistent with rhabdo  Head CT 3/6 >> neg  Micro Data:  COVID (-) Influenza (-) resp 3/3 >> GPC, GNR >>nml flora Blood 3/3 >> ng Blood 3/6 >>  Antimicrobials:  ceftx 3/3 >> 3/6 azithro 3/3 >> Cefazolin 3/6 >> vanc 3/6  X 1 dose    SUBJ  Progressive obtundation through the day yesterday eventually requiring intubation for airway protection No response to Narcan, head CT negative Urine output improving last 24 hours Low-grade febrile   Objective   Blood pressure (!) 140/91, pulse 72, temperature 99.7 F (37.6 C), temperature source Oral, resp. rate 20, height 5\' 10"  (1.778 m), weight 107.9 kg, SpO2 100 %.    Vent Mode: PRVC FiO2 (%):  [40 %-100 %] 40 % Set Rate:  [20 bmp] 20 bmp Vt Set:  [580 mL] 580 mL PEEP:  [5 cmH20] 5 cmH20 Plateau Pressure:  [20 cmH20] 20 cmH20   Intake/Output Summary (Last 24 hours) at 10/31/2020 12/31/2020 Last data filed at 10/31/2020 0800 Gross per 24 hour  Intake 4843.46 ml  Output 795 ml  Net 4048.46 ml   Filed Weights   10/28/20 0500 10/29/20 0500 10/31/20 0500  Weight: 102 kg 105.6 kg 107.9 kg     Examination: General: Well-built man, intubated, sedated on propofol and fentanyl HENT: No pallor, no icterus, no JVD Lungs: Bilateral ventilated breath sounds, no accessory muscle use Cardiovascular: RR tachy no murmur Abdomen: soft non distended Extremities: No edema/cyanosis/ clubbing unable to assess pain Neuro: Winces to stimulation, does not follow commands, RASS 0  Chest x-ray 3/7 independently reviewed, ET tube in position, mild perihilar infiltrates  Labs show worsening creatinine up to 11, hypokalemia , hyponatremia, stable anemia  Resolved problems   Assessment & Plan:   Presented with agitated delirium and rhabdo, suspecting Wellbutrin and Seroquel as culprits but surreptitious drug use being in the differential although UDS negative x2 -Initially improved and extubated but then intubated for airway protection due to worsening somnolence on 3/6  Acute Resp Failure -reintubated 3/6 -Vent settings reviewed and adjusted -Start spontaneous breathing trials -We'll wait for mental status to improve before extubating    Acute encephalopathy/hyperactive Delirium  -He was on multiple psychoactive medications including Seroquel and Wellbutrin -UDS and head CT negative on admit, he denies history of drug abuse, 3/5 staff notes girlfriend visiting noted to be unsteady , suspicious for surreptitious drug use, UDS repeated 3/6 only shows benzos and opiates which are prescribed medicines - holding all medications except Xanax for anxiety , rhabdo has been noted with both Wellbutrin and Seroquel -Switch to Precedex and fentanyl for  sedation, goal RASS 0 to -1 -Recheck ammonia, slightly elevated on admission -D/w neuro, proceed with MRI   Aspiration pneumonitis/sphenoid sinusitis Persistent low-grade fever -He was on ceftriaxone, this was changed to cefazolin and Vanco 3/6due to concern for septic shoulder, MRI does not show any evidence of septic arthritis, will DC vancomycin  and continue cefazolin plan 7 days of antibiotics for sinusitis eventually can complete course with Augmentin -Check procalcitonin, doubt we have to consider CNS infection here   AKI , rhabdomyolysis -Continue to hydrate aggressively with  fluids 200/hour and check serial bmet/ CK -Use  fentanyl for severe pain (prefers fentanyl to Dilaudid) -No indication for dialysis yet, replete mild hypokalemia, urine output picking up so hopeful for renal recovery here  Bloody/coffee-ground NG aspirate -Hemoglobin stable, Lovenox restarted 3/5, gradual drop in hemoglobin from 12-9.6, favor Hemodilution Protonix twice daily Since hemoglobin is stable, will defer GI evaluation  Best practice (evaluated daily)  Diet: NPO Pain/Anxiety/Delirium protocol (if indicated):Fent gtt + precedex VAP protocol (if indicated): yes DVT prophylaxis: lovenox GI prophylaxis: protonix Glucose control: Monitor blood sugar Mobility: bedrest Disposition:ICU  Goals of Care:   Code Status: Full  Labs   CBC: Recent Labs  Lab 10/27/20 1655 10/28/20 0706 10/29/20 0346 10/30/20 0159 10/30/20 2104 10/31/20 0530  WBC 17.6* 10.9* 11.3* 10.3  --  10.9*  HGB 13.7 12.4* 11.3* 9.6* 11.2* 10.3*  HCT 39.8 36.7* 31.8* 27.0* 33.0* 30.0*  MCV 90.2 89.7 88.3 87.1  --  88.5  PLT 406* 349 190 251  --  311    Basic Metabolic Panel: Recent Labs  Lab 10/27/20 1655 10/28/20 0706 10/28/20 1831 10/29/20 1047 10/30/20 0159 10/30/20 2104 10/31/20 0530  NA  --  134* 131* 131* 129* 129* 130*  K  --  3.2* 3.0* 3.0* 3.2* 3.6 3.3*  CL  --  101 96* 90* 83*  --  86*  CO2  --  17* 18* 23 27  --  22  GLUCOSE  --  105* 155* 124* 110*  --  86  BUN  --  52* 56* 61* 64*  --  72*  CREATININE  --  5.53* 7.09* 8.88* 10.16*  --  11.69*  CALCIUM  --  8.0* 7.6* 7.0* 6.5*  --  6.2*  MG 4.0* 3.6*  --  2.6* 2.4  --  2.2  PHOS 4.1 5.3*  --  6.0* 7.3*  --  7.0*   GFR: Estimated Creatinine Clearance: 10.1 mL/min (A) (by C-G formula based  on SCr of 11.69 mg/dL (H)). Recent Labs  Lab 10/27/20 0445 10/27/20 0701 10/27/20 0706 10/27/20 1142 10/27/20 1655 10/28/20 0706 10/29/20 0346 10/30/20 0159 10/31/20 0530  WBC  --   --    < >  --    < > 10.9* 11.3* 10.3 10.9*  LATICACIDVEN >11.0* 2.1*  --  2.0*  --   --   --   --   --    < > = values in this interval not displayed.    Liver Function Tests: Recent Labs  Lab 10/27/20 0527  AST 56*  ALT 25  ALKPHOS 62  BILITOT 1.0  PROT 8.5*  ALBUMIN 4.5   No results for input(s): LIPASE, AMYLASE in the last 168 hours. Recent Labs  Lab 10/27/20 1142  AMMONIA 51*    ABG    Component Value Date/Time   PHART 7.387 10/30/2020 2104   PCO2ART 58.0 (H) 10/30/2020 2104   PO2ART 529 (H) 10/30/2020 2104   HCO3 34.6 (  H) 10/30/2020 2104   TCO2 36 (H) 10/30/2020 2104   O2SAT 100.0 10/30/2020 2104     Coagulation Profile: Recent Labs  Lab 10/27/20 0527  INR 1.1    Cardiac Enzymes: Recent Labs  Lab 10/27/20 0302 10/28/20 0706 10/29/20 1047 10/30/20 0159  CKTOTAL 141 32,777* 41,656* 28,110*    HbA1C: No results found for: HGBA1C  CBG: Recent Labs  Lab 10/30/20 1635 10/30/20 2024 10/30/20 2336 10/31/20 0304 10/31/20 0751  GLUCAP 109* 119* 114* 108* 97       Critical care time: 40 m    Cyril Mourning MD. FCCP. [facility] Pager : [phone]  If no response to pager , please call [phone] until 7 pm After 7:00 pm call Elink  [phone]   10/31/2020

## 2020-10-31 NOTE — Progress Notes (Signed)
Plan of Care:   --Discussed with Dr. Vassie Loll this morning about patient's current situation.   --No need of psych evaluation at this point as patient is medically unstable and on sedation and intubated. Patient is unable to participate for psych evaluation. --Primary team is planning to consult neurology --Psychiatry is signing off for now but please re-consult, if needed.  Arnoldo Lenis, MD PGY-1, Resident

## 2020-10-31 NOTE — Progress Notes (Addendum)
Pharmacy Antibiotic Note  Andrew [name] is a 40 y.o. male admitted on 10/26/2020 with chest pain and hyperactive delirium found to be in rhabdomyolysis. Patient was started on ceftriaxone and changed to cefazolin and vancomycin for concern of septic arthritis on 3/5 PM but MRI negative and changed to cefazolin for 7 days for sinusitis on 3/6. Today, pharmacy has been consulted for vancomycin dosing for meningitis given patient reintubated yesterday pending ID workup. Changed from cefazolin to ceftriaxone 2g q12h. Scr 11.69 - increasing. CrCl ~10 ml.min. Patient received vancomycin 2000mg  loading dose on 3/5 ~2330. Tmax 100.5. WBC 10.9.   Plan: Obtain vancomycin random level due to loading dose administered on 3/5 PM and impaired renal function.   Monitor renal function, cultures, and clinical progression  Height: 5\' 10"  (177.8 cm) Weight: 107.9 kg (237 lb 14 oz) IBW/kg (Calculated) : 73  Temp (24hrs), Avg:99.4 F (37.4 C), Min:98.2 F (36.8 C), Max:100.5 F (38.1 C)  Recent Labs  Lab 10/27/20 0445 10/27/20 0629 10/27/20 0701 10/27/20 0706 10/27/20 1142 10/27/20 1655 10/28/20 0706 10/28/20 1831 10/29/20 0346 10/29/20 1047 10/30/20 0159 10/31/20 0530  WBC  --   --   --    < >  --  17.6* 10.9*  --  11.3*  --  10.3 10.9*  CREATININE  --    < >  --   --   --   --  5.53* 7.09*  --  8.88* 10.16* 11.69*  LATICACIDVEN >11.0*  --  2.1*  --  2.0*  --   --   --   --   --   --   --    < > = values in this interval not displayed.    Estimated Creatinine Clearance: 10.1 mL/min (A) (by C-G formula based on SCr of 11.69 mg/dL (H)).    No Known Allergies  Antimicrobials this admission: Azithromycin x1 3/3  Ceftriaxone 3/3 >> 3/5; 3/7 >> Vancomycin 200mg  x1 3/5; 3/7 >>  Cefazolin 3/6 >> 3/7  Dose adjustments this admission: N/a  Microbiology results: 3/3 MRSA PCR negative 3/3 BCx: ngtd  3/3 TA: normal flora 3/6 BCx: ngtd    Thank you for allowing pharmacy to be a part of this  patient's care.  , PharmD Clinical Pharmacist  10/31/2020 8:51 AM   Addendum: Vancomycin random level 29. Will not redose vancomycin since level > 20.   Plan: - Recheck vancomycin random level tomorrow morning

## 2020-11-01 LAB — BASIC METABOLIC PANEL
Anion gap: 21 — ABNORMAL HIGH (ref 5–15)
Anion gap: 18 — ABNORMAL HIGH (ref 5–15)
BUN: 81 mg/dL — ABNORMAL HIGH (ref 6–20)
BUN: 83 mg/dL — ABNORMAL HIGH (ref 6–20)
CO2: 17 mmol/L — ABNORMAL LOW (ref 22–32)
CO2: 20 mmol/L — ABNORMAL LOW (ref 22–32)
Calcium: 6.7 mg/dL — ABNORMAL LOW (ref 8.9–10.3)
Calcium: 6.9 mg/dL — ABNORMAL LOW (ref 8.9–10.3)
Chloride: 94 mmol/L — ABNORMAL LOW (ref 98–111)
Chloride: 97 mmol/L — ABNORMAL LOW (ref 98–111)
Creatinine, Ser: 11.94 mg/dL — ABNORMAL HIGH (ref 0.61–1.24)
Creatinine, Ser: 11.51 mg/dL — ABNORMAL HIGH (ref 0.61–1.24)
GFR, Estimated: 5 mL/min — ABNORMAL LOW (ref >60)
GFR, Estimated: 5 mL/min — ABNORMAL LOW (ref >60)
Glucose, Bld: 108 mg/dL — ABNORMAL HIGH (ref 70–99)
Glucose, Bld: 117 mg/dL — ABNORMAL HIGH (ref 70–99)
Potassium: 4 mmol/L (ref 3.5–5.1)
Potassium: 4 mmol/L (ref 3.5–5.1)
Sodium: 132 mmol/L — ABNORMAL LOW (ref 135–145)
Sodium: 135 mmol/L (ref 135–145)

## 2020-11-01 LAB — GLUCOSE, CAPILLARY
Glucose-Capillary: 104 mg/dL — ABNORMAL HIGH (ref 70–99)
Glucose-Capillary: 111 mg/dL — ABNORMAL HIGH (ref 70–99)
Glucose-Capillary: 115 mg/dL — ABNORMAL HIGH (ref 70–99)
Glucose-Capillary: 118 mg/dL — ABNORMAL HIGH (ref 70–99)
Glucose-Capillary: 119 mg/dL — ABNORMAL HIGH (ref 70–99)
Glucose-Capillary: 121 mg/dL — ABNORMAL HIGH (ref 70–99)
Glucose-Capillary: 121 mg/dL — ABNORMAL HIGH (ref 70–99)

## 2020-11-01 LAB — VANCOMYCIN, RANDOM: Vancomycin Rm: 24

## 2020-11-01 LAB — CULTURE, BLOOD (ROUTINE X 2)
Culture: NO GROWTH
Culture: NO GROWTH

## 2020-11-01 LAB — PROCALCITONIN: Procalcitonin: 3.92 ng/mL

## 2020-11-01 LAB — MPO/PR-3 (ANCA) ANTIBODIES
ANCA Proteinase 3: <3.5 U/mL (ref 0.0–3.5)
Myeloperoxidase Abs: <9 U/mL (ref 0.0–9.0)

## 2020-11-01 LAB — PHOSPHORUS: Phosphorus: 7 mg/dL — ABNORMAL HIGH (ref 2.5–4.6)

## 2020-11-01 LAB — VDRL, CSF: VDRL Quant, CSF: NONREACTIVE

## 2020-11-01 LAB — COMPLEMENT, TOTAL: Compl, Total (CH50): >60 U/mL (ref >41)

## 2020-11-01 LAB — MAGNESIUM: Magnesium: 2.2 mg/dL (ref 1.7–2.4)

## 2020-11-01 MED ORDER — VITAMIN B-12 1000 MCG PO TABS
1000.0000 ug | ORAL_TABLET | Freq: Every day | ORAL | Status: DC
  Administered 2020-11-02 – 2020-11-08 (×7): 1000 ug
  Filled 2020-11-01 (×7): qty 1

## 2020-11-01 MED ORDER — VITAL AF 1.2 CAL PO LIQD: 1000.0000 mL | ORAL | Status: DC

## 2020-11-01 MED ORDER — AMLODIPINE BESYLATE 10 MG PO TABS
10.0000 mg | ORAL_TABLET | Freq: Every day | ORAL | Status: DC
  Administered 2020-11-01 – 2020-11-07 (×7): 10 mg
  Filled 2020-11-01 (×8): qty 1

## 2020-11-01 MED ORDER — ADULT MULTIVITAMIN LIQUID CH
15.0000 mL | Freq: Every day | ORAL | Status: DC
  Administered 2020-11-02 – 2020-11-07 (×6): 15 mL
  Filled 2020-11-01 (×8): qty 15

## 2020-11-01 MED ORDER — SODIUM CHLORIDE 0.9 % IV SOLN
2.0000 g | INTRAVENOUS | Status: AC
  Administered 2020-11-01 – 2020-11-02 (×2): 2 g via INTRAVENOUS
  Filled 2020-11-01 (×2): qty 20

## 2020-11-01 NOTE — Progress Notes (Signed)
NAME:  Andrew [name], MRN:  [id number], DOB:  [birth year], LOS: 5 ADMISSION DATE:  10/26/2020,   Brief History:  40 y/o male that presented to the ER with chest pain & acute hyperactive delirium requiring mechanical ventilation.  Subsequently found to be in rhabdo with AKI   Past Medical History:  PTSD Depression   Significant Hospital Events:  3/4 extubated, AKI worse, found to be in rhabdo 3/5 Continues to have diffuse muscle pain, renal consult 3/6 increased body pain particularly right shoulder, concern for septic arthritis, MRI neg  Consults:  Renal Psych Neuro  Procedures:  ETT 3/3  >> 3/4         3/6 >>  Significant Diagnostic Tests:  Head CT >> sphenoid sinusitis Echo 3/3 >> nml LVEF, no WMA MRI right shoulder 3/6>> no evidence of septic joint, no effusion, severe edema and muscles around shoulder joint consistent with rhabdo  Head CT 3/6 >> neg CSF cell count 3/7 >> WBC 2, RBC 2 (hypocellular) MRI brain 3/7 >> normal, paranasal sinus mucosal thickening, small mastoid effusions  Micro Data:  COVID (-) Influenza (-) resp 3/3 >> GPC, GNR >>nml flora Blood 3/3 >> Blood 3/6 >> CSF 3/7 >>   Antimicrobials:  ceftx 3/3 >> 3/6 azithro 3/3 x 1 Cefazolin 3/6 >> 3/7 Ceftriaxone 3/7 >>  vanc 3/6  X 1 dose vanco 3/7 >> 3/8   SUBJ  MRI, LP as above I/O+ 18.9 L total, urine output 1455 cc last 24 hours BMP pending this morning   Objective   Blood pressure (!) 180/98, pulse 68, temperature 98.6 F (37 C), temperature source Oral, resp. rate 18, height 5\' 10"  (1.778 m), weight 114.7 kg, SpO2 100 %.    Vent Mode: PRVC FiO2 (%):  [40 %] 40 % Set Rate:  [20 bmp] 20 bmp Vt Set:  [580 mL] 580 mL PEEP:  [5 cmH20] 5 cmH20 Plateau Pressure:  [27 cmH20-33 cmH20] 29 cmH20   Intake/Output Summary (Last 24 hours) at 11/01/2020 0748 Last data filed at 11/01/2020 0700 Gross per 24 hour  Intake 6337.8 ml  Output 1555 ml  Net 4782.8 ml   Filed Weights   10/29/20  0500 10/31/20 0500 11/01/20 0500  Weight: 105.6 kg 107.9 kg 114.7 kg    Examination: General: Obese man, ventilated, no distress HENT: ET tube in place, OG tube in place, pupils equal, oropharynx otherwise clear, moist Lungs: Distant, no crackles, no wheezes Cardiovascular: Regular, distant, tachycardic, no murmur Abdomen: Somewhat distended, tympanic, nontender, positive bowel sounds Extremities: No significant edema Neuro: Eyes open, nods to questions, follows commands   Resolved problems   Assessment & Plan:   Presented with agitated delirium and rhabdo, suspecting Wellbutrin and Seroquel as culprits but surreptitious drug use being in the differential although UDS negative x2.  No evidence occult seizure activity -Initially improved and extubated but then intubated for airway protection due to worsening somnolence on 3/6  Acute Resp Failure -reintubated 3/6 -Continue mechanical ventilation PRVC.  Push for SBT/PSV when mental status will allow -Continue to correct underlying contributors to his delirium as below -VAP prevention orders -Pulmonary hygiene  Acute encephalopathy/hyperactive Delirium  He was on multiple psychoactive medications including Seroquel and Wellbutrin UDS and head CT negative on admit, he denies history of drug abuse, 3/5 staff notes girlfriend visiting noted to be unsteady , suspicious for surreptitious drug use, UDS repeated 3/6 only shows benzos and opiates which are prescribed medicines MRI brain, CSF reassuring thus far -Continue to  correct underlying metabolic disarray -Continue to hold Wellbutrin, Seroquel.  He does have anxiety and Xanax available -PAD with Precedex, fentanyl infusions -Ammonia 51 >> 37 -We will obtain repeat psych consult when patient is awake and able to participate -Antibiotics were broadened for possible meningitis 3/7, back to ceftriaxone and vancomycin.  Given his reassuring CSF I will stop vancomycin 3/8, finish 1 more day  of ceftriaxone to cover his sphenoid sinusitis as below -Follow CSF studies to completion -Neurology recommends B12 supplementation, holding off on empiric HSV coverage given reassuring MRI, CSF  Aspiration pneumonitis/sphenoid sinusitis -Plan to complete 7-day course antibiotics to address sinusitis, today is day 6 of 6, will finish 1 more day of ceftriaxone. -Follow procalcitonin and clinical status, culture data  Hypertension, not on home agents -Has hydralazine available as needed -Start amlodipine 10 mg on 3/8, consider addition of other agents if poor control especially as we wean his fentanyl  AKI , rhabdomyolysis Acute metabolic acidosis Cause unclear.  Question meds.  No overt seizure activity noted -CK peaked on 3/5, check again 3/9 to ensure no transient rise -Continue aggressive IV fluid resuscitation, NS 200 cc/h.  May be able to decrease rate, will discuss with nephrology 3/8 -Bicarbonate infusion completed/stopped -Appreciate nephrology assistance.  Hopefully will achieve renal recovery  Bloody/coffee-ground NG aspirate -Following CBC -PPI twice daily -On prophylactic subcutaneous heparin   Best practice (evaluated daily)  Diet: NPO Pain/Anxiety/Delirium protocol (if indicated):Fent gtt + precedex VAP protocol (if indicated): yes DVT prophylaxis: lovenox GI prophylaxis: protonix Glucose control: Monitor blood sugar Mobility: bedrest Disposition:ICU Family: reviewed status with the patient's father by phone 3/8.   Goals of Care:   Code Status: Full  Labs   CBC: Recent Labs  Lab 10/27/20 1655 10/28/20 0706 10/29/20 0346 10/30/20 0159 10/30/20 2104 10/31/20 0530  WBC 17.6* 10.9* 11.3* 10.3  --  10.9*  HGB 13.7 12.4* 11.3* 9.6* 11.2* 10.3*  HCT 39.8 36.7* 31.8* 27.0* 33.0* 30.0*  MCV 90.2 89.7 88.3 87.1  --  88.5  PLT 406* 349 190 251  --  311    Basic Metabolic Panel: Recent Labs  Lab 10/28/20 0706 10/28/20 1831 10/29/20 1047 10/30/20 0159  10/30/20 2104 10/31/20 0530 11/01/20 0152  NA 134* 131* 131* 129* 129* 130*  --   K 3.2* 3.0* 3.0* 3.2* 3.6 3.3*  --   CL 101 96* 90* 83*  --  86*  --   CO2 17* 18* 23 27  --  22  --   GLUCOSE 105* 155* 124* 110*  --  86  --   BUN 52* 56* 61* 64*  --  72*  --   CREATININE 5.53* 7.09* 8.88* 10.16*  --  11.69*  --   CALCIUM 8.0* 7.6* 7.0* 6.5*  --  6.2*  --   MG 3.6*  --  2.6* 2.4  --  2.2 2.2  PHOS 5.3*  --  6.0* 7.3*  --  7.0* 7.0*   GFR: Estimated Creatinine Clearance: 10.4 mL/min (A) (by C-G formula based on SCr of 11.69 mg/dL (H)). Recent Labs  Lab 10/27/20 0445 10/27/20 0701 10/27/20 0706 10/27/20 1142 10/27/20 1655 10/28/20 0706 10/29/20 0346 10/30/20 0159 10/31/20 0530 10/31/20 0949 11/01/20 0152  PROCALCITON  --   --   --   --   --   --   --   --   --  3.94 3.92  WBC  --   --    < >  --    < >  10.9* 11.3* 10.3 10.9*  --   --   LATICACIDVEN >11.0* 2.1*  --  2.0*  --   --   --   --   --   --   --    < > = values in this interval not displayed.    Liver Function Tests: Recent Labs  Lab 10/27/20 0527 10/31/20 0530  AST 56*  --   ALT 25  --   ALKPHOS 62  --   BILITOT 1.0  --   PROT 8.5*  --   ALBUMIN 4.5 2.8*   No results for input(s): LIPASE, AMYLASE in the last 168 hours. Recent Labs  Lab 10/27/20 1142 10/31/20 0949  AMMONIA 51* 37*    ABG    Component Value Date/Time   PHART 7.387 10/30/2020 2104   PCO2ART 58.0 (H) 10/30/2020 2104   PO2ART 529 (H) 10/30/2020 2104   HCO3 34.6 (H) 10/30/2020 2104   TCO2 36 (H) 10/30/2020 2104   O2SAT 100.0 10/30/2020 2104     Coagulation Profile: Recent Labs  Lab 10/27/20 0527  INR 1.1    Cardiac Enzymes: Recent Labs  Lab 10/27/20 0302 10/28/20 0706 10/29/20 1047 10/30/20 0159  CKTOTAL 141 32,777* 41,656* 28,110*    HbA1C: No results found for: HGBA1C  CBG: Recent Labs  Lab 10/31/20 1650 10/31/20 1938 11/01/20 0059 11/01/20 0329 11/01/20 0728  GLUCAP 96 113* 111* 104* 121*        Critical care time: 35 min    Levy Pupa, MD, PhD 11/01/2020, 8:11 AM [facility] [phone] or if no answer before 7:00PM call [phone] For any issues after 7:00PM please call eLink [phone]

## 2020-11-01 NOTE — Progress Notes (Signed)
Nutrition Brief Note  Patient is tolerating TF well at 20 ml/h. RN reports some abdominal distention. Will increase TF slowly to goal rate. Vital AF 1.2, increase by 10 ml every 4 hours to goal rate of 80 ml/h to provide 2304 kcal, 144 gm protein, 1557 ml free water daily.  Gabriel Rainwater, RD, LDN, CNSC Please refer to [facility] for contact information.

## 2020-11-01 NOTE — Progress Notes (Addendum)
Patient ID: Andrew [name], male   DOB: Sep 16, 1980, 40 y.o.   MRN: [id number] S: Mentation improved today.  Status post LP and MRI.  Neurology following.  Patient is more interactive today.  Urine output is improving. O:BP (!) 170/99   Pulse 82   Temp 98.6 F (37 C) (Oral)   Resp (!) 26   Ht _0  (1.778 m)   Wt 114.7 kg   SpO2 100%   BMI 36.28 kg/m   Intake/Output Summary (Last 24 hours) at 11/01/2020 0945 Last data filed at 11/01/2020 0900 Gross per 24 hour  Intake 6261.86 ml  Output 1370 ml  Net 4891.86 ml   Intake/Output: I/O last 3 completed shifts: In: 9312.1 [I.V.:8514.6; NG/GT:535; IV Piggyback:262.5] Out: 1985 [Urine:1885; Emesis/NG output:100]  Intake/Output this shift:  Total I/O In: 639.4 [I.V.:488.5; NG/GT:40; IV Piggyback:110.9] Out: -  Weight change: 6.8 kg Gen: Sedated, nods to questioning CVS: Normal rate Resp: Lateral chest rise with no increased work of breathing Abd: distended, +BS, soft, NT Ext: Trace edema in the bilateral lower extremities, warm and well perfused  Recent Labs  Lab 10/26/20 1845 10/27/20 0527 10/27/20 0629 10/27/20 0653 10/27/20 1142 10/27/20 1655 10/28/20 0706 10/28/20 1831 10/29/20 1047 10/30/20 0159 10/30/20 2104 10/31/20 0530 11/01/20 0152 11/01/20 0803  NA 133*  --   --    < >  --   --  134* 131* 131* 129* 129* 130*  --  132*  K 4.3  --   --    < >  --   --  3.2* 3.0* 3.0* 3.2* 3.6 3.3*  --  4.0  CL 96*  --   --   --   --   --  101 96* 90* 83*  --  86*  --  94*  CO2 25  --   --   --   --   --  17* 18* 23 27  --  22  --  17*  GLUCOSE 138*  --   --   --   --   --  105* 155* 124* 110*  --  86  --  108*  BUN 11  --   --   --   --   --  52* 56* 61* 64*  --  72*  --  81*  CREATININE 0.99  --  1.86*  --   --   --  5.53* 7.09* 8.88* 10.16*  --  11.69*  --  11.94*  ALBUMIN  --  4.5  --   --   --   --   --   --   --   --   --  2.8*  --   --   CALCIUM 9.8  --   --   --   --   --  8.0* 7.6* 7.0* 6.5*  --  6.2*  --  6.7*  PHOS   --   --   --   --  4.1 4.1 5.3*  --  6.0* 7.3*  --  7.0* 7.0*  --   AST  --  56*  --   --   --   --   --   --   --   --   --   --   --   --   ALT  --  25  --   --   --   --   --   --   --   --   --   --   --   --    < > =  values in this interval not displayed.   Liver Function Tests: Recent Labs  Lab 10/27/20 0527 10/31/20 0530  AST 56*  --   ALT 25  --   ALKPHOS 62  --   BILITOT 1.0  --   PROT 8.5*  --   ALBUMIN 4.5 2.8*   No results for input(s): LIPASE, AMYLASE in the last 168 hours. Recent Labs  Lab 10/27/20 1142 10/31/20 0949  AMMONIA 51* 37*   CBC: Recent Labs  Lab 10/27/20 1655 10/28/20 0706 10/29/20 0346 10/30/20 0159 10/30/20 2104 10/31/20 0530  WBC 17.6* 10.9* 11.3* 10.3  --  10.9*  HGB 13.7 12.4* 11.3* 9.6* 11.2* 10.3*  HCT 39.8 36.7* 31.8* 27.0* 33.0* 30.0*  MCV 90.2 89.7 88.3 87.1  --  88.5  PLT 406* 349 190 251  --  311   Cardiac Enzymes: Recent Labs  Lab 10/27/20 0302 10/28/20 0706 10/29/20 1047 10/30/20 0159  CKTOTAL 141 32,777* 41,656* 28,110*   CBG: Recent Labs  Lab 10/31/20 1650 10/31/20 1938 11/01/20 0059 11/01/20 0329 11/01/20 0728  GLUCAP 96 113* 111* 104* 121*    Iron Studies: No results for input(s): IRON, TIBC, TRANSFERRIN, FERRITIN in the last 72 hours. Studies/Results: CT HEAD WO CONTRAST  Result Date: 10/30/2020 CLINICAL DATA:  Altered mental status. EXAM: CT HEAD WITHOUT CONTRAST TECHNIQUE: Contiguous axial images were obtained from the base of the skull through the vertex without intravenous contrast. COMPARISON:  October 27, 2020 FINDINGS: Brain: No evidence of acute infarction, hemorrhage, hydrocephalus, extra-axial collection or mass lesion/mass effect. Vascular: No hyperdense vessel or unexpected calcification. Skull: Normal. Negative for fracture or focal lesion. Sinuses/Orbits: Very mild proximal right ethmoid sinus mucosal thickening is noted. Other: None. IMPRESSION: No acute intracranial pathology. Electronically Signed    By: Virgina Norfolk M.D.   On: 10/30/2020 19:16   MR BRAIN WO CONTRAST  Result Date: 10/31/2020 CLINICAL DATA:  Encephalopathy.  Mental status changes. EXAM: MRI HEAD WITHOUT CONTRAST TECHNIQUE: Multiplanar, multiecho pulse sequences of the brain and surrounding structures were obtained without intravenous contrast. COMPARISON:  Head CT yesterday. FINDINGS: Brain: The brain has a normal appearance without evidence of malformation, atrophy, old or acute small or large vessel infarction, mass lesion, hemorrhage, hydrocephalus or extra-axial collection. Vascular: Major vessels at the base of the brain show flow. Venous sinuses appear patent. Skull and upper cervical spine: Normal. Sinuses/Orbits: Mucosal thickening of the paranasal sinuses. Small mastoid effusions. Both findings often seen with endotracheal intubation. Orbits negative. Other: None significant. IMPRESSION: 1. Normal appearance of the brain itself. 2. Mucosal thickening of the paranasal sinuses. Small mastoid effusions. Both findings often seen with endotracheal intubation. Electronically Signed   By: Nelson Chimes M.D.   On: 10/31/2020 14:39   Portable Chest x-ray  Result Date: 10/30/2020 CLINICAL DATA:  Endotracheal tube present. EXAM: PORTABLE CHEST 1 VIEW COMPARISON:  Chest radiograph 10/27/2020 FINDINGS: Endotracheal tube tip 3.1 cm from the carina. Tip and side port of the enteric tube below the diaphragm in the stomach. Stable heart size and mediastinal contours. Question of small bilateral pleural effusions. Developing ill-defined bilateral perihilar opacities. No pneumothorax. IMPRESSION: 1. Endotracheal tube tip 3.1 cm from the carina. Enteric tube in place. 2. Developing ill-defined bilateral perihilar opacities may represent atelectasis, pulmonary edema, or airspace disease/pneumonia. Electronically Signed   By: Keith Rake M.D.   On: 10/30/2020 20:30   . amLODipine  10 mg Per Tube Daily  . chlorhexidine gluconate (MEDLINE  KIT)  15 mL Mouth Rinse BID  .  Chlorhexidine Gluconate Cloth  6 each Topical Daily  . docusate  100 mg Per Tube BID  . heparin injection (subcutaneous)  5,000 Units Subcutaneous Q8H  . mouth rinse  15 mL Mouth Rinse 10 times per day  . multivitamin with minerals  1 tablet Per Tube Daily  . pantoprazole (PROTONIX) IV  40 mg Intravenous Q12H  . polyethylene glycol  17 g Per Tube Daily  . vitamin B-12  1,000 mcg Oral Daily    BMET    Component Value Date/Time   NA 132 (L) 11/01/2020 0803   K 4.0 11/01/2020 0803   CL 94 (L) 11/01/2020 0803   CO2 17 (L) 11/01/2020 0803   GLUCOSE 108 (H) 11/01/2020 0803   BUN 81 (H) 11/01/2020 0803   CREATININE 11.94 (H) 11/01/2020 0803   CALCIUM 6.7 (L) 11/01/2020 0803   GFRNONAA 5 (L) 11/01/2020 0803   CBC    Component Value Date/Time   WBC 10.9 (H) 10/31/2020 0530   RBC 3.39 (L) 10/31/2020 0530   HGB 10.3 (L) 10/31/2020 0530   HCT 30.0 (L) 10/31/2020 0530   PLT 311 10/31/2020 0530   MCV 88.5 10/31/2020 0530   MCH 30.4 10/31/2020 0530   MCHC 34.3 10/31/2020 0530   RDW 12.3 10/31/2020 0530    Assessment/Plan: 1.  Severe AKI secondary to rhabdomyolysis: Creatinine plateaued and urine output improving 1. renal US without obstruction.   2. GN serologies negative thus far 3. CK levels starting to decline and UOP improving. 4. Continue IV fluids with normal saline 5. Continue to hold off on dialysis for now given slow signs of improvement.  Altered mental status likely multifactorial and unlikely uremia 2. Acute hyperactive delirium -likely multifactorial.  Toxic metabolic encephalopathy possibly contributing to some extent.  Neurology following an LP and MRI performed 3. Acute rhabdomyolysis -continue IV fluids.  CK downtrending 4. Hypokalemia -resolved.  Potassium normal today 5. Hyponatremia - due to #1. Continue to follow.  6. Hypocalcemia: Likely secondary to rhabdomyolysis.  Continue repletion IV.  Improved 7. Hyperphosphatemia:  Secondary to rhabdomyolysis.  Continue to monitor.  No need for medications at this time 8. Aspiration pneumonitis/sphenoid sinusitis - treated with ceftriaxone then changed to ancef/vanc due to concerns for septic shoulder.  MRI negative now on ceftriaxone 9. Hypertension: likely will improve with improving UOP. CTM

## 2020-11-01 NOTE — Progress Notes (Addendum)
Neurology Progress Note Reason for Consult: New onset hypoactive delirum Requesting Physician: Cyril Mourning, MD  Subjective: Andrew [name] is a 40 y.o. male that presented to the ER with two weeks of chest pain and dizziness that started day of presentation with PMH of MDD and PTSD. On exam today patient remains intubated, but is alert and attempts to communicate intermittently giving thumbs up and shaking his head; however at other moments in the exam patient appears to not participate in answering some questions. Patient was able to clarify that he is no longer having muscle pain and continues to report not having chest pain. Patient was not willing to answer if he had taken more than his prescribed amount of Xanax at one time, but did answer yes that he had been taking his Xanax.   Exam: Vitals:   11/01/20 0842 11/01/20 0900  BP:  (!) 170/100  Pulse:  81  Resp:  (!) 26  Temp:    SpO2: 97% 100%   Gen: In bed, NAD Resp: non-labored breathing, no acute distress Abd: distended, nt Extremities: Edematous, but improving Eyes: No scleral injections HENT: Remains intubated CV: RRR Skin: Warm and dry   Neuro: Patient is awake, alert,  oriented to hospital by choices (ET tube). However, patient appears to be able to follow simple commands and aware of conversation.  Patient is unable to give a clear and coherent history due to intubation. No signs of neglect II: Visual Fields are full. Pupils are equal, round, and reactive to light.   III,IV, VI: EOMI without ptosis or diploplia.  V: Symmetric reaction to eyelash brush, patient not able to clarify if he has equal feeling in V1- V3 bilaterally VII: Facial movement is symmetric in the upper face, lower face limited secondary to ET tube.  VIII: hearing is intact to voice XI/X: Gag and cough reflex is intact  Reflexes: Appear muted in brachioradialis, wrist, patellar, and Achilles 2/2 edema in extremities Notably, while attending was  able to elicit reflexes yesterday, today they are absent  Motor: Tone is normal. Bulk is difficult to assess due to to edema but likely normal. 3/5 strength was present in bilateral UE. 1/5 strength in RLE w/ attempts to move noted at the hip and 2/5 in LLE as patient was able to lift his knee of the bed.   On later attending examination, 4/5 throughout the upper extremities, but 2-3/5 throughout the lower extremities  Sensory: Sensation is symmetric to light touch and temperature in the arms and legs.  Impression:  Andrew [name] is a 40 yo patient with a hx of MDD and PTSD. Patient continues to make improvements with NA WNL for the first time and increased urine production . At this time patient extremities remain edematous which make it difficult to assess patient's reflexes and may also be restricting patient's movements however, patient does appear to have some increased strength today in his LLE and bilateral grip. Patient remains intubated at this time, will await for extubation at the direction of Critcal Care primary team, pending improvement in his metabolic parameters such as BUN  We do note that his reflexes are more difficult to elicit today and therefore we will continue to follow the patient clinically.   Recommendations: - F/ U VDRL, CSF - Continue B12 supplementation - F/U MMA - Consider visitor restriction - Neurology will continue to follow  [name], MD PGY-1  Attending Neurologist's note:  I personally saw this patient, gathering history, performing a full  neurologic examination, reviewing relevant labs, and formulated the assessment and plan, adding the note above for completeness and clarity to accurately reflect my thoughts  CRITICAL CARE Performed by: Gordy Councilman   Total critical care time: 35 minutes  Critical care time was exclusive of separately billable procedures and treating other patients, and exclusive of supervision/training of resident  physicians and advanced practice providers.  Critical care was necessary to treat or prevent imminent or life-threatening deterioration.  Critical care was time spent personally by me on the following activities: development of treatment plan with patient and/or surrogate as well as nursing, discussions with consultants, evaluation of patient's response to treatment, examination of patient, obtaining history from patient or surrogate, ordering and performing treatments and interventions, ordering and review of laboratory studies, ordering and review of radiographic studies, pulse oximetry and re-evaluation of patient's condition.

## 2020-11-01 NOTE — Progress Notes (Signed)
Physical Therapy Re-evaluation Patient Details Name: Andrew [name] MRN: [id number] DOB: 26-May-1981 Today's Date: 11/01/2020    History of Present Illness 39y/o male admitted 3/2 with two weeks of chest pain.  While in the ER pt refusing care and became very combative with security involved. He then became very tachycardic with HR 180 and temp of 102.5.  Pt was emergently intubated 3/2- 3/4. EEG suggested moderate, diffuse encephalopathy without seizure activity. Found to be in rhabdo with AKI. RUE pain--MRI shoulder:Edema in musculature about the shoulder is worst in the  infraspinatus and teres minor and consistent with rhabdomyolysis or  infectious or inflammatory myositis. reintubated 3/6 due to hypoactive delirium for airway protection; 3/7 lumbar puncture results pending  PMhx: PTSD, anxiety    PT Comments    Patient was intubated since last PT session. Remains intubated with light sedation and able to follow commands for ROM exercises. Currently +2 total assist for bed mobility--partly because of extremely stiff bil LEs due to rhabdomyolysis. Goals updated however will need to continue to assess for appropriate discharge venue.     Follow Up Recommendations  Other (comment) (continue to assess as extubated)     Equipment Recommendations  None recommended by PT    Recommendations for Other Services       Precautions / Restrictions Precautions Precautions: Fall Precaution Comments: really painful RUE    Mobility  Bed Mobility Overal bed mobility: Needs Assistance   Rolling: Total assist              Transfers                    Ambulation/Gait                 Stairs             Wheelchair Mobility    Modified Rankin (Stroke Patients Only)       Balance                                            Cognition Arousal/Alertness: Lethargic;Suspect due to medications (light sedation 2/2 intubated) Behavior During  Therapy: Flat affect Overall Cognitive Status: Difficult to assess                         Following Commands: Follows one step commands inconsistently;Follows one step commands with increased time     Problem Solving: Slow processing;Decreased initiation;Requires verbal cues;Requires tactile cues        Exercises General Exercises - Upper Extremity Shoulder Flexion: AAROM;Both;5 reps;Supine General Exercises - Lower Extremity Ankle Circles/Pumps: PROM;Both (30 sec stretch x 2 bil; to neutral bil) Heel Slides: AAROM;Both;5 reps (hip flexion 45 Rt 30 Lt) Hip ABduction/ADduction: AAROM;Both;10 reps    General Comments General comments (skin integrity, edema, etc.): pt intubated on PS/CPAP with RR 18-26, sats 100%      Pertinent Vitals/Pain Pain Assessment: Faces Faces Pain Scale: Hurts even more Pain Location: LLE with PROM Pain Descriptors / Indicators: Grimacing;Guarding;Tightness Pain Intervention(s): Limited activity within patient's tolerance;Monitored during session;Repositioned    Home Living                      Prior Function            PT Goals (current goals can now be found in the care plan  section) Acute Rehab PT Goals Patient Stated Goal: Pt unable to state due to intubated PT Goal Formulation: Patient unable to participate in goal setting Time For Goal Achievement: 11/15/20 Potential to Achieve Goals: Fair Progress towards PT goals: Not progressing toward goals - comment;Goals downgraded-see care plan    Frequency    Min 3X/week      PT Plan Discharge plan needs to be updated    Co-evaluation              AM-PAC PT "6 Clicks" Mobility   Outcome Measure  Help needed turning from your back to your side while in a flat bed without using bedrails?: Total Help needed moving from lying on your back to sitting on the side of a flat bed without using bedrails?: Total Help needed moving to and from a bed to a chair (including a  wheelchair)?: Total Help needed standing up from a chair using your arms (e.g., wheelchair or bedside chair)?: Total Help needed to walk in hospital room?: Total Help needed climbing 3-5 steps with a railing? : Total 6 Click Score: 6    End of Session   Activity Tolerance: Patient tolerated treatment well (bed level activities) Patient left: with call bell/phone within reach;in bed;with bed alarm set Nurse Communication: Other (comment) (participated x 4 extremities) PT Visit Diagnosis: Other abnormalities of gait and mobility (R26.89);Muscle weakness (generalized) (M62.81);Pain Pain - Right/Left: Right Pain - part of body: Arm     Time: 8032-1224 PT Time Calculation (min) (ACUTE ONLY): 19 min  Charges:  $Therapeutic Exercise: 8-22 mins                      Jerolyn Center, PT Pager [phone]    Zena Amos 11/01/2020, 4:55 PM

## 2020-11-01 NOTE — Progress Notes (Signed)
NAME:  Andrew [name], MRN:  [id number], DOB:  12-03-1980, LOS: 5 ADMISSION DATE:  10/26/2020,   Brief History:  40 y/o male that presented to the ER with chest pain & acute hyperactive delirium requiring mechanical ventilation.  Subsequently found to be in rhabdo with AKI   Past Medical History:  PTSD Depression   Significant Hospital Events:  3/4 extubated, AKI worse, found to be in rhabdo 3/5 Continues to have diffuse muscle pain, renal consult 3/6 increased body pain particularly right shoulder, concern for septic arthritis, MRI neg 3/6 reintubated  Consults:  Renal Psych Neuro  Procedures:  ETT 3/3  >> 3/4         3/6 >>  Significant Diagnostic Tests:  Head CT >> sphenoid sinusitis Echo 3/3 >> nml LVEF, no WMA MRI right shoulder 3/6>> no evidence of septic joint, no effusion, severe edema and muscles around shoulder joint consistent with rhabdo  Head CT 3/6 >> neg Head MRI 3/7 >> neg  Micro Data:  COVID (-) Influenza (-) resp 3/3 >> GPC, GNR >>nml flora Blood 3/3 >> ng Blood 3/6 >> ng x1 day CSF gram stain >> no organisms  Antimicrobials:  ceftx 3/3 >> 3/6 azithro 3/3 >> Cefazolin 3/6 >> vanc 3/6  X 1 dose Ceftriaxone (meningitic dosing) 3/7>>    SUBJ  Improved mental status, following simple commands and opening eyes when spoken to. Successful SBT. UOP continues to improve Afebrile   Objective   Blood pressure (!) 180/98, pulse 68, temperature 98.6 F (37 C), temperature source Oral, resp. rate 18, height 5\' 10"  (1.778 m), weight 114.7 kg, SpO2 100 %.    Vent Mode: PRVC FiO2 (%):  [40 %] 40 % Set Rate:  [20 bmp] 20 bmp Vt Set:  [580 mL] 580 mL PEEP:  [5 cmH20] 5 cmH20 Plateau Pressure:  [27 cmH20-33 cmH20] 29 cmH20   Intake/Output Summary (Last 24 hours) at 11/01/2020 0816 Last data filed at 11/01/2020 0700 Gross per 24 hour  Intake 6072.72 ml  Output 1450 ml  Net 4622.72 ml   Filed Weights   10/29/20 0500 10/31/20 0500 11/01/20 0500   Weight: 105.6 kg 107.9 kg 114.7 kg    Examination: General: NAD, not-chronically ill appearing, intubated, sedated on propofol and fentanyl HENT: No pallor, no icterus, MMM Lungs: Bilateral ventilated breath sounds, no accessory muscle use, wheezing right upper lung field Cardiovascular: RRR, normal S1S2, no MRG Abdomen: soft, mildly distended Extremities: No edema/cyanosis/ clubbing, tense skin over left shoulder Neuro: Winces to stimulation, follows simple commands (squeeze hand, move extremities), RASS -1   Resolved problems   Assessment & Plan:   Presented with agitated delirium and rhabdo, suspect Wellbutrin and Seroquel as culprits but surreptitious drug use being in the differential although UDS negative x2 -Initially improved and extubated but then intubated for airway protection due to worsening somnolence on 3/6  Acute Respiratory Failure -reintubated 3/6 -Vent settings reviewed -Continue spontaneous breathing trials -monitor for improvement in acidosis and mental status prior to d/c vent  Acute encephalopathy/hyperactive Delirium  -neuro consulted, most likely toxic metabolic insult rather than nonspecific viral syndrome - concern for intoxication/withdrawal given history of pain medication use and acute decompensation after girlfriend visit 3/5 -minor improvement today in level of alertness and interaction on exam -possibly some component of uremic encephalopathy given acute renal failure, BUN 81 -no acute etiology found on MRI, CT, EEG, or infectious work-up -hold Wellbutrin and Seroquel -wean precedex and fentanyl as able to -continue neuro checks,  q4h delirium screening  AKI , rhabdomyolysis AG Metabolic Acidosis 2/2 Acute Renal Failure -Continue to hydrate aggressively with  fluids 258mL/hr -Fentanyl for severe pain (prefers fentanyl to Dilaudid) -UOP improving -strict I&O, daily BMET, mag, and phos -f/u CK, lactic acid  Hypertension -no PMHx of HTN on  chart review, no home antihypertensive medications -start Amlodipine 10 mg -hydralazine for SBP>170  Aspiration pneumonitis/sphenoid sinusitis -afebrile since 3/6 -leukocytosis and fever at admission likely 2/2 to rhabdo -procalcitonin elevated, however in the setting of rhabdo and no clear infectious source identified on CXR, Bcx, or CSF gram stain, it is unclear the significance of this finding  Bloody/coffee-ground NG aspirate -small amount of emesis suctioned yesterday -Hgb stable at 10.3 most recently, likely hemodilution from aggressive IVF -continue Protonix twice daily   Best practice (evaluated daily)  Diet: NPO Pain/Anxiety/Delirium protocol (if indicated):Fent gtt + precedex VAP protocol (if indicated): yes DVT prophylaxis: lovenox GI prophylaxis: protonix Glucose control: Monitor blood sugar Mobility: bedrest Disposition:ICU  Goals of Care:   Code Status: Full  Labs   CBC: Recent Labs  Lab 10/27/20 1655 10/28/20 0706 10/29/20 0346 10/30/20 0159 10/30/20 2104 10/31/20 0530  WBC 17.6* 10.9* 11.3* 10.3  --  10.9*  HGB 13.7 12.4* 11.3* 9.6* 11.2* 10.3*  HCT 39.8 36.7* 31.8* 27.0* 33.0* 30.0*  MCV 90.2 89.7 88.3 87.1  --  88.5  PLT 406* 349 190 251  --  311    Basic Metabolic Panel: Recent Labs  Lab 10/28/20 0706 10/28/20 1831 10/29/20 1047 10/30/20 0159 10/30/20 2104 10/31/20 0530 11/01/20 0152  NA 134* 131* 131* 129* 129* 130*  --   K 3.2* 3.0* 3.0* 3.2* 3.6 3.3*  --   CL 101 96* 90* 83*  --  86*  --   CO2 17* 18* 23 27  --  22  --   GLUCOSE 105* 155* 124* 110*  --  86  --   BUN 52* 56* 61* 64*  --  72*  --   CREATININE 5.53* 7.09* 8.88* 10.16*  --  11.69*  --   CALCIUM 8.0* 7.6* 7.0* 6.5*  --  6.2*  --   MG 3.6*  --  2.6* 2.4  --  2.2 2.2  PHOS 5.3*  --  6.0* 7.3*  --  7.0* 7.0*   GFR: Estimated Creatinine Clearance: 10.4 mL/min (A) (by C-G formula based on SCr of 11.69 mg/dL (H)). Recent Labs  Lab 10/27/20 0445 10/27/20 0701  10/27/20 0706 10/27/20 1142 10/27/20 1655 10/28/20 0706 10/29/20 0346 10/30/20 0159 10/31/20 0530 10/31/20 0949 11/01/20 0152  PROCALCITON  --   --   --   --   --   --   --   --   --  3.94 3.92  WBC  --   --    < >  --    < > 10.9* 11.3* 10.3 10.9*  --   --   LATICACIDVEN >11.0* 2.1*  --  2.0*  --   --   --   --   --   --   --    < > = values in this interval not displayed.    Liver Function Tests: Recent Labs  Lab 10/27/20 0527 10/31/20 0530  AST 56*  --   ALT 25  --   ALKPHOS 62  --   BILITOT 1.0  --   PROT 8.5*  --   ALBUMIN 4.5 2.8*   No results for input(s): LIPASE, AMYLASE in the last  168 hours. Recent Labs  Lab 10/27/20 1142 10/31/20 0949  AMMONIA 51* 37*    ABG    Component Value Date/Time   PHART 7.387 10/30/2020 2104   PCO2ART 58.0 (H) 10/30/2020 2104   PO2ART 529 (H) 10/30/2020 2104   HCO3 34.6 (H) 10/30/2020 2104   TCO2 36 (H) 10/30/2020 2104   O2SAT 100.0 10/30/2020 2104     Coagulation Profile: Recent Labs  Lab 10/27/20 0527  INR 1.1    Cardiac Enzymes: Recent Labs  Lab 10/27/20 0302 10/28/20 0706 10/29/20 1047 10/30/20 0159  CKTOTAL 141 32,777* 41,656* 28,110*    HbA1C: No results found for: HGBA1C  CBG: Recent Labs  Lab 10/31/20 1650 10/31/20 1938 11/01/20 0059 11/01/20 0329 11/01/20 0728  GLUCAP 96 113* 111* 104* 121*    Jori Moll, Medical Student 11/01/2020, 8:15 AM

## 2020-11-02 ENCOUNTER — Inpatient Hospital Stay (HOSPITAL_COMMUNITY): Payer: Medicaid Other

## 2020-11-02 DIAGNOSIS — N179 Acute kidney failure, unspecified: Secondary | ICD-10-CM | POA: Diagnosis present

## 2020-11-02 DIAGNOSIS — Z452 Encounter for adjustment and management of vascular access device: Secondary | ICD-10-CM | POA: Insufficient documentation

## 2020-11-02 DIAGNOSIS — G629 Polyneuropathy, unspecified: Secondary | ICD-10-CM

## 2020-11-02 LAB — CBC
HCT: 29.1 % — ABNORMAL LOW (ref 39.0–52.0)
Hemoglobin: 10 g/dL — ABNORMAL LOW (ref 13.0–17.0)
MCH: 31.2 pg (ref 26.0–34.0)
MCHC: 34.4 g/dL (ref 30.0–36.0)
MCV: 90.7 fL (ref 80.0–100.0)
Platelets: 341 10*3/uL (ref 150–400)
RBC: 3.21 MIL/uL — ABNORMAL LOW (ref 4.22–5.81)
RDW: 13.3 % (ref 11.5–15.5)
WBC: 9.5 10*3/uL (ref 4.0–10.5)
nRBC: 0 % (ref 0.0–0.2)

## 2020-11-02 LAB — BLOOD GAS, ARTERIAL
Acid-base deficit: 3.1 mmol/L — ABNORMAL HIGH (ref 0.0–2.0)
Acid-base deficit: 4.8 mmol/L — ABNORMAL HIGH (ref 0.0–2.0)
Bicarbonate: 21.5 mmol/L (ref 20.0–28.0)
Bicarbonate: 19 mmol/L — ABNORMAL LOW (ref 20.0–28.0)
FIO2: 40
FIO2: 40
O2 Saturation: 47.7 %
O2 Saturation: 99.1 %
Patient temperature: 36.6
Patient temperature: 36.6
pCO2 arterial: 38.9 mmHg (ref 32.0–48.0)
pCO2 arterial: 30.1 mmHg — ABNORMAL LOW (ref 32.0–48.0)
pH, Arterial: 7.359 (ref 7.350–7.450)
pH, Arterial: 7.414 (ref 7.350–7.450)
pO2, Arterial: <31 mmHg — CL (ref 83.0–108.0)
pO2, Arterial: 157 mmHg — ABNORMAL HIGH (ref 83.0–108.0)

## 2020-11-02 LAB — PROCALCITONIN: Procalcitonin: 3.65 ng/mL

## 2020-11-02 LAB — GLUCOSE, CAPILLARY
Glucose-Capillary: 111 mg/dL — ABNORMAL HIGH (ref 70–99)
Glucose-Capillary: 121 mg/dL — ABNORMAL HIGH (ref 70–99)
Glucose-Capillary: 124 mg/dL — ABNORMAL HIGH (ref 70–99)
Glucose-Capillary: 130 mg/dL — ABNORMAL HIGH (ref 70–99)
Glucose-Capillary: 135 mg/dL — ABNORMAL HIGH (ref 70–99)
Glucose-Capillary: 144 mg/dL — ABNORMAL HIGH (ref 70–99)

## 2020-11-02 LAB — BASIC METABOLIC PANEL
Anion gap: 17 — ABNORMAL HIGH (ref 5–15)
BUN: 90 mg/dL — ABNORMAL HIGH (ref 6–20)
CO2: 17 mmol/L — ABNORMAL LOW (ref 22–32)
Calcium: 7 mg/dL — ABNORMAL LOW (ref 8.9–10.3)
Chloride: 101 mmol/L (ref 98–111)
Creatinine, Ser: 11.14 mg/dL — ABNORMAL HIGH (ref 0.61–1.24)
GFR, Estimated: 5 mL/min — ABNORMAL LOW (ref >60)
Glucose, Bld: 132 mg/dL — ABNORMAL HIGH (ref 70–99)
Potassium: 4.2 mmol/L (ref 3.5–5.1)
Sodium: 135 mmol/L (ref 135–145)

## 2020-11-02 LAB — ANTINUCLEAR ANTIBODIES, IFA: ANA Ab, IFA: NEGATIVE

## 2020-11-02 LAB — CK: Total CK: 6322 U/L — ABNORMAL HIGH (ref 49–397)

## 2020-11-02 LAB — MAGNESIUM: Magnesium: 2.2 mg/dL (ref 1.7–2.4)

## 2020-11-02 LAB — LACTIC ACID, PLASMA: Lactic Acid, Venous: 1.3 mmol/L (ref 0.5–1.9)

## 2020-11-02 LAB — PHOSPHORUS: Phosphorus: 6.8 mg/dL — ABNORMAL HIGH (ref 2.5–4.6)

## 2020-11-02 MED ORDER — MIDAZOLAM HCL 2 MG/2ML IJ SOLN
INTRAMUSCULAR | Status: AC
  Administered 2020-11-02: 2 mg
  Filled 2020-11-02: qty 2

## 2020-11-02 MED ORDER — HEPARIN SODIUM (PORCINE) 1000 UNIT/ML IJ SOLN
2.4000 mL | Freq: Once | INTRAMUSCULAR | Status: AC
  Administered 2020-11-02: 2400 [IU]
  Filled 2020-11-02: qty 3

## 2020-11-02 MED ORDER — PANTOPRAZOLE SODIUM 40 MG PO PACK
40.0000 mg | PACK | Freq: Every day | ORAL | Status: DC
  Administered 2020-11-03 – 2020-11-07 (×5): 40 mg
  Filled 2020-11-02 (×6): qty 20

## 2020-11-02 MED ORDER — MIDAZOLAM HCL 2 MG/2ML IJ SOLN
INTRAMUSCULAR | Status: AC
  Filled 2020-11-02: qty 2

## 2020-11-02 MED ORDER — MIDAZOLAM HCL 2 MG/2ML IJ SOLN
2.0000 mg | Freq: Once | INTRAMUSCULAR | Status: AC
  Administered 2020-11-02: 2 mg via INTRAVENOUS

## 2020-11-02 MED ORDER — FENTANYL CITRATE (PF) 100 MCG/2ML IJ SOLN
100.0000 ug | Freq: Once | INTRAMUSCULAR | Status: AC
Start: 2020-11-02 — End: 2020-11-02
  Administered 2020-11-02: 100 ug via INTRAVENOUS

## 2020-11-02 MED ORDER — PROPOFOL 1000 MG/100ML IV EMUL
0.0000 ug/kg/min | INTRAVENOUS | Status: DC
  Administered 2020-11-02: 20 ug/kg/min via INTRAVENOUS
  Filled 2020-11-02: qty 100

## 2020-11-02 MED ORDER — METOPROLOL TARTRATE 25 MG/10 ML ORAL SUSPENSION
25.0000 mg | Freq: Two times a day (BID) | ORAL | Status: DC
  Administered 2020-11-02 (×2): 25 mg
  Filled 2020-11-02 (×2): qty 10

## 2020-11-02 MED ORDER — MIDAZOLAM HCL 2 MG/2ML IJ SOLN: 2.0000 mg | Freq: Once | INTRAMUSCULAR | Status: AC

## 2020-11-02 NOTE — Progress Notes (Addendum)
Neurology Progress Note Reason for Consult:New onset hypoactive delirum Requesting Physician:Rakesh Vassie Loll, MD  Subjective: Andrew J Crawfordis a 40 y.o.malethat presented to the ER with two weeks of chest painand dizziness that started day of presentation with PMH of MDD and PTSD. On exam today patient appears to exhibit symptoms of hypoactive delirium. The patient appeared frightened throughout the exam. Patient appeared to attempt to mouth things, but was unable to vocalize as he remains intubated. Patient was not able to communicate with shaking his head or giving thumbs up well today. At minimum patient was able to communicate that he knew he was not at home, but also did not think he was in the hospital. Patient was not combative but appeared somewhat tearful and did appear to have pain when raising his arms R>L.  Exam: Vitals:   11/02/20 0801 11/02/20 0814  BP:    Pulse:    Resp:    Temp:  99.2 F (37.3 C)  SpO2: 99%    Gen: In bed, NAD Resp: non-labored breathing, no acute distress Abd: soft, nt Extremities: Edematous. Patient does not actively move his limbs in beds, but is able to keep his arms up for about 3 seconds each side. Eyes: No scleral injections HENT: Remains intubated CV: RRR Skin: Warm and dry Psych: Patient appears anxious, confused and frightened. At times patient appears teary eyed on exam.    Neurological exam Patient is awake but drowsy and will open his eye to verbal stimuli and redirection. Patient is not oriented to place today.  Patient isunable to give a clear and coherent historydue to intubation. No signs of neglect II: Visual Fields are grossly full. Pupils are equal, round, and reactive to light.  III,IV, VI: EOMI grossly intact.  V:Equal response to eyelash brush bilaterally VII: Facial movement is symmetricin the upper face, lower face limited secondary to ET tube.  VIII: hearing is intact to voice XI/X:Cough reflex is intact, gag  not assessed today  Reflexes: Absent in bilateral patellas, Achilles Notably, while attending was able to elicit reflexes 3/7, today they are absent  Motor: Tone is normal. Bulk isdifficult to assess due toto edema but likely normal.3/5 strength was present inbilateral UE, obviously limited by pain. 0/5 BLE, notably unlike the past 2 days patient did not moved his lower extremities at the hips today.   Sensory: Appears grossly equally reactive to stimulation in all 4 extremities  Pertinent Labs: CMP Latest Ref Rng & Units 11/02/2020 11/01/2020 11/01/2020  Glucose 70 - 99 mg/dL 166(A) 630(Z) 601(U)  BUN 6 - 20 mg/dL 93(A) 35(T) 73(U)  Creatinine 0.61 - 1.24 mg/dL 20.25(K) 27.06(C) 37.62(G)  Sodium 135 - 145 mmol/L 135 135 132(L)  Potassium 3.5 - 5.1 mmol/L 4.2 4.0 4.0  Chloride 98 - 111 mmol/L 101 97(L) 94(L)  CO2 22 - 32 mmol/L 17(L) 20(L) 17(L)  Calcium 8.9 - 10.3 mg/dL 7.0(L) 6.9(L) 6.7(L)  Total Protein 6.5 - 8.1 g/dL - - -  Total Bilirubin 0.3 - 1.2 mg/dL - - -  Alkaline Phos 38 - 126 U/L - - -  AST 15 - 41 U/L - - -  ALT 0 - 44 U/L - - -   CBC Latest Ref Rng & Units 11/02/2020 10/31/2020 10/30/2020  WBC 4.0 - 10.5 K/uL 9.5 10.9(H) -  Hemoglobin 13.0 - 17.0 g/dL 10.0(L) 10.3(L) 11.2(L)  Hematocrit 39.0 - 52.0 % 29.1(L) 30.0(L) 33.0(L)  Platelets 150 - 400 K/uL 341 311 -   VDRL, CSF (-)  Impression:  Andrew [name] is a 40 yo patient with a hx of MDD and PTSD. Patient's clinical exam today shows decline in patient function. Patient appeared more delirious today with a frightened appearance. Unlike the previous exams patient was unable to move his lower extremities today causing concern. Patient also continued to have hyporeflexia with reflexes that are essentially mute. 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 Interestingly with patient's hyperphosphatemia the patient would be expected to be hyperreflexic. GBS is also on  the radar; however patient' poor kidney function contraindicates the use of IVIG. 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 Patient Cr has essentially plateued at 11. Uremic neuropathy has been shown to take months- years for recovery.  Patient has also had his psychiatric medication discontinued as there was concern that his Seroquel may have caused his rhabdo. Patient appeared to hallucinating on exam today and has a known hx of PTSD and MDD. It is unclear if patient had MDD w/ psychosis; however patient's home Seroquel dosage was appropropriate to treat a patient with possible psychosis hx. Decreased concern that patient delirium is 2.2 to infxn as patient WBC continues to trend downward.   Recommendations: - Continue B12 supplementation - F/U MMA - Delirium precautions: consistent reorientation, quiet sleeping atmosphere, minimizing excess noise and restraints - Appreciate plan to [facility] psychiatry once patient improves enough to be extubated - Appreciate nephrology consideration for hemodialysis - Appreciate excellent care of the patient's other comorbidities and ventilator by CCM team - Neurology will continue to follow  [name], MD PGY-1  Attending Neurologist's note:  I'm concerned about his worsening weakness and new arreflexia -- he had reflexes on my initial exam and has progressively been losing them. He is also weaker today than yesterday (could be an element of volition but yesterday he was stronger in the upper extremites and weaker in the lower extremities and that pattern is concerning for acute neuropathy as well; this pattern persists on examination today). Given his electrolyte derangements if anything reflexes should be increased (hyperphosphatemia would increase reflexes, he does not have hyperkalemia, hypocalcemia is improving).   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  I considered GBS as a potential etiology as well (potential for preceding viral syndrome triggering both rhabdo and GBS, elevated protein on lumbar puncture) but that would be more of a stretch and giving IVIG in the setting of his renal function is strongly contraindicated.  Appreciate nephrology consideration for dialysis today.  I personally saw this patient, gathering history, performing a full neurologic examination, reviewing relevant labs, and formulated the assessment and plan, adding the note above for completeness and clarity to accurately reflect my thoughts  CRITICAL CARE Performed by: Gordy Councilman   Total critical care time: 55 minutes  Critical care time was exclusive of separately billable procedures and treating other patients, and time spent supervising/teaching resident physicians or nurse practitioners.  Critical care was necessary to treat or prevent imminent or life-threatening deterioration.  Critical care was time spent personally by me on the following activities: development of treatment plan with patient and/or surrogate as well as nursing, discussions with consultants, evaluation of patient's response to treatment, examination of patient, obtaining history from patient or surrogate, ordering and performing treatments and interventions, ordering and review of laboratory studies, ordering and review of radiographic studies, pulse oximetry  and re-evaluation of patient's condition.

## 2020-11-02 NOTE — Progress Notes (Signed)
Occupational Therapy Treatment Patient Details Name: Andrew [name] MRN: [id number] DOB: 1980/09/24 Today's Date: 11/02/2020    History of present illness 40y/o male admitted 3/2 with two weeks of chest pain.  While in the ER pt refusing care and became very combative with security involved. He then became very tachycardic with HR 180 and temp of 102.5.  Pt was emergently intubated 3/2- 3/4. EEG suggested moderate, diffuse encephalopathy without seizure activity. Found to be in rhabdo with AKI. RUE pain--MRI shoulder:Edema in musculature about the shoulder is worst in the  infraspinatus and teres minor and consistent with rhabdomyolysis or  infectious or inflammatory myositis. reintubated 3/6 due to hypoactive delirium for airway protection; 3/7 lumbar puncture results pending  PMhx: PTSD, anxiety   OT comments  Pt set to chair position to 50* and pt tolerating well performing BUE HEP. VSS on vent; 30% FiO2, PEEP 5; 95 BPM, >92% O2.  Pt's with mitts on BUEs due to agitation (unsafe to advance beyond chair position. Pt following 1 step commands. Pt would benefit from continued OT skilled services for ADL, mobility and safety in CIR setting. OT following acutely.    Follow Up Recommendations  CIR;Supervision/Assistance - 24 hour    Equipment Recommendations  Other (comment) (to be determined)    Recommendations for Other Services Rehab consult    Precautions / Restrictions Precautions Precautions: Fall Precaution Comments: really painful RUE Restrictions Weight Bearing Restrictions: No       Mobility Bed Mobility Overal bed mobility: Needs Assistance             General bed mobility comments: chair position to 50* and pt tolerating well.    Transfers                 General transfer comment: DNT    Balance                                           ADL either performed or assessed with clinical judgement   ADL Overall ADL's : Needs  assistance/impaired                                     Functional mobility during ADLs: Moderate assistance General ADL Comments: Pt wearing mitts; intubated so all nutrition is coming through cortrak.     Vision   Vision Assessment?: No apparent visual deficits   Perception     Praxis      Cognition Arousal/Alertness: Awake/alert Behavior During Therapy: Flat affect Overall Cognitive Status: Difficult to assess Area of Impairment: Orientation;Attention;Following commands;Safety/judgement;Awareness;Problem solving                 Orientation Level: Disoriented to;Place;Situation Current Attention Level: Focused   Following Commands: Follows one step commands with increased time;Follows multi-step commands inconsistently Safety/Judgement: Decreased awareness of safety;Decreased awareness of deficits Awareness: Intellectual Problem Solving: Slow processing;Decreased initiation;Requires verbal cues;Requires tactile cues General Comments: Pt's eyes open entire session, but would bulge out in alck of awareness of situation looking all around room and would require re-orientation.        Exercises Exercises: General Upper Extremity General Exercises - Upper Extremity Shoulder Flexion: AROM;Left;AAROM;Right;10 reps;Supine Shoulder ADduction: AROM;Left;AAROM;Right;10 reps;Supine Shoulder Horizontal ABduction: AROM;Left;AAROM;Right;10 reps;Supine Elbow Flexion: AROM;Both;10 reps;Supine Elbow Extension: AROM;Both;10 reps;Supine Digit Composite Flexion: AROM;5 reps;Both Composite Extension:  AROM;Both;5 reps   Shoulder Instructions       General Comments VSS on vent; 30% FiO2, PEEP 5; 95 BPM, >92% O2    Pertinent Vitals/ Pain       Pain Assessment: Faces Faces Pain Scale: Hurts even more Pain Location: BUEs Pain Descriptors / Indicators: Grimacing;Guarding;Tightness Pain Intervention(s): Monitored during session;Premedicated before  session;Repositioned  Home Living                                          Prior Functioning/Environment              Frequency  Min 2X/week        Progress Toward Goals  OT Goals(current goals can now be found in the care plan section)  Progress towards OT goals: Progressing toward goals  Acute Rehab OT Goals Patient Stated Goal: Pt unable to state due to intubated OT Goal Formulation: Patient unable to participate in goal setting Time For Goal Achievement: 11/13/20 Potential to Achieve Goals: Good ADL Goals Pt Will Perform Grooming: Independently;standing Pt Will Perform Upper Body Bathing: Independently;sitting Pt Will Perform Lower Body Bathing: Independently;sit to/from stand Pt Will Perform Upper Body Dressing: Independently;sitting Pt Will Perform Lower Body Dressing: Independently;sit to/from stand Pt Will Transfer to Toilet: Independently;ambulating;regular height toilet Pt Will Perform Toileting - Clothing Manipulation and hygiene: Independently;sit to/from stand Additional ADL Goal #1: Follow one step commands 100%of time Additional ADL Goal #2: Pt will maintain eyes open entire session Additional ADL Goal #3: Pt will be Independent in and OOB for basic ADLs  Plan Discharge plan remains appropriate    Co-evaluation                 AM-PAC OT "6 Clicks" Daily Activity     Outcome Measure   Help from another person eating meals?: Total Help from another person taking care of personal grooming?: Total Help from another person toileting, which includes using toliet, bedpan, or urinal?: Total Help from another person bathing (including washing, rinsing, drying)?: Total Help from another person to put on and taking off regular upper body clothing?: Total Help from another person to put on and taking off regular lower body clothing?: Total 6 Click Score: 6    End of Session Equipment Utilized During Treatment: Oxygen  OT Visit  Diagnosis: Unsteadiness on feet (R26.81);Other abnormalities of gait and mobility (R26.89);Muscle weakness (generalized) (M62.81);Pain;Other symptoms and signs involving cognitive function Pain - Right/Left: Right Pain - part of body: Arm   Activity Tolerance Treatment limited secondary to medical complications (Comment) (intubation, requiring frequent redirection, unsafe without +2 assist)   Patient Left Other (comment);in bed;with call bell/phone within reach;with bed alarm set (chair position)   Nurse Communication Mobility status        Time: 5003-7048 OT Time Calculation (min): 21 min  Charges: OT General Charges $OT Visit: 1 Visit OT Treatments $Therapeutic Exercise: 8-22 mins  Flora Lipps, OTR/L Acute Rehabilitation Services Pager: [phone] Office: [phone]    Shayne Deerman c 11/02/2020, 6:01 PM

## 2020-11-02 NOTE — Progress Notes (Signed)
NAME:  Andrew [name], MRN:  [id number], DOB:  [birth year], LOS: 6 ADMISSION DATE:  10/26/2020,   Brief History:  40 y/o male that presented to the ER with chest pain & acute hyperactive delirium requiring mechanical ventilation.  Subsequently found to be in rhabdo with AKI   Past Medical History:  PTSD Depression   Significant Hospital Events:  3/4 extubated, AKI worse, found to be in rhabdo 3/5 Continues to have diffuse muscle pain, renal consult 3/6 increased body pain particularly right shoulder, concern for septic arthritis, MRI neg  Consults:  Renal Psych Neuro  Procedures:  ETT 3/3  >> 3/4         3/6 >>  Significant Diagnostic Tests:  Head CT >> sphenoid sinusitis Echo 3/3 >> nml LVEF, no WMA MRI right shoulder 3/6>> no evidence of septic joint, no effusion, severe edema and muscles around shoulder joint consistent with rhabdo  Head CT 3/6 >> neg CSF cell count 3/7 >> WBC 2, RBC 2 (hypocellular) MRI brain 3/7 >> normal, paranasal sinus mucosal thickening, small mastoid effusions  Micro Data:  COVID (-) Influenza (-) resp 3/3 >> GPC, GNR >>nml flora Blood 3/3 >> negative Blood 3/6 >> CSF 3/7 >>   Antimicrobials:  ceftx 3/3 >> 3/6 azithro 3/3 x 1 Cefazolin 3/6 >> 3/7 Ceftriaxone 3/7 >>  vanc 3/6  X 1 dose vanco 3/7 >> 3/8   SUBJECTIVE/interval events Tolerating PSV this morning CK and lactic acid both reassuring 3/9 Urine output 1745, I/O+ 23 L total.  Serum creatinine is peaked but still significant uremia   Objective   Blood pressure (!) 164/107, pulse 76, temperature 99.2 F (37.3 C), temperature source Oral, resp. rate 20, height 5\' 10"  (1.778 m), weight 112.5 kg, SpO2 99 %.    Vent Mode: PSV;CPAP FiO2 (%):  [30 %-40 %] 30 % Set Rate:  [20 bmp] 20 bmp Vt Set:  [580 mL] 580 mL PEEP:  [5 cmH20] 5 cmH20 Pressure Support:  [10 cmH20-12 cmH20] 10 cmH20   Intake/Output Summary (Last 24 hours) at 11/02/2020 0849 Last data filed at 11/02/2020  0800 Gross per 24 hour  Intake 6437.58 ml  Output 1745 ml  Net 4692.58 ml   Filed Weights   10/31/20 0500 11/01/20 0500 11/02/20 0438  Weight: 107.9 kg 114.7 kg 112.5 kg    Examination: General: Obese man, somewhat uncomfortable, attempting to communicate HENT: ET tube in place, OG tube in place, pupils equal, oropharynx moist. Lungs: Distant, decreased at both bases, no crackles or wheezes Cardiovascular: Distant, regular, tachycardic, no murmur, trace pretibial edema bilaterally Abdomen: Somewhat distended, tense, tympanitic, nontender with hypoactive bowel sounds Extremities: No rash, edema as above Neuro: Eyes open, nodding to questions, attempted to cough on command, initially would not move bilateral upper extremities, bilateral lower extremities on command.  Subsequently was able to do so but very weak.  Decreased reflexes per neuro exam 3/8   Resolved problems   Assessment & Plan:   Presented with agitated delirium and rhabdo, suspecting Wellbutrin and Seroquel as culprits but surreptitious drug use being in the differential although UDS negative x2.  No evidence occult seizure activity -Initially improved and extubated but then intubated for airway protection due to worsening somnolence on 3/6  Acute Resp Failure -reintubated 3/6 -Tolerating PSV 3/9.  Barriers for extubation include sedation needs, strength and airway protection.  Improved respiratory status compared with 3/8.  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  Discussed with neurology 3/9.  If his evolving neuropathy is due to his uremia then probable benefit in starting HD to minimize injury, duration of injury.  Will discuss this with nephrology 3/9 -Continue to hold Wellbutrin, Seroquel -Sedating with Precedex, fentanyl infusions, Xanax available -No evidence for bacterial pneumonia on CSF evaluation, could consider aseptic meningitis.  Follow CSF studies to completion.  No clear indication for acyclovir -B12 supplementation  Aspiration pneumonitis/sphenoid sinusitis -Plan to complete ceftriaxone 3/9, 7 days total antibiotics -Follow culture data, clinical status  Hypertension, not on home agents -Continue hydralazine available as needed -Continue amlodipine 10 mg once daily -Add metoprolol 25 mg twice daily on 3/9  AKI , rhabdomyolysis Acute metabolic acidosis Cause unclear.  Question meds.  No overt seizure activity noted.  Question shoulder injury based on his complaints of pain -Repeat CK 3/9 shows continued improvement, clearance -23 L positive, will stop his aggressive volume resuscitation 3/9 and follow -Bicarbonate infusion completed -Appreciate nephrology assistance.  He may be headed for renal recovery but his progressive neuropathy is an issue.  He may need hemodialysis in order to prevent neurological decline.  Will discuss with nephrology today.  Bloody/coffee-ground NG aspirate -PPI twice daily -Following CBC -Tolerating prophylactic subcutaneous heparin   Best practice (evaluated daily)  Diet: NPO Pain/Anxiety/Delirium protocol (if indicated):Fent gtt + precedex VAP protocol (if indicated): yes DVT prophylaxis: lovenox GI prophylaxis: protonix Glucose control: Monitor blood sugar Mobility: bedrest Disposition:ICU Family:  reviewed status with the patient's father by phone 3/8; no answer on 3/9, will try again. .   Goals of Care:   Code Status: Full  Labs   CBC: Recent Labs  Lab 10/28/20 0706 10/29/20 0346 10/30/20 0159 10/30/20 2104 10/31/20 0530 11/02/20 0129  WBC 10.9* 11.3* 10.3  --  10.9* 9.5  HGB 12.4* 11.3* 9.6* 11.2* 10.3* 10.0*  HCT 36.7* 31.8* 27.0* 33.0* 30.0* 29.1*  MCV 89.7 88.3 87.1  --  88.5 90.7  PLT 349 190 251  --  311 341    Basic Metabolic Panel: Recent Labs  Lab 10/29/20 1047 10/30/20 0159 10/30/20 2104 10/31/20 0530 11/01/20 0152 11/01/20 0803 11/01/20 1106 11/02/20 0129  NA 131* 129* 129* 130*  --  132* 135 135  K 3.0* 3.2* 3.6 3.3*  --  4.0 4.0 4.2  CL 90* 83*  --  86*  --  94* 97* 101  CO2 23 27  --  22  --  17* 20* 17*  GLUCOSE 124* 110*  --  86  --  108* 117* 132*  BUN 61* 64*  --  72*  --  81* 83* 90*  CREATININE 8.88* 10.16*  --  11.69*  --  11.94* 11.51* 11.14*  CALCIUM 7.0* 6.5*  --  6.2*  --  6.7* 6.9* 7.0*  MG 2.6* 2.4  --  2.2 2.2  --   --  2.2  PHOS 6.0* 7.3*  --  7.0* 7.0*  --   --  6.8*   GFR: Estimated Creatinine  Clearance: 10.8 mL/min (A) (by C-G formula based on SCr of 11.14 mg/dL (H)). Recent Labs  Lab 10/27/20 0445 10/27/20 0701 10/27/20 0706 10/27/20 1142 10/27/20 1655 10/29/20 0346 10/30/20 0159 10/31/20 0530 10/31/20 0949 11/01/20 0152 11/02/20 0129  PROCALCITON  --   --   --   --   --   --   --   --  3.94 3.92  --   WBC  --   --    < >  --    < > 11.3* 10.3 10.9*  --   --  9.5  LATICACIDVEN >11.0* 2.1*  --  2.0*  --   --   --   --   --   --  1.3   < > = values in this interval not displayed.    Liver Function Tests: Recent Labs  Lab 10/27/20 0527 10/31/20 0530  AST 56*  --   ALT 25  --   ALKPHOS 62  --   BILITOT 1.0  --   PROT 8.5*  --   ALBUMIN 4.5 2.8*   No results for input(s): LIPASE, AMYLASE in the last 168 hours. Recent Labs  Lab 10/27/20 1142 10/31/20 0949  AMMONIA 51* 37*    ABG    Component  Value Date/Time   PHART 7.414 11/02/2020 0323   PCO2ART 30.1 (L) 11/02/2020 0323   PO2ART 157 (H) 11/02/2020 0323   HCO3 19.0 (L) 11/02/2020 0323   TCO2 36 (H) 10/30/2020 2104   ACIDBASEDEF 4.8 (H) 11/02/2020 0323   O2SAT 99.1 11/02/2020 0323     Coagulation Profile: Recent Labs  Lab 10/27/20 0527  INR 1.1    Cardiac Enzymes: Recent Labs  Lab 10/27/20 0302 10/28/20 0706 10/29/20 1047 10/30/20 0159 11/02/20 0129  CKTOTAL 141 32,777* 41,656* 28,110* 6,322*    HbA1C: No results found for: HGBA1C  CBG: Recent Labs  Lab 11/01/20 1540 11/01/20 1947 11/01/20 2332 11/02/20 0342 11/02/20 0810  GLUCAP 118* 119* 121* 130* 135*       Critical care time: 33 min    Levy Pupa, MD, PhD 11/02/2020, 8:49 AM [facility] [phone] or if no answer before 7:00PM call [phone] For any issues after 7:00PM please call eLink [phone]

## 2020-11-02 NOTE — Progress Notes (Signed)
Patient ID: Andrew [name], male   DOB: [birth year], 40 y.o.   MRN: [id number] S: Mentation continues to be improved.  Neurology following noticed areflexia as well as overall weakness.  Some concern that uremia is contributing to that. O:BP (!) 146/93   Pulse 97   Temp 99.2 F (37.3 C) (Oral)   Resp (!) 31   Ht 5' 10"  (1.778 m)   Wt 112.5 kg   SpO2 97%   BMI 35.59 kg/m   Intake/Output Summary (Last 24 hours) at 11/02/2020 1116 Last data filed at 11/02/2020 1100 Gross per 24 hour  Intake 5304.08 ml  Output 1880 ml  Net 3424.08 ml   Intake/Output: I/O last 3 completed shifts: In: 9458.9 [I.V.:8697.4; NG/GT:550.5; IV Piggyback:210.9] Out: 2590 [Urine:2590]  Intake/Output this shift:  Total I/O In: 185 [I.V.:145; NG/GT:40] Out: 375 [Urine:375] Weight change: -2.2 kg Gen: Sedated, nods to questioning CVS: Normal rate Resp: Bilateral chest rise, ventilated, clear to auscultation Abd: Mild distention, +BS, NT Ext: 1+ pitting edema in the bilateral lower extremities, warm and well perfused  Recent Labs  Lab 10/27/20 0527 10/27/20 0629 10/27/20 1655 10/28/20 0706 10/28/20 1831 10/29/20 1047 10/30/20 0159 10/30/20 2104 10/31/20 0530 11/01/20 0152 11/01/20 0803 11/01/20 1106 11/02/20 0129  NA  --    < >  --  134* 131* 131* 129* 129* 130*  --  132* 135 135  K  --    < >  --  3.2* 3.0* 3.0* 3.2* 3.6 3.3*  --  4.0 4.0 4.2  CL  --   --   --  101 96* 90* 83*  --  86*  --  94* 97* 101  CO2  --   --   --  17* 18* 23 27  --  22  --  17* 20* 17*  GLUCOSE  --   --   --  105* 155* 124* 110*  --  86  --  108* 117* 132*  BUN  --   --   --  52* 56* 61* 64*  --  72*  --  81* 83* 90*  CREATININE  --    < >  --  5.53* 7.09* 8.88* 10.16*  --  11.69*  --  11.94* 11.51* 11.14*  ALBUMIN 4.5  --   --   --   --   --   --   --  2.8*  --   --   --   --   CALCIUM  --   --   --  8.0* 7.6* 7.0* 6.5*  --  6.2*  --  6.7* 6.9* 7.0*  PHOS  --    < > 4.1 5.3*  --  6.0* 7.3*  --  7.0* 7.0*  --   --  6.8*   AST 56*  --   --   --   --   --   --   --   --   --   --   --   --   ALT 25  --   --   --   --   --   --   --   --   --   --   --   --    < > = values in this interval not displayed.   Liver Function Tests: Recent Labs  Lab 10/27/20 0527 10/31/20 0530  AST 56*  --   ALT 25  --   ALKPHOS 62  --  BILITOT 1.0  --   PROT 8.5*  --   ALBUMIN 4.5 2.8*   No results for input(s): LIPASE, AMYLASE in the last 168 hours. Recent Labs  Lab 10/27/20 1142 10/31/20 0949  AMMONIA 51* 37*   CBC: Recent Labs  Lab 10/28/20 0706 10/29/20 0346 10/30/20 0159 10/30/20 2104 10/31/20 0530 11/02/20 0129  WBC 10.9* 11.3* 10.3  --  10.9* 9.5  HGB 12.4* 11.3* 9.6* 11.2* 10.3* 10.0*  HCT 36.7* 31.8* 27.0* 33.0* 30.0* 29.1*  MCV 89.7 88.3 87.1  --  88.5 90.7  PLT 349 190 251  --  311 341   Cardiac Enzymes: Recent Labs  Lab 10/27/20 0302 10/28/20 0706 10/29/20 1047 10/30/20 0159 11/02/20 0129  CKTOTAL 141 32,777* 41,656* 28,110* 6,322*   CBG: Recent Labs  Lab 11/01/20 1540 11/01/20 1947 11/01/20 2332 11/02/20 0342 11/02/20 0810  GLUCAP 118* 119* 121* 130* 135*    Iron Studies: No results for input(s): IRON, TIBC, TRANSFERRIN, FERRITIN in the last 72 hours. Studies/Results: MR BRAIN WO CONTRAST  Result Date: 10/31/2020 CLINICAL DATA:  Encephalopathy.  Mental status changes. EXAM: MRI HEAD WITHOUT CONTRAST TECHNIQUE: Multiplanar, multiecho pulse sequences of the brain and surrounding structures were obtained without intravenous contrast. COMPARISON:  Head CT yesterday. FINDINGS: Brain: The brain has a normal appearance without evidence of malformation, atrophy, old or acute small or large vessel infarction, mass lesion, hemorrhage, hydrocephalus or extra-axial collection. Vascular: Major vessels at the base of the brain show flow. Venous sinuses appear patent. Skull and upper cervical spine: Normal. Sinuses/Orbits: Mucosal thickening of the paranasal sinuses. Small mastoid effusions.  Both findings often seen with endotracheal intubation. Orbits negative. Other: None significant. IMPRESSION: 1. Normal appearance of the brain itself. 2. Mucosal thickening of the paranasal sinuses. Small mastoid effusions. Both findings often seen with endotracheal intubation. Electronically Signed   By: Nelson Chimes M.D.   On: 10/31/2020 14:39   DG Chest Port 1 View  Result Date: 11/02/2020 CLINICAL DATA:  Acute on chronic respiratory failure EXAM: PORTABLE CHEST 1 VIEW COMPARISON:  10/30/2020 FINDINGS: Endotracheal tube seen 3.3 cm above the carina. Nasogastric tube extends into the upper abdomen beyond the margin of the examination. Lung volumes are small, but are symmetric. Lungs are clear. No pneumothorax or pleural effusion. Cardiac size within normal limits. Pulmonary vascularity is normal. IMPRESSION: Stable support tubes. Stable pulmonary hypoinflation. Electronically Signed   By: Fidela Salisbury MD   On: 11/02/2020 03:44   . amLODipine  10 mg Per Tube Daily  . chlorhexidine gluconate (MEDLINE KIT)  15 mL Mouth Rinse BID  . Chlorhexidine Gluconate Cloth  6 each Topical Daily  . docusate  100 mg Per Tube BID  . heparin injection (subcutaneous)  5,000 Units Subcutaneous Q8H  . mouth rinse  15 mL Mouth Rinse 10 times per day  . metoprolol tartrate  25 mg Per Tube BID  . multivitamin  15 mL Per Tube Daily  . [START ON 11/03/2020] pantoprazole sodium  40 mg Per Tube Daily  . polyethylene glycol  17 g Per Tube Daily  . vitamin B-12  1,000 mcg Per Tube Daily    BMET    Component Value Date/Time   NA 135 11/02/2020 0129   K 4.2 11/02/2020 0129   CL 101 11/02/2020 0129   CO2 17 (L) 11/02/2020 0129   GLUCOSE 132 (H) 11/02/2020 0129   BUN 90 (H) 11/02/2020 0129   CREATININE 11.14 (H) 11/02/2020 0129   CALCIUM 7.0 (L) 11/02/2020 0129  GFRNONAA 5 (L) 11/02/2020 0129   CBC    Component Value Date/Time   WBC 9.5 11/02/2020 0129   RBC 3.21 (L) 11/02/2020 0129   HGB 10.0 (L) 11/02/2020  0129   HCT 29.1 (L) 11/02/2020 0129   PLT 341 11/02/2020 0129   MCV 90.7 11/02/2020 0129   MCH 31.2 11/02/2020 0129   MCHC 34.4 11/02/2020 0129   RDW 13.3 11/02/2020 0129    Assessment/Plan: 1.  Severe AKI secondary to rhabdomyolysis: Creatinine starting to improve and urine output picking up significantly 1. renal US without obstruction.   2. GN serologies negative thus far 3. CK levels declining 4. Decrease rate of normal saline to 100 cc/h 5. 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  May consider empiric treatment with dialysis today if neurology feels this is necessary for diagnostic purposes as below 2. Acute hyperactive delirium -likely multifactorial.  Toxic metabolic encephalopathy possibly contributing to some extent.  Neurology following, appreciate help 3. 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  GBS remains within the differential given his recent GI illness.  Discussing with neurology today whether empiric dialysis is needed.  His kidney function is recovering and he would likely clear uremic toxins on his own in the next 24 to 48 hours but if neurology feels there is a sense of urgency to try empiric dialysis to differentiate uremic neuropathy from GBS then we will start dialysis today.  Waiting to hear back from neurology on final thoughts 4. Acute rhabdomyolysis -CK downtrending.  Fluids as above 5. Hyponatremia - due to #1.  Resolved today 6. Hypocalcemia: Likely secondary to rhabdomyolysis.  Significantly improved 7. Hyperphosphatemia: Secondary to rhabdomyolysis.  Improving 8. Aspiration pneumonitis/sphenoid sinusitis - treated with ceftriaxone then changed to ancef/vanc due to concerns for septic shoulder.  MRI negative now on ceftriaxone 9. Hypertension: likely will improve with improving UOP. CTM

## 2020-11-02 NOTE — Care Plan (Signed)
RT got abg venous Rn tried and got the abg arterial

## 2020-11-02 NOTE — Progress Notes (Addendum)
NAME:  Andrew [name], MRN:  [id number], DOB:  [birth year], LOS: 6 ADMISSION DATE:  10/26/2020,   Brief History:  40 y/o male that presented to the ER with chest pain & acute hyperactive delirium requiring mechanical ventilation.  Subsequently found to be in rhabdo with AKI   Past Medical History:  PTSD Depression   Significant Hospital Events:  3/4 extubated, AKI worse, found to be in rhabdo 3/5 Continues to have diffuse muscle pain, renal consult 3/6 increased body pain particularly right shoulder, concern for septic arthritis, MRI neg 3/6 reintubated  Consults:  Renal Psych Neuro  Procedures:  ETT 3/3  >> 3/4         3/6 >>  Significant Diagnostic Tests:  Head CT >> sphenoid sinusitis Echo 3/3 >> nml LVEF, no WMA MRI right shoulder 3/6>> no evidence of septic joint, no effusion, severe edema and muscles around shoulder joint consistent with rhabdo  Head CT 3/6 >> neg Head MRI 3/7 >> neg  Micro Data:  COVID (-) Influenza (-) resp 3/3 >> GPC, GNR >>nml flora Blood 3/3 >> ng Blood 3/6 >> ng x1 day CSF gram stain >> no organisms  Antimicrobials:  ceftx 3/3 >> 3/6 azithro 3/3 >> Cefazolin 3/6 >> vanc 3/6  X 1 dose Ceftriaxone (meningitic dosing) 3/7>>3/9    SUBJ  Waxing/waning somnolence. At times able to follow commands - lift arm and move legs, other times only able to twitch arms and legs. Opens eyes and responds to verbal stimuli. Oxygenating well on PSV this AM but tachypneic in 20s to low 30s. UOP continues to improve Afebrile   Objective   Blood pressure (!) 164/107, pulse 76, temperature 97.9 F (36.6 C), temperature source Oral, resp. rate 20, height 5\' 10"  (1.778 m), weight 112.5 kg, SpO2 99 %.    Vent Mode: PSV;CPAP FiO2 (%):  [30 %-40 %] 30 % Set Rate:  [20 bmp] 20 bmp Vt Set:  [580 mL] 580 mL PEEP:  [5 cmH20] 5 cmH20 Pressure Support:  [10 cmH20-12 cmH20] 10 cmH20 Plateau Pressure:  [26 cmH20] 26 cmH20   Intake/Output Summary (Last  24 hours) at 11/02/2020 01/02/2021 Last data filed at 11/02/2020 0800 Gross per 24 hour  Intake 6437.58 ml  Output 1745 ml  Net 4692.58 ml   Filed Weights   10/31/20 0500 11/01/20 0500 11/02/20 0438  Weight: 107.9 kg 114.7 kg 112.5 kg    Examination: General: NAD, not-chronically ill appearing, intubated, sedated on propofol and fentanyl HENT: No pallor, no icterus, MMM Lungs: Bilateral ventilated breath sounds, no accessory muscle use Cardiovascular: RRR, normal S1S2, no MRG Abdomen: soft, mildly distended, tense on palpation Extremities: No edema/cyanosis/ clubbing, tense skin over right shoulder Neuro: Opens eyes wide, able to lift arms and legs off bed, RASS 0   Resolved problems   Assessment & Plan:   Presented with agitated delirium and rhabdo, suspect Wellbutrin and Seroquel as culprits but surreptitious drug use being in the differential although UDS negative x2 -Initially improved and extubated but then intubated for airway protection due to worsening somnolence on 3/6  Acute Respiratory Failure -reintubated 3/6 -PSV, spontaneous breaths with good oxygenation  -wean precedex and fentanyl as tolerated -maintain ETT until HD is complete and improved mental status   Acute encephalopathy/hyperactive Delirium  -neuro consulted, discussed regarding uremia and worsening weakness/abscence of DTRs on their exam -rising BUN (90) poses concern for polyneuropathy if uncorrected -intermittent HD today -also consider GBS, given ascending weakness/loss of DTRs and elevated protein  in CSF, though this is less likely -CSF culture negative x 2days -concern for intoxication/withdrawal given history of pain medication use and acute decompensation 3/5 after 2 days of lucidity -no acute etiology found on MRI, CT, EEG, or infectious work-up -hold Wellbutrin and Seroquel -wean precedex and fentanyl as able to -continue neuro checks, q4h delirium screening  AKI , rhabdomyolysis AG Metabolic  Acidosis 2/2 Acute Renal Failure -sCr peaked, 11.14<-11.51<-11.94 -CK downtrending, lactic acid WNL -net+ 23 L -given improving UOP, reduce IVF and follow I&O -Fentanyl for severe pain (prefers fentanyl to Dilaudid) -strict I&O, daily BMET, mag, and phos  Hypertension -no PMHx of HTN on chart review, no home antihypertensive medications -cont Amlodipine 10 mg QD -start metop tartrate 25 mg BID -hydralazine for SBP>170  Aspiration pneumonitis/sphenoid sinusitis -afebrile since 3/6 -normal white count -CXR this AM without infiltrates -leukocytosis and fever at admission likely 2/2 to rhabdo -In the absence of respiratory s/sxs concerning for pneumonia, will d/c abx as 7 day course has been completed for sinusitis -follow fever curve, WBC  Bloody/coffee-ground NG aspirate -no further emesis yesterday -Hgb stable -Protonix QD   Best practice (evaluated daily)  Diet: NPO Pain/Anxiety/Delirium protocol (if indicated):Fent gtt + precedex VAP protocol (if indicated): yes DVT prophylaxis: lovenox GI prophylaxis: protonix Glucose control: Monitor blood sugar Mobility: bedrest Disposition:ICU  Goals of Care:   Code Status: Full  Labs   CBC: Recent Labs  Lab 10/28/20 0706 10/29/20 0346 10/30/20 0159 10/30/20 2104 10/31/20 0530 11/02/20 0129  WBC 10.9* 11.3* 10.3  --  10.9* 9.5  HGB 12.4* 11.3* 9.6* 11.2* 10.3* 10.0*  HCT 36.7* 31.8* 27.0* 33.0* 30.0* 29.1*  MCV 89.7 88.3 87.1  --  88.5 90.7  PLT 349 190 251  --  311 341    Basic Metabolic Panel: Recent Labs  Lab 10/29/20 1047 10/30/20 0159 10/30/20 2104 10/31/20 0530 11/01/20 0152 11/01/20 0803 11/01/20 1106 11/02/20 0129  NA 131* 129* 129* 130*  --  132* 135 135  K 3.0* 3.2* 3.6 3.3*  --  4.0 4.0 4.2  CL 90* 83*  --  86*  --  94* 97* 101  CO2 23 27  --  22  --  17* 20* 17*  GLUCOSE 124* 110*  --  86  --  108* 117* 132*  BUN 61* 64*  --  72*  --  81* 83* 90*  CREATININE 8.88* 10.16*  --  11.69*  --   11.94* 11.51* 11.14*  CALCIUM 7.0* 6.5*  --  6.2*  --  6.7* 6.9* 7.0*  MG 2.6* 2.4  --  2.2 2.2  --   --  2.2  PHOS 6.0* 7.3*  --  7.0* 7.0*  --   --  6.8*   GFR: Estimated Creatinine Clearance: 10.8 mL/min (A) (by C-G formula based on SCr of 11.14 mg/dL (H)). Recent Labs  Lab 10/27/20 0445 10/27/20 0701 10/27/20 0706 10/27/20 1142 10/27/20 1655 10/29/20 0346 10/30/20 0159 10/31/20 0530 10/31/20 0949 11/01/20 0152 11/02/20 0129  PROCALCITON  --   --   --   --   --   --   --   --  3.94 3.92  --   WBC  --   --    < >  --    < > 11.3* 10.3 10.9*  --   --  9.5  LATICACIDVEN >11.0* 2.1*  --  2.0*  --   --   --   --   --   --  1.3   < > = values in this interval not displayed.    Liver Function Tests: Recent Labs  Lab 10/27/20 0527 10/31/20 0530  AST 56*  --   ALT 25  --   ALKPHOS 62  --   BILITOT 1.0  --   PROT 8.5*  --   ALBUMIN 4.5 2.8*   No results for input(s): LIPASE, AMYLASE in the last 168 hours. Recent Labs  Lab 10/27/20 1142 10/31/20 0949  AMMONIA 51* 37*    ABG    Component Value Date/Time   PHART 7.414 11/02/2020 0323   PCO2ART 30.1 (L) 11/02/2020 0323   PO2ART 157 (H) 11/02/2020 0323   HCO3 19.0 (L) 11/02/2020 0323   TCO2 36 (H) 10/30/2020 2104   ACIDBASEDEF 4.8 (H) 11/02/2020 0323   O2SAT 99.1 11/02/2020 0323     Coagulation Profile: Recent Labs  Lab 10/27/20 0527  INR 1.1    Cardiac Enzymes: Recent Labs  Lab 10/27/20 0302 10/28/20 0706 10/29/20 1047 10/30/20 0159 11/02/20 0129  CKTOTAL 141 32,777* 41,656* 28,110* 6,322*    HbA1C: No results found for: HGBA1C  CBG: Recent Labs  Lab 11/01/20 1132 11/01/20 1540 11/01/20 1947 11/01/20 2332 11/02/20 0342  GLUCAP 115* 118* 119* 121* 130*    Jori Moll, Medical Student 11/02/2020, 8:07 AM

## 2020-11-02 NOTE — Procedures (Signed)
Central Venous Catheter Insertion Procedure Note  Andrew [name]  [id number]  1981-03-21  Date:11/02/20  Time:12:31 PM   Provider Performing:Ardith Test Judie Petit Pecola Leisure   Procedure: Insertion of Non-tunneled Central Venous Catheter(36556)with US guidance (17510)    Indication(s) Medication administration and Hemodialysis  Consent Risks of the procedure as well as the alternatives and risks of each were explained to the patient and/or caregiver.  Consent for the procedure was obtained and is signed in the bedside chart  Anesthesia Topical only with 1% lidocaine   Timeout Verified patient identification, verified procedure, site/side was marked, verified correct patient position, special equipment/implants available, medications/allergies/relevant history reviewed, required imaging and test results available.  Sterile Technique Maximal sterile technique including full sterile barrier drape, hand hygiene, sterile gown, sterile gloves, mask, hair covering, sterile ultrasound probe cover (if used).       Procedure Description Area of catheter insertion was cleaned with chlorhexidine and draped in sterile fashion.   With real-time ultrasound guidance a HD catheter was placed into the right internal jugular vein.  Nonpulsatile blood flow and easy flushing noted in all ports.  The catheter was sutured in place and BioPatch/sterile dressing applied.  Complications/Tolerance None; patient tolerated the procedure well. Chest X-ray is ordered to verify placement for internal jugular or subclavian cannulation.  Chest x-ray is not ordered for femoral cannulation.  EBL Minimal  Specimen(s) None  The entire procedure was supervised/proctored by Joneen Roach, NP.  [name], PA-C [facility] 11/02/20 12:32 PM  Please see [email or web address] for pager details.

## 2020-11-03 ENCOUNTER — Inpatient Hospital Stay (HOSPITAL_COMMUNITY): Payer: Medicaid Other

## 2020-11-03 DIAGNOSIS — R531 Weakness: Secondary | ICD-10-CM

## 2020-11-03 LAB — GLUCOSE, CAPILLARY
Glucose-Capillary: 105 mg/dL — ABNORMAL HIGH (ref 70–99)
Glucose-Capillary: 132 mg/dL — ABNORMAL HIGH (ref 70–99)
Glucose-Capillary: 136 mg/dL — ABNORMAL HIGH (ref 70–99)
Glucose-Capillary: 141 mg/dL — ABNORMAL HIGH (ref 70–99)
Glucose-Capillary: 136 mg/dL — ABNORMAL HIGH (ref 70–99)
Glucose-Capillary: 117 mg/dL — ABNORMAL HIGH (ref 70–99)

## 2020-11-03 LAB — PHOSPHORUS: Phosphorus: 6.9 mg/dL — ABNORMAL HIGH (ref 2.5–4.6)

## 2020-11-03 LAB — CBC
HCT: 29.3 % — ABNORMAL LOW (ref 39.0–52.0)
Hemoglobin: 9.5 g/dL — ABNORMAL LOW (ref 13.0–17.0)
MCH: 30.2 pg (ref 26.0–34.0)
MCHC: 32.4 g/dL (ref 30.0–36.0)
MCV: 93 fL (ref 80.0–100.0)
Platelets: 238 10*3/uL (ref 150–400)
RBC: 3.15 MIL/uL — ABNORMAL LOW (ref 4.22–5.81)
RDW: 13.9 % (ref 11.5–15.5)
WBC: 11.1 10*3/uL — ABNORMAL HIGH (ref 4.0–10.5)
nRBC: 0 % (ref 0.0–0.2)

## 2020-11-03 LAB — BASIC METABOLIC PANEL
Anion gap: 16 — ABNORMAL HIGH (ref 5–15)
BUN: 93 mg/dL — ABNORMAL HIGH (ref 6–20)
CO2: 20 mmol/L — ABNORMAL LOW (ref 22–32)
Calcium: 7.7 mg/dL — ABNORMAL LOW (ref 8.9–10.3)
Chloride: 105 mmol/L (ref 98–111)
Creatinine, Ser: 10.29 mg/dL — ABNORMAL HIGH (ref 0.61–1.24)
GFR, Estimated: 6 mL/min — ABNORMAL LOW (ref >60)
Glucose, Bld: 132 mg/dL — ABNORMAL HIGH (ref 70–99)
Potassium: 4.3 mmol/L (ref 3.5–5.1)
Sodium: 141 mmol/L (ref 135–145)

## 2020-11-03 LAB — CSF CULTURE W GRAM STAIN: Culture: NO GROWTH

## 2020-11-03 LAB — CALCIUM, IONIZED: Calcium, Ionized, Serum: 3.8 mg/dL — ABNORMAL LOW (ref 4.5–5.6)

## 2020-11-03 LAB — MAGNESIUM: Magnesium: 2.1 mg/dL (ref 1.7–2.4)

## 2020-11-03 LAB — HEPATITIS B SURFACE ANTIGEN: Hepatitis B Surface Ag: NONREACTIVE

## 2020-11-03 LAB — TRIGLYCERIDES: Triglycerides: 278 mg/dL — ABNORMAL HIGH (ref <150)

## 2020-11-03 LAB — HEPATITIS B CORE ANTIBODY, TOTAL: Hep B Core Total Ab: NONREACTIVE

## 2020-11-03 LAB — CK: Total CK: 3027 U/L — ABNORMAL HIGH (ref 49–397)

## 2020-11-03 MED ORDER — HEPARIN SODIUM (PORCINE) 1000 UNIT/ML IJ SOLN
INTRAMUSCULAR | Status: AC
  Filled 2020-11-03: qty 4

## 2020-11-03 MED ORDER — DEXMEDETOMIDINE HCL IN NACL 400 MCG/100ML IV SOLN
0.0000 ug/kg/h | INTRAVENOUS | Status: DC
  Administered 2020-11-03 – 2020-11-04 (×6): 1.2 ug/kg/h via INTRAVENOUS
  Filled 2020-11-03: qty 100
  Filled 2020-11-03: qty 200
  Filled 2020-11-03 (×4): qty 100

## 2020-11-03 MED ORDER — PROSOURCE TF PO LIQD
45.0000 mL | Freq: Four times a day (QID) | ORAL | Status: DC
  Administered 2020-11-03 – 2020-11-07 (×9): 45 mL
  Filled 2020-11-03 (×12): qty 45

## 2020-11-03 MED ORDER — METOPROLOL TARTRATE 25 MG/10 ML ORAL SUSPENSION
50.0000 mg | Freq: Two times a day (BID) | ORAL | Status: DC
  Administered 2020-11-03 (×2): 50 mg
  Filled 2020-11-03 (×3): qty 20

## 2020-11-03 MED ORDER — CHLORHEXIDINE GLUCONATE CLOTH 2 % EX PADS
6.0000 | MEDICATED_PAD | Freq: Every day | CUTANEOUS | Status: DC
  Administered 2020-11-04 – 2020-11-07 (×2): 6 via TOPICAL

## 2020-11-03 MED ORDER — VITAL 1.5 CAL PO LIQD
1000.0000 mL | ORAL | Status: DC
  Administered 2020-11-03 – 2020-11-05 (×3): 1000 mL

## 2020-11-03 NOTE — Progress Notes (Signed)
PT Cancellation Note  Patient Details Name: Andrew [name] MRN: [id number] DOB: 02-20-1981   Cancelled Treatment:    Reason Eval/Treat Not Completed: Medical issues which prohibited therapy  Patient undergoing bedside dialysis. Noted increasing weakness per neurology note with ?uremic neuropathy vs GBS. Will follow and proceed with PT as appropriate.   Jerolyn Center, PT Pager [phone]  [name] 11/03/2020, 2:25 PM

## 2020-11-03 NOTE — Progress Notes (Signed)
Patient ID: Andrew [name], male   DOB: March 30, 1981, 40 y.o.   MRN: [id number] S: Patient's mentation seems to be worse today.  Neurology evaluated and feels weakness is also worsening.  Urine output remains adequate but creatinine slow to improve and BUN remains elevated O:BP (!) 183/105   Pulse 95   Temp 99.5 F (37.5 C) (Oral)   Resp (!) 27   Ht 5' 10"  (1.778 m)   Wt 123 kg   SpO2 100%   BMI 38.91 kg/m   Intake/Output Summary (Last 24 hours) at 11/03/2020 1039 Last data filed at 11/03/2020 1000 Gross per 24 hour  Intake 2094.36 ml  Output 1555 ml  Net 539.36 ml   Intake/Output: I/O last 3 completed shifts: In: 5203.7 [I.V.:4923.7; NG/GT:80; IV Piggyback:200] Out: 2380 [Urine:2380]  Intake/Output this shift:  Total I/O In: 181.4 [I.V.:181.4] Out: 275 [Urine:275] Weight change: 10.5 kg Gen: Sitting in bed, does not respond to questioning CVS: Normal rate Resp: Bilateral chest rise, ventilated, coarse breath sounds Abd: Mild distention, +BS, NT Ext: 1+ pitting edema in the bilateral lower extremities, warm and well perfused  Recent Labs  Lab 10/28/20 0706 10/28/20 1831 10/29/20 1047 10/30/20 0159 10/30/20 2104 10/31/20 0530 11/01/20 0152 11/01/20 0803 11/01/20 1106 11/02/20 0129 11/03/20 0143  NA 134*   < > 131* 129* 129* 130*  --  132* 135 135 141  K 3.2*   < > 3.0* 3.2* 3.6 3.3*  --  4.0 4.0 4.2 4.3  CL 101   < > 90* 83*  --  86*  --  94* 97* 101 105  CO2 17*   < > 23 27  --  22  --  17* 20* 17* 20*  GLUCOSE 105*   < > 124* 110*  --  86  --  108* 117* 132* 132*  BUN 52*   < > 61* 64*  --  72*  --  81* 83* 90* 93*  CREATININE 5.53*   < > 8.88* 10.16*  --  11.69*  --  11.94* 11.51* 11.14* 10.29*  ALBUMIN  --   --   --   --   --  2.8*  --   --   --   --   --   CALCIUM 8.0*   < > 7.0* 6.5*  --  6.2*  --  6.7* 6.9* 7.0* 7.7*  PHOS 5.3*  --  6.0* 7.3*  --  7.0* 7.0*  --   --  6.8* 6.9*   < > = values in this interval not displayed.   Liver Function Tests: Recent  Labs  Lab 10/31/20 0530  ALBUMIN 2.8*   No results for input(s): LIPASE, AMYLASE in the last 168 hours. Recent Labs  Lab 10/27/20 1142 10/31/20 0949  AMMONIA 51* 37*   CBC: Recent Labs  Lab 10/29/20 0346 10/30/20 0159 10/30/20 2104 10/31/20 0530 11/02/20 0129 11/03/20 0143  WBC 11.3* 10.3  --  10.9* 9.5 11.1*  HGB 11.3* 9.6*   < > 10.3* 10.0* 9.5*  HCT 31.8* 27.0*   < > 30.0* 29.1* 29.3*  MCV 88.3 87.1  --  88.5 90.7 93.0  PLT 190 251  --  311 341 238   < > = values in this interval not displayed.   Cardiac Enzymes: Recent Labs  Lab 10/28/20 0706 10/29/20 1047 10/30/20 0159 11/02/20 0129  CKTOTAL 32,777* 41,656* 28,110* 6,322*   CBG: Recent Labs  Lab 11/02/20 1529 11/02/20 1946 11/02/20 2336 11/03/20 0334 11/03/20  0750  GLUCAP 144* 121* 124* 105* 132*    Iron Studies: No results for input(s): IRON, TIBC, TRANSFERRIN, FERRITIN in the last 72 hours. Studies/Results: DG CHEST PORT 1 VIEW  Result Date: 11/02/2020 CLINICAL DATA:  Status post central line placement. EXAM: PORTABLE CHEST 1 VIEW COMPARISON:  Single-view of the chest earlier today. FINDINGS: New right IJ approach central venous catheter tip projects in the mid to upper superior vena cava. No pneumothorax. Endotracheal tube and NG tube are unchanged. Lungs are clear. IMPRESSION: Right IJ catheter tip projects in the mid to upper superior vena cava. No pneumothorax. ETT and NG tube are unchanged. Lungs are clear. Electronically Signed   By: Inge Rise M.D.   On: 11/02/2020 13:14   DG Chest Port 1 View  Result Date: 11/02/2020 CLINICAL DATA:  Acute on chronic respiratory failure EXAM: PORTABLE CHEST 1 VIEW COMPARISON:  10/30/2020 FINDINGS: Endotracheal tube seen 3.3 cm above the carina. Nasogastric tube extends into the upper abdomen beyond the margin of the examination. Lung volumes are small, but are symmetric. Lungs are clear. No pneumothorax or pleural effusion. Cardiac size within normal limits.  Pulmonary vascularity is normal. IMPRESSION: Stable support tubes. Stable pulmonary hypoinflation. Electronically Signed   By: Fidela Salisbury MD   On: 11/02/2020 03:44   . amLODipine  10 mg Per Tube Daily  . chlorhexidine gluconate (MEDLINE KIT)  15 mL Mouth Rinse BID  . Chlorhexidine Gluconate Cloth  6 each Topical Daily  . Chlorhexidine Gluconate Cloth  6 each Topical Q0600  . docusate  100 mg Per Tube BID  . heparin injection (subcutaneous)  5,000 Units Subcutaneous Q8H  . mouth rinse  15 mL Mouth Rinse 10 times per day  . metoprolol tartrate  50 mg Per Tube BID  . multivitamin  15 mL Per Tube Daily  . pantoprazole sodium  40 mg Per Tube Daily  . polyethylene glycol  17 g Per Tube Daily  . vitamin B-12  1,000 mcg Per Tube Daily    BMET    Component Value Date/Time   NA 141 11/03/2020 0143   K 4.3 11/03/2020 0143   CL 105 11/03/2020 0143   CO2 20 (L) 11/03/2020 0143   GLUCOSE 132 (H) 11/03/2020 0143   BUN 93 (H) 11/03/2020 0143   CREATININE 10.29 (H) 11/03/2020 0143   CALCIUM 7.7 (L) 11/03/2020 0143   GFRNONAA 6 (L) 11/03/2020 0143   CBC    Component Value Date/Time   WBC 11.1 (H) 11/03/2020 0143   RBC 3.15 (L) 11/03/2020 0143   HGB 9.5 (L) 11/03/2020 0143   HCT 29.3 (L) 11/03/2020 0143   PLT 238 11/03/2020 0143   MCV 93.0 11/03/2020 0143   MCH 30.2 11/03/2020 0143   MCHC 32.4 11/03/2020 0143   RDW 13.9 11/03/2020 0143    Assessment/Plan: 1.  Severe AKI secondary to rhabdomyolysis: Creatinine improving and urine output adequate but BUN remains elevated and concern for uremia contributing to weakness and possibly areflexia as below 1. renal US without obstruction.   2. GN serologies negative thus far 3. CK levels declining 4. Stop fluids 5. Empiric treatment with dialysis today; plan for dialysis again tomorrow pending his response 2. Weakness and areflexia: Appears to be ascending according to neurology.  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  We are planning to do  dialysis as above.  Other concern could be GBS.  If the patient fails to improve with dialysis PLEX with IVIG may need to be considered; appreciate help  from neurology 3. Acute hyperactive delirium -likely multifactorial.  Toxic metabolic encephalopathy possibly contributing to some extent.  Neurology following, appreciate help 4. Acute rhabdomyolysis -CK downtrending.   5. Hypocalcemia: Likely secondary to rhabdomyolysis.  Significantly improved 6. Hyperphosphatemia: Secondary to rhabdomyolysis.  Improving 7. Aspiration pneumonitis/sphenoid sinusitis - treated with ceftriaxone then changed to ancef/vanc due to concerns for septic shoulder.  MRI negative now on ceftriaxone 8. Hypertension: likely will improve with improving UOP. CTM

## 2020-11-03 NOTE — Progress Notes (Signed)
Assisted tele visit to patient with partner.  Brynlea Spindler P, RN   

## 2020-11-03 NOTE — Progress Notes (Signed)
NAME:  ELLIE SPICKLER, MRN:  [id number], DOB:  May 22, 1981, LOS: 7 ADMISSION DATE:  10/26/2020,   Brief History:  40 y/o male that presented to the ER with chest pain & acute hyperactive delirium requiring mechanical ventilation.  Subsequently found to be in rhabdo with AKI   Past Medical History:  PTSD Depression   Significant Hospital Events:  3/4 extubated, AKI worse, found to be in rhabdo 3/5 Continues to have diffuse muscle pain, renal consult 3/6 increased body pain particularly right shoulder, concern for septic arthritis, MRI neg  Consults:  Renal Psych Neuro  Procedures:  ETT 3/3  >> 3/4         3/6 >>  Significant Diagnostic Tests:  Head CT >> sphenoid sinusitis Echo 3/3 >> nml LVEF, no WMA MRI right shoulder 3/6>> no evidence of septic joint, no effusion, severe edema and muscles around shoulder joint consistent with rhabdo  Head CT 3/6 >> neg CSF cell count 3/7 >> WBC 2, RBC 2 (hypocellular) MRI brain 3/7 >> normal, paranasal sinus mucosal thickening, small mastoid effusions  Micro Data:  COVID (-) Influenza (-) resp 3/3 >> GPC, GNR >>nml flora Blood 3/3 >> negative Blood 3/6 >> CSF 3/7 >>   Antimicrobials:  ceftx 3/3 >> 3/6 azithro 3/3 x 1 Cefazolin 3/6 >> 3/7 Ceftriaxone 3/7 >> 3/9 vanc 3/6  X 1 dose vanco 3/7 >> 3/8   SUBJECTIVE/interval events  Has tolerated some PSV HD catheter placed 3/9 but no dialysis has been done yet, plan for this morning Urine output 1530 cc, serum creatinine slightly improved from 10.29 Remains on fentanyl 200, Precedex 1.0 0.40+5 PEEP   Objective   Blood pressure (!) 163/102, pulse 68, temperature 99.5 F (37.5 C), temperature source Oral, resp. rate 18, height 5\' 10"  (1.778 m), weight 123 kg, SpO2 100 %.    Vent Mode: PRVC FiO2 (%):  [30 %-40 %] 40 % Set Rate:  [18 bmp] 18 bmp Vt Set:  [500 mL] 500 mL PEEP:  [5 cmH20] 5 cmH20 Pressure Support:  [10 cmH20] 10 cmH20 Plateau Pressure:  [29 cmH20-31  cmH20] 29 cmH20   Intake/Output Summary (Last 24 hours) at 11/03/2020 01/03/2021 Last data filed at 11/03/2020 0800 Gross per 24 hour  Intake 1982.49 ml  Output 1805 ml  Net 177.49 ml   Filed Weights   11/01/20 0500 11/02/20 0438 11/03/20 0405  Weight: 114.7 kg 112.5 kg 123 kg    Examination: General: Obese man, intubated and somewhat uncomfortable HENT: ET tube is in place, OG tube in place, pupils equal, oropharynx moist Lungs: Distant, decreased at both bases with some scattered inspiratory crackles, no wheezes Cardiovascular: Distant, regular, borderline tachycardic, bilateral upper and lower extremity edema 1+ Abdomen: Tympanic, somewhat distended, nontender, hypoactive bowel sounds Extremities: No rash.  Some asymmetric swelling left elbow without warmth Neuro: Eyes are open, nods to questions, tries to mouth words and communicate.  Will not move his upper or lower extremities on command, question volitional.  He does shake his head when requested to follow commands.  As per neurology notes 3/8 and 3/9 decreasing reflexes and strength   Resolved problems   Assessment & Plan:   Presented with agitated delirium and rhabdo, suspecting Wellbutrin and Seroquel as culprits but surreptitious drug use being in the differential although UDS negative x2.  No evidence occult seizure activity -Initially improved and extubated but then intubated for airway protection due to worsening somnolence on 3/6  Acute Resp Failure -reintubated 3/6 -Continues to  tolerate PSV but I am hesitant to extubate given his volume status, progressive weakness, sedation needs.  Continue PSV as he can tolerate, use PRVC as rest mode.  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  Based on this I believe he needs to be dialyzed and will push for this 3/10 -Holding Wellbutrin, Seroquel -Currently on fentanyl, Precedex infusions, wean as able.  Xanax available -No evidence for meningitis on CSF, no clear indication for acyclovir -Completing empiric antibiotics for possible infection as below B12 supplementation  Aspiration pneumonitis without clear pneumonia/sphenoid sinusitis -7-day course antibiotics completed on 3/9 -Continue to follow culture data, clinical status  Hypertension, not on home agents -Continue hydralazine as needed -Continue amlodipine 10 mg once daily -Increase metoprolol 3/10 to 50 mg twice daily  AKI , rhabdomyolysis Acute metabolic acidosis Cause unclear.  Question meds.  No overt seizure activity noted.  Question shoulder injury based on his complaints of pain -Repeat CK 3/9 shows continued improvement, clearance -23 L positive, will stop his aggressive volume resuscitation 3/9 and follow -Bicarbonate infusion completed -Appreciate nephrology assistance.  He may be headed for renal recovery but his progressive neuropathy is an issue.  He may need hemodialysis in order to prevent neurological decline.  Will discuss with nephrology today.  Bloody/coffee-ground NG aspirate -PPI converted to daily -Continue to follow intermittent CBC -Tolerating prophylactic subcutaneous heparin   Best practice (evaluated daily)  Diet: NPO Pain/Anxiety/Delirium protocol  (if indicated):Fent gtt + precedex VAP protocol (if indicated): yes DVT prophylaxis: lovenox GI prophylaxis: protonix Glucose control: Monitor blood sugar Mobility: bedrest Disposition:ICU Family: Discussed with patient's father 3/9 and 3/10.   Goals of Care:   Code Status: Full  Labs   CBC: Recent Labs  Lab 10/29/20 0346 10/30/20 0159 10/30/20 2104 10/31/20 0530 11/02/20 0129 11/03/20 0143  WBC 11.3* 10.3  --  10.9* 9.5 11.1*  HGB 11.3* 9.6* 11.2* 10.3* 10.0* 9.5*  HCT 31.8* 27.0* 33.0* 30.0* 29.1* 29.3*  MCV 88.3 87.1  --  88.5 90.7 93.0  PLT 190 251  --  311 341 238    Basic Metabolic Panel: Recent Labs  Lab 10/30/20 0159 10/30/20 2104 10/31/20 0530 11/01/20 0152 11/01/20 0803 11/01/20 1106 11/02/20 0129 11/03/20 0143  NA 129*   < > 130*  --  132* 135 135 141  K 3.2*   < > 3.3*  --  4.0 4.0 4.2 4.3  CL 83*  --  86*  --  94* 97* 101 105  CO2 27  --  22  --  17* 20* 17* 20*  GLUCOSE 110*  --  86  --  108* 117* 132* 132*  BUN 64*  --  72*  --  81* 83* 90* 93*  CREATININE 10.16*  --  11.69*  --  11.94* 11.51* 11.14* 10.29*  CALCIUM 6.5*  --  6.2*  --  6.7* 6.9* 7.0* 7.7*  MG 2.4  --  2.2 2.2  --   --  2.2 2.1  PHOS 7.3*  --  7.0* 7.0*  --   --  6.8* 6.9*   < > = values in this interval not displayed.   GFR: Estimated Creatinine Clearance: 12.3 mL/min (A) (by C-G formula based on SCr of 10.29 mg/dL (H)). Recent Labs  Lab 10/27/20 1142 10/27/20 1655 10/30/20 0159 10/31/20 0530 10/31/20 0949 11/01/20 0152 11/02/20 0129 11/03/20 0143  PROCALCITON  --   --   --   --  3.94 3.92 3.65  --   WBC  --    < > 10.3 10.9*  --   --  9.5 11.1*  LATICACIDVEN 2.0*  --   --   --   --   --  1.3  --    < > = values in this interval not displayed.    Liver Function Tests: Recent Labs  Lab 10/31/20 0530  ALBUMIN 2.8*   No results for input(s): LIPASE, AMYLASE in the last 168 hours. Recent Labs  Lab 10/27/20 1142 10/31/20 0949  AMMONIA 51* 37*    ABG     Component Value Date/Time   PHART 7.414 11/02/2020 0323   PCO2ART 30.1 (L) 11/02/2020 0323   PO2ART 157 (H) 11/02/2020 0323   HCO3 19.0 (L) 11/02/2020 0323   TCO2 36 (H) 10/30/2020 2104   ACIDBASEDEF 4.8 (H) 11/02/2020 0323   O2SAT 99.1 11/02/2020 0323     Coagulation Profile: No results for input(s): INR, PROTIME in the last 168 hours.  Cardiac Enzymes: Recent Labs  Lab 10/28/20 0706 10/29/20 1047 10/30/20 0159 11/02/20 0129  CKTOTAL 32,777* 41,656* 28,110* 6,322*    HbA1C: No results found for: HGBA1C  CBG: Recent Labs  Lab 11/02/20 1529 11/02/20 1946 11/02/20 2336 11/03/20 0334 11/03/20 0750  GLUCAP 144* 121* 124* 105* 132*       Critical care time: 33 min    Levy Pupa, MD, PhD 11/03/2020, 8:24 AM [facility] [phone] or if no answer before 7:00PM call [phone] For any issues after 7:00PM please call eLink [phone]

## 2020-11-03 NOTE — Progress Notes (Addendum)
Neurology Progress Note  Reason for Consult:New onset hypoactive delirum Requesting Physician:Rakesh Vassie Loll, MD  Subjective: Andrew J Crawfordis a 40 y.o.malethat presented to the ER with two weeks of chest painand dizziness that started day of presentation with PMHx of MDD and PTSD. On exam today patient appears encephalopathic and responds to some commands but intermittently does not and does not appear to hear the commands as he often looks into the distance or his eyes appear half closed. Patient does attempt to communicate that he is pain and becomes teary. RN reports that patient was very tearful earlier. Patient is unable to communicate what exactly hurts or why he is crying.  Patient had a non-tunneled CV cath placed to start HD today.   Exam: Vitals:   11/03/20 0802 11/03/20 0942  BP:  (!) 185/104  Pulse:  98  Resp:    Temp:    SpO2: 100%    Gen: In bed, NAD, teary eyed and appearing to cry at times and other times appeared confused or unaware Resp: non-labored breathing, no acute distress Abd: more rigid, distended, nt Extremities: Edematous, Upper extremities appear moreso today than in the past. Eyes: No scleral injections HENT: Remains intubated CV: RRR Skin: Warm and dry Psych: Patient appears sad at times and unaware at others. Patient will respond to some commands and then appear to not have heard other commands. At times patient appears teary eyed on exam.   Neurological exam Patient is awake but drowsy and will open his eye to verbal stimuli and redirection most of the exam. Patient isunable to give a clear and coherent historydue to intubation and agitation/tearfulness. Within these limits, no clear signs of neglect II: Visual Fields are grossly full. Pupils are equal, round, and reactive to light.  III,IV, VI: EOMI grossly intact.  V:Equal response to eyelash brush bilaterally VII: Facial movement is symmetricin the upper face, lower face limited  secondary to ET tube.  VIII: hearing is intact to voice XI/X:Cough reflex is intact, gag not assessed today  Reflexes: Absent in bilateral patellas, Achilles. 1+ brachioradialis and triceps Notably, while attending was able to elicit reflexes 3/7, today they are again absent  Motor: Tone is normal. Bulk isdifficult to assess due toto edema but likely normal. Patient able to keep his arms up for about 5 seconds each side, but the movements are slower and more tenuous than yesterday, with increased grimacing secondary to pain. Patient does not move his BLE.  On confrontational testing, there was some giveaway likely secondary to pain/confusion, but with extensive coaching does appear to be weaker than 3/9. 0/5 BLE, did not attempt to move legs at all/ no movement noted at the hip; given his tearfulness noxious stimulation was deferred  Sensory: Appears grossly equally reactive to stimulation in all 4 extremities  Pertinent Labs: CBC Latest Ref Rng & Units 11/03/2020 11/02/2020 10/31/2020  WBC 4.0 - 10.5 K/uL 11.1(H) 9.5 10.9(H)  Hemoglobin 13.0 - 17.0 g/dL 1.0(G) 10.0(L) 10.3(L)  Hematocrit 39.0 - 52.0 % 29.3(L) 29.1(L) 30.0(L)  Platelets 150 - 400 K/uL 238 341 311   CMP Latest Ref Rng & Units 11/03/2020 11/02/2020 11/01/2020  Glucose 70 - 99 mg/dL 269(S) 854(O) 270(J)  BUN 6 - 20 mg/dL 50(K) 93(G) 18(E)  Creatinine 0.61 - 1.24 mg/dL 99.37(J) 69.67(E) 93.81(O)  Sodium 135 - 145 mmol/L 141 135 135  Potassium 3.5 - 5.1 mmol/L 4.3 4.2 4.0  Chloride 98 - 111 mmol/L 105 101 97(L)  CO2 22 - 32 mmol/L 20(L)  17(L) 20(L)  Calcium 8.9 - 10.3 mg/dL 7.7(L) 7.0(L) 6.9(L)  Total Protein 6.5 - 8.1 g/dL - - -  Total Bilirubin 0.3 - 1.2 mg/dL - - -  Alkaline Phos 38 - 126 U/L - - -  AST 15 - 41 U/L - - -  ALT 0 - 44 U/L - - -    Impression:  Andrew [name] is a 40 yo patient with a hx of MDD and PTSD. Patient's clinical exam continues to promote worsneing weakness BLE> BUE and hyporeflexia. Difficulty  assessing reflexes is compounded by patient's edema. Patient also had small elevation in his WBC 9.5>11.1. There is some potential for compartment syndrome as patient has had a large amount of fluids pushed, but has had minimal output due to his poor kidney function. Will reassess CK today. Patient's poor kidney function is also believe to be etiology behind symptoms supportive of uremic neuropathy. Patient's progressive weakness and areflexia especially in his lower limbs continues to support this dx. Patient's mental status has also worsened and is likely 2.2 to uremia. Patient had cath placed yesterday and Nephrology agrees that patient should start HD, but will continue to monitor as patient is naive and some of his kidney function had improved during hospitalization.   Recommendations: - F/u CK - Continue B12 supplementation - F/U MMA - Delirium precautions: consistent reorientation, quiet sleeping atmosphere, minimizing excess noise and restraints - Appreciate plan to [facility] psychiatry once patient improves enough to be extubated - Appreciate nephrology direction regarding hemodialysis - Appreciate excellent care of the patient's other comorbidities and ventilator by CCM team -Neurology will continue to follow  [name], MD PGY-1  Attending Neurologist's note:  I personally saw this patient, gathering history, performing a full neurologic examination, reviewing relevant labs, and formulated the assessment and plan, adding the note above for completeness and clarity to accurately reflect my thoughts   CRITICAL CARE Performed by: Gordy Councilman  Total critical care time: 33 minutes  Critical care time was exclusive of separately billable procedures and treating other patients.  Critical care was necessary to treat or prevent imminent or life-threatening deterioration.  Critical care was time spent personally by me on the following activities: development of treatment plan  with patient and/or surrogate as well as nursing, discussions with consultants, evaluation of patient's response to treatment, examination of patient, obtaining history from patient or surrogate, ordering and performing treatments and interventions, ordering and review of laboratory studies, ordering and review of radiographic studies, pulse oximetry and re-evaluation of patient's condition.

## 2020-11-03 NOTE — Progress Notes (Signed)
NAME:  Andrew [name], MRN:  [id number], DOB:  12/21/1980, LOS: 7 ADMISSION DATE:  10/26/2020,   Brief History:  40 y/o male that presented to the ER with chest pain & acute hyperactive delirium requiring mechanical ventilation.  Subsequently found to be in rhabdo with AKI   Past Medical History:  PTSD Depression   Significant Hospital Events:  3/4 extubated, AKI worse, found to be in rhabdo 3/5 Continues to have diffuse muscle pain, renal consult 3/6 increased body pain particularly right shoulder, concern for septic arthritis, MRI neg 3/6 reintubated  Consults:  Renal Psych Neuro  Procedures:  ETT 3/3  >> 3/4         3/6 >>  Significant Diagnostic Tests:  Head CT >> sphenoid sinusitis Echo 3/3 >> nml LVEF, no WMA MRI right shoulder 3/6>> no evidence of septic joint, no effusion, severe edema and muscles around shoulder joint consistent with rhabdo  Head CT 3/6 >> neg Head MRI 3/7 >> neg  Micro Data:  COVID (-) Influenza (-) resp 3/3 >> GPC, GNR >>nml flora Blood 3/3 >> ng Blood 3/6 >> ng x1 day CSF gram stain >> no organisms  Antimicrobials:  ceftx 3/3 >> 3/6 azithro 3/3 >> Cefazolin 3/6 >> vanc 3/6  X 1 dose Ceftriaxone (meningitic dosing) 3/7>>3/9    SUBJ  Waxing/waning somnolence. Wakes up to his name but will not follow commands this morning. PRVC overnight. Switched to PSV this morning with good oxygenation. RR mid 20s. Fentanyl overnight at 200. Increased agitation when decreased to 100 mg this AM. UOP continues to improve, 1.8 L last 24 hours Net+ 22 L Afebrile   Objective   Blood pressure (!) 163/102, pulse 68, temperature 99.5 F (37.5 C), temperature source Oral, resp. rate 18, height 5\' 10"  (1.778 m), weight 123 kg, SpO2 100 %.    Vent Mode: PRVC FiO2 (%):  [30 %-40 %] 40 % Set Rate:  [18 bmp] 18 bmp Vt Set:  [500 mL] 500 mL PEEP:  [5 cmH20] 5 cmH20 Pressure Support:  [10 cmH20] 10 cmH20 Plateau Pressure:  [29 cmH20-31 cmH20] 29  cmH20   Intake/Output Summary (Last 24 hours) at 11/03/2020 01/03/2021 Last data filed at 11/03/2020 0800 Gross per 24 hour  Intake 1982.49 ml  Output 1805 ml  Net 177.49 ml   Filed Weights   11/01/20 0500 11/02/20 0438 11/03/20 0405  Weight: 114.7 kg 112.5 kg 123 kg    Examination: General: NAD, not-chronically ill appearing, intubated, sedated on fentanyl and precedex HENT: No pallor, no icterus, pupils equal, MMM Lungs: Bilateral ventilated breath sounds, no accessory muscle use Cardiovascular: RRR, normal S1S2, no MRG Abdomen: soft, mildly distended more than yesterday, tense on palpation Extremities: Edematous with tense skin in all extremities and abdomen, no cyanosis/ clubbing, low amplitude tremor in extremities, worsened with agitation or stimulation Neuro: opens eyes when spoken to, unable to lift arms and legs off bed, RASS -1   Resolved problems   Assessment & Plan:   Presented with agitated delirium and rhabdo, suspect Wellbutrin and Seroquel as culprits but surreptitious drug use being in the differential although UDS negative x2 -Initially improved and extubated but then intubated for airway protection due to worsening somnolence on 3/6  Acute Respiratory Failure -reintubated 3/6 -PSV, spontaneous breaths with good oxygenation  -wean precedex and fentanyl as tolerated -maintain ETT until HD is complete with improved mental status   Acute encephalopathy/hyperactive Delirium 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 -neuro consulted, discussed regarding uremic neuropathy (BUN  93) with worsening weakness/abscence of DTRs on their exam -also consider GBS, given ascending weakness/loss of DTRs and elevated protein in CSF, follow neurologic status after HD -aseptic meningitis possible but less likely -CSF culture negative x 3 days -concern for intoxication/withdrawal given history of pain medication use and acute decompensation 3/5 after 2 days of lucidity -no acute etiology  found on MRI, CT, EEG, or infectious work-up -hold Wellbutrin and Seroquel -wean precedex and fentanyl as able to -continue neuro checks, q4h delirium screening  Severe AKI , rhabdomyolysis AG Metabolic Acidosis 2/2 Acute Renal Failure -sCr improving, 10.29<-11.14<-11.51 -significantly volume overloaded with diffuse edema, net+ 22 L -cont. IVF at 30 mL/hr -HD today, f/u volume status -Fentanyl for severe pain (prefers fentanyl to Dilaudid) -strict I&O, daily BMET, mag, and phos  Hypertension -no PMHx of HTN on chart review, no home antihypertensive medications -minor decrease in SBP overnight since starting Metoprolol -cont Amlodipine 10 mg QD -increase metop tartrate 50 mg BID -hydralazine for SBP>170  Aspiration pneumonitis/sphenoid sinusitis -afebrile since 3/6 -mild leukocytosis this AM to 11.1 -s/p 7 days abx -follow fever curve, WBC  Bloody/coffee-ground NG aspirate -no further emesis -Hgb stable -Protonix QD   Best practice (evaluated daily)  Diet: NPO Pain/Anxiety/Delirium protocol (if indicated):Fent gtt + precedex VAP protocol (if indicated): yes DVT prophylaxis: lovenox GI prophylaxis: protonix Glucose control: Monitor blood sugar Mobility: bedrest Disposition:ICU  Goals of Care:   Code Status: Full  Labs   CBC: Recent Labs  Lab 10/29/20 0346 10/30/20 0159 10/30/20 2104 10/31/20 0530 11/02/20 0129 11/03/20 0143  WBC 11.3* 10.3  --  10.9* 9.5 11.1*  HGB 11.3* 9.6* 11.2* 10.3* 10.0* 9.5*  HCT 31.8* 27.0* 33.0* 30.0* 29.1* 29.3*  MCV 88.3 87.1  --  88.5 90.7 93.0  PLT 190 251  --  311 341 238    Basic Metabolic Panel: Recent Labs  Lab 10/30/20 0159 10/30/20 2104 10/31/20 0530 11/01/20 0152 11/01/20 0803 11/01/20 1106 11/02/20 0129 11/03/20 0143  NA 129*   < > 130*  --  132* 135 135 141  K 3.2*   < > 3.3*  --  4.0 4.0 4.2 4.3  CL 83*  --  86*  --  94* 97* 101 105  CO2 27  --  22  --  17* 20* 17* 20*  GLUCOSE 110*  --  86  --  108*  117* 132* 132*  BUN 64*  --  72*  --  81* 83* 90* 93*  CREATININE 10.16*  --  11.69*  --  11.94* 11.51* 11.14* 10.29*  CALCIUM 6.5*  --  6.2*  --  6.7* 6.9* 7.0* 7.7*  MG 2.4  --  2.2 2.2  --   --  2.2 2.1  PHOS 7.3*  --  7.0* 7.0*  --   --  6.8* 6.9*   < > = values in this interval not displayed.   GFR: Estimated Creatinine Clearance: 12.3 mL/min (A) (by C-G formula based on SCr of 10.29 mg/dL (H)). Recent Labs  Lab 10/27/20 1142 10/27/20 1655 10/30/20 0159 10/31/20 0530 10/31/20 0949 11/01/20 0152 11/02/20 0129 11/03/20 0143  PROCALCITON  --   --   --   --  3.94 3.92 3.65  --   WBC  --    < > 10.3 10.9*  --   --  9.5 11.1*  LATICACIDVEN 2.0*  --   --   --   --   --  1.3  --    < > =  values in this interval not displayed.    Liver Function Tests: Recent Labs  Lab 10/31/20 0530  ALBUMIN 2.8*   No results for input(s): LIPASE, AMYLASE in the last 168 hours. Recent Labs  Lab 10/27/20 1142 10/31/20 0949  AMMONIA 51* 37*    ABG    Component Value Date/Time   PHART 7.414 11/02/2020 0323   PCO2ART 30.1 (L) 11/02/2020 0323   PO2ART 157 (H) 11/02/2020 0323   HCO3 19.0 (L) 11/02/2020 0323   TCO2 36 (H) 10/30/2020 2104   ACIDBASEDEF 4.8 (H) 11/02/2020 0323   O2SAT 99.1 11/02/2020 0323     Coagulation Profile: No results for input(s): INR, PROTIME in the last 168 hours.  Cardiac Enzymes: Recent Labs  Lab 10/28/20 0706 10/29/20 1047 10/30/20 0159 11/02/20 0129  CKTOTAL 32,777* 41,656* 28,110* 6,322*    HbA1C: No results found for: HGBA1C  CBG: Recent Labs  Lab 11/02/20 1529 11/02/20 1946 11/02/20 2336 11/03/20 0334 11/03/20 0750  GLUCAP 144* 121* 124* 105* 132*    Jori Moll, Medical Student 11/03/2020, 8:26 AM

## 2020-11-03 NOTE — Progress Notes (Signed)
Nutrition Follow-up   DOCUMENTATION CODES:   Obesity unspecified  INTERVENTION:   Tube feeding via OG tube: Change to Vital 1.5 at 60 ml/h with Prosource 45 ml QID to provide 2320 kcal, 141 gm protein, 1100 ml free water daily.  NUTRITION DIAGNOSIS:   Inadequate oral intake related to inability to eat as evidenced by NPO status.  Ongoing   GOAL:   Patient will meet greater than or equal to 90% of their needs  Progressing   MONITOR:   PO intake,Supplement acceptance,Labs,Weight trends,I & O's  REASON FOR ASSESSMENT:   Consult Enteral/tube feeding initiation and management  ASSESSMENT:   40 year old male who presented to the ED on 3/2 with chest pain x 2 weeks and developed acute hyperactive delirium. No pertinent PMH. Pt intubated for airway protection.  Discussed patient in ICU rounds and with RN today. Weight up with fluid retention.  Trialysis catheter in place. Plans to begin HD today and repeat HD again tomorrow. Currently receiving Vital AF 1.2 at 80 ml/h providing 2304 kcal, 144 gm protein, 1557 ml free water daily. Will change TF to a more concentrated formula to decrease fluid provision.   Patient remains intubated on ventilator support MV: 10.3 L/min Temp (24hrs), Avg:98.7 F (37.1 C), Min:98 F (36.7 C), Max:99.5 F (37.5 C)   Labs reviewed. phos 6.9, BUN 93, creatinine 10.29  CBG: [phone]  Medications reviewed and include liquid MVI, vitamin B12 tablet.  Weight up to 123 kg today; 102 kg on admission I/O +24.4 L since admission   Diet Order:   Diet Order            Diet NPO time specified  Diet effective now                 EDUCATION NEEDS:   No education needs have been identified at this time  Skin:  Skin Assessment: Reviewed RN Assessment  Last BM:  3/10 type 7  Height:   Ht Readings from Last 1 Encounters:  10/31/20 5\' 10"  (1.778 m)    Weight:   Wt Readings from Last 1 Encounters:  11/03/20 123 kg    BMI:  Body  mass index is 38.91 kg/m.  Estimated Nutritional Needs:   Kcal:  2305  Protein:  140-160 gm  Fluid:  >/= 2.2 L    2306, RD, LDN, CNSC Please refer to Amion for contact information.

## 2020-11-04 ENCOUNTER — Inpatient Hospital Stay (HOSPITAL_COMMUNITY): Payer: Medicaid Other

## 2020-11-04 DIAGNOSIS — D649 Anemia, unspecified: Secondary | ICD-10-CM

## 2020-11-04 DIAGNOSIS — Z9911 Dependence on respirator [ventilator] status: Secondary | ICD-10-CM

## 2020-11-04 LAB — CULTURE, BLOOD (ROUTINE X 2)
Culture: NO GROWTH
Culture: NO GROWTH
Special Requests: ADEQUATE
Special Requests: ADEQUATE

## 2020-11-04 LAB — GLUCOSE, CAPILLARY
Glucose-Capillary: 126 mg/dL — ABNORMAL HIGH (ref 70–99)
Glucose-Capillary: 131 mg/dL — ABNORMAL HIGH (ref 70–99)
Glucose-Capillary: 150 mg/dL — ABNORMAL HIGH (ref 70–99)
Glucose-Capillary: 115 mg/dL — ABNORMAL HIGH (ref 70–99)
Glucose-Capillary: 128 mg/dL — ABNORMAL HIGH (ref 70–99)
Glucose-Capillary: 120 mg/dL — ABNORMAL HIGH (ref 70–99)

## 2020-11-04 LAB — CBC
HCT: 28 % — ABNORMAL LOW (ref 39.0–52.0)
Hemoglobin: 9.1 g/dL — ABNORMAL LOW (ref 13.0–17.0)
MCH: 30.6 pg (ref 26.0–34.0)
MCHC: 32.5 g/dL (ref 30.0–36.0)
MCV: 94.3 fL (ref 80.0–100.0)
Platelets: 338 10*3/uL (ref 150–400)
RBC: 2.97 MIL/uL — ABNORMAL LOW (ref 4.22–5.81)
RDW: 14 % (ref 11.5–15.5)
WBC: 12.4 10*3/uL — ABNORMAL HIGH (ref 4.0–10.5)
nRBC: 0 % (ref 0.0–0.2)

## 2020-11-04 LAB — HEPATITIS B E ANTIBODY: Hep B E Ab: NEGATIVE

## 2020-11-04 LAB — BASIC METABOLIC PANEL
Anion gap: 12 (ref 5–15)
BUN: 71 mg/dL — ABNORMAL HIGH (ref 6–20)
CO2: 22 mmol/L (ref 22–32)
Calcium: 8 mg/dL — ABNORMAL LOW (ref 8.9–10.3)
Chloride: 107 mmol/L (ref 98–111)
Creatinine, Ser: 7.16 mg/dL — ABNORMAL HIGH (ref 0.61–1.24)
GFR, Estimated: 9 mL/min — ABNORMAL LOW (ref >60)
Glucose, Bld: 154 mg/dL — ABNORMAL HIGH (ref 70–99)
Potassium: 3.9 mmol/L (ref 3.5–5.1)
Sodium: 141 mmol/L (ref 135–145)

## 2020-11-04 LAB — MAGNESIUM: Magnesium: 2 mg/dL (ref 1.7–2.4)

## 2020-11-04 LAB — PHOSPHORUS: Phosphorus: 5.9 mg/dL — ABNORMAL HIGH (ref 2.5–4.6)

## 2020-11-04 LAB — PROCALCITONIN: Procalcitonin: 1.29 ng/mL

## 2020-11-04 MED ORDER — HEPARIN SODIUM (PORCINE) 1000 UNIT/ML DIALYSIS
1000.0000 [IU] | INTRAMUSCULAR | Status: DC | PRN
  Administered 2020-11-04: 2400 [IU] via INTRAVENOUS_CENTRAL
  Filled 2020-11-04 (×4): qty 1

## 2020-11-04 MED ORDER — SODIUM CHLORIDE 0.9% FLUSH
10.0000 mL | Freq: Two times a day (BID) | INTRAVENOUS | Status: DC
  Administered 2020-11-04 – 2020-11-23 (×26): 10 mL

## 2020-11-04 MED ORDER — LIDOCAINE HCL (PF) 1 % IJ SOLN: 5.0000 mL | INTRAMUSCULAR | Status: DC | PRN

## 2020-11-04 MED ORDER — SODIUM CHLORIDE 0.9 % IV SOLN: 100.0000 mL | INTRAVENOUS | Status: DC | PRN

## 2020-11-04 MED ORDER — DEXMEDETOMIDINE HCL IN NACL 400 MCG/100ML IV SOLN
0.0000 ug/kg/h | INTRAVENOUS | Status: AC
  Administered 2020-11-04 – 2020-11-06 (×18): 1.2 ug/kg/h via INTRAVENOUS
  Filled 2020-11-04: qty 200
  Filled 2020-11-04 (×2): qty 100
  Filled 2020-11-04: qty 200
  Filled 2020-11-04: qty 100
  Filled 2020-11-04: qty 200
  Filled 2020-11-04 (×5): qty 100
  Filled 2020-11-04: qty 200
  Filled 2020-11-04: qty 100

## 2020-11-04 MED ORDER — SODIUM CHLORIDE 0.9% FLUSH: 10.0000 mL | INTRAVENOUS | Status: DC | PRN

## 2020-11-04 MED ORDER — PENTAFLUOROPROP-TETRAFLUOROETH EX AERO: 1.0000 "application " | INHALATION_SPRAY | CUTANEOUS | Status: DC | PRN

## 2020-11-04 MED ORDER — LIDOCAINE-PRILOCAINE 2.5-2.5 % EX CREA
1.0000 "application " | TOPICAL_CREAM | CUTANEOUS | Status: DC | PRN
  Filled 2020-11-04: qty 5

## 2020-11-04 MED ORDER — CARVEDILOL 3.125 MG PO TABS: 3.1250 mg | ORAL_TABLET | Freq: Two times a day (BID) | ORAL | Status: DC

## 2020-11-04 MED ORDER — CHLORHEXIDINE GLUCONATE CLOTH 2 % EX PADS
6.0000 | MEDICATED_PAD | Freq: Every day | CUTANEOUS | Status: DC
  Administered 2020-11-06 – 2020-11-22 (×13): 6 via TOPICAL

## 2020-11-04 MED ORDER — CARVEDILOL 3.125 MG PO TABS
3.1250 mg | ORAL_TABLET | Freq: Two times a day (BID) | ORAL | Status: DC
  Administered 2020-11-04 – 2020-11-05 (×3): 3.125 mg
  Filled 2020-11-04 (×3): qty 1

## 2020-11-04 MED ORDER — ALTEPLASE 2 MG IJ SOLR
2.0000 mg | Freq: Once | INTRAMUSCULAR | Status: DC | PRN
  Filled 2020-11-04: qty 2

## 2020-11-04 NOTE — Progress Notes (Addendum)
Pt increasingly becoming more agitated, paranoid and combative (hitting bed rails, trashing head). Pt wide eyed looking around the room and looks terrified. Attempted to reassure pt where he is and why he is in the hospital but pt continues to be very agitated. He shakes his head "no" and looks like he is trying to yell when staff try to reorient him. PRN dose of Fentanyl give with little to no effect. SBP increased to 190s and HR up to 150s.  While in room this RN pressed the [facility] button for assistance in managing pts agitation. Reported off that day shift had to give multiple PRN doses of Fentanyl and pt has received 4 PRN doses already tonight. New orders received.

## 2020-11-04 NOTE — Progress Notes (Addendum)
NAME:  Andrew [name], MRN:  [id number], DOB:  [birth year], LOS: 8 ADMISSION DATE:  10/26/2020,   Brief History:  40 y/o male that presented to the ER with chest pain & acute hyperactive delirium requiring mechanical ventilation.  Subsequently found to be in rhabdo with AKI   Past Medical History:  PTSD Depression   Significant Hospital Events:  3/4 extubated, AKI worse, found to be in rhabdo 3/5 Continues to have diffuse muscle pain, renal consult 3/6 increased body pain particularly right shoulder, concern for septic arthritis, MRI neg 3/6 reintubated  Consults:  Renal Psych Neuro  Procedures:  ETT 3/3  >> 3/4         3/6 >>  Significant Diagnostic Tests:  Head CT >> sphenoid sinusitis Echo 3/3 >> nml LVEF, no WMA MRI right shoulder 3/6>> no evidence of septic joint, no effusion, severe edema and muscles around shoulder joint consistent with rhabdo EEG 3/3 >> moderate diffuse encephalopathy, no seizures or epileptiform discharges  Head CT 3/6 >> neg Head MRI 3/7 >> neg CXR 3/11 >> possible emerging pulmonary edema  Micro Data:  COVID (-) Influenza (-) resp 3/3 >> GPC, GNR >>nml flora Blood 3/3 >> ng Blood 3/6 >> ng x1 day CSF gram stain >> no organisms  Antimicrobials:  ceftx 3/3 >> 3/6 azithro 3/3 >> Cefazolin 3/6 >> vanc 3/6  X 1 dose Ceftriaxone (meningitic dosing) 3/7>>3/9    SUBJ  Significantly improved level of alertness and strength as compared to yesterday. Answered yes/no questions. Pt appeared to understand what was said and became tearful at times. Improved strength. Profuse watery diarrhea overnight. Precedex stopped during HD yesterday requiring Fentanyl increase to 400 overnight for agitation.  PRVC overnight, plan to switch to PSV this morning. Down ~2.5L from UOP and HD  Net +19.3 L Afebrile   Objective   Blood pressure (!) 151/91, pulse 75, temperature 98.3 F (36.8 C), temperature source Oral, resp. rate 18, height 5\' 10"  (1.778  m), weight 114.6 kg, SpO2 100 %.    Vent Mode: PRVC FiO2 (%):  [40 %] 40 % Set Rate:  [18 bmp] 18 bmp Vt Set:  [500 mL] 500 mL PEEP:  [5 cmH20] 5 cmH20 Plateau Pressure:  [29 cmH20-32 cmH20] 29 cmH20   Intake/Output Summary (Last 24 hours) at 11/04/2020 0831 Last data filed at 11/04/2020 0800 Gross per 24 hour  Intake 3636.24 ml  Output 3405 ml  Net 231.24 ml   Filed Weights   11/03/20 1130 11/03/20 1330 11/04/20 0500  Weight: 123 kg 122 kg 114.6 kg    Examination: General: NAD, eyes open looking around room, not-chronically ill appearing, intubated, sedated on fentanyl and precedex HENT: No pallor, no icterus, pupils equal, MMM Lungs: Bilateral ventilated breath sounds, no accessory muscle use Cardiovascular: RRR, normal S1S2, no MRG Abdomen: soft, mildly distended - improved from yesterday, less tense on palpation Extremities: Edematous in all extremities and abdomen, no cyanosis/ clubbing, no tremor noted today Neuro: eyes open, responding to voice on 400 mcg fentanyl, able to answer yes/no questions, follows commands - lifts arms off bed and attempts to lift legs without fully raising them, weak squeeze in both hands   Resolved problems   Assessment & Plan:   Presented with agitated delirium and rhabdo, suspect Wellbutrin and Seroquel as culprits but surreptitious drug use being in the differential although UDS negative x2 -Initially improved and extubated but then intubated for airway protection due to worsening somnolence on 3/6. Improved mentation and strength  s/p HD on 3/10.  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 -neuro consulted -Uremic  encephalopathy,neuropathy vs GBS vs aseptic meningitis -likely some component of rhabdo contributing to muscle weakness -possible intoxication/withdrawal given history of pain medication use and acute decompensation 3/5 after 2 days of lucidity -significant improvement in strength and mental status after HD supporting uremic encephalopathy, neuropathy - BUN 73 -no acute etiology found on MRI, CT, EEG, or infectious work-up -hold Wellbutrin and Seroquel -wean precedex and fentanyl as able to -continue neuro checks, q4h delirium screening  Severe AKI , rhabdomyolysis AG Metabolic Acidosis 2/2 Acute Renal Failure, Resolved -Nephrology following -d/c IVF, KVO -BUN 73, no longer acidotic s/p HD -net+ 19.3 L -HD again today, f/u volume status -Fentanyl for severe pain (prefers fentanyl to Dilaudid) -strict I&O, daily BMET, mag, and phos  Diarrhea New onset profuse watery diarrhea overnight with mild elevation in WBC -hold miralax -docusate PRN -f/u fecal lactoferrin -follow WBC, fever curve  Hypertension No PMHx of HTN on chart review, no home antihypertensive medications -no real improvement in BP since increasing metop -d/c metoprolol tartrate -cont Amlodipine 10 mg QD -start carvedilol 3.125 mg BID -hydralazine for SBP>170  Aspiration pneumonitis/sphenoid sinusitis -afebrile since 3/6 -likely evolving pulmonary edema on CXR -mild leukocytosis this AM to 12.4 with new onset diarrhea -s/p 7 days abx -f/u procalcitonin -follow fever curve, WBC  Bloody/coffee-ground NG aspirate -no further emesis -Hgb stable -Protonix QD  Best practice (evaluated daily)  Diet: NPO Pain/Anxiety/Delirium protocol (if indicated):Fent gtt + precedex VAP protocol (if indicated): yes DVT prophylaxis: lovenox GI prophylaxis: protonix Glucose control: Monitor blood sugar Mobility: bedrest Disposition:ICU  Goals of Care:   Code Status: Full  Labs   CBC: Recent Labs  Lab 10/30/20 0159  10/30/20 2104 10/31/20 0530 11/02/20 0129 11/03/20 0143 11/04/20 0208  WBC 10.3  --  10.9* 9.5 11.1* 12.4*  HGB 9.6* 11.2* 10.3* 10.0* 9.5* 9.1*  HCT 27.0* 33.0* 30.0* 29.1* 29.3* 28.0*  MCV 87.1  --  88.5 90.7 93.0 94.3  PLT 251  --  311 341 238 338    Basic Metabolic Panel: Recent Labs  Lab 10/31/20 0530 11/01/20 0152 11/01/20 0803 11/01/20 1106 11/02/20 0129 11/03/20 0143 11/04/20 0208  NA 130*  --  132* 135 135 141 141  K 3.3*  --  4.0 4.0 4.2 4.3 3.9  CL 86*  --  94* 97* 101 105 107  CO2 22  --  17* 20* 17* 20* 22  GLUCOSE 86  --  108* 117* 132* 132* 154*  BUN 72*  --  81* 83* 90* 93* 71*  CREATININE 11.69*  --  11.94* 11.51* 11.14* 10.29* 7.16*  CALCIUM 6.2*  --  6.7* 6.9* 7.0* 7.7* 8.0*  MG 2.2 2.2  --   --  2.2 2.1 2.0  PHOS 7.0* 7.0*  --   --  6.8* 6.9* 5.9*   GFR: Estimated Creatinine Clearance: 17 mL/min (A) (by C-G formula based on SCr of 7.16 mg/dL (H)). Recent Labs  Lab 10/31/20 0530 10/31/20 0949 11/01/20 0152 11/02/20 0129 11/03/20 0143 11/04/20 0208  PROCALCITON  --  3.94 3.92 3.65  --   --   WBC 10.9*  --   --  9.5 11.1* 12.4*  LATICACIDVEN  --   --   --  1.3  --   --     Liver Function Tests: Recent Labs  Lab 10/31/20 0530  ALBUMIN 2.8*   No results for input(s): LIPASE, AMYLASE in the last 168 hours. Recent Labs  Lab 10/31/20 0949  AMMONIA 37*    ABG    Component Value Date/Time   PHART 7.414 11/02/2020 0323   PCO2ART 30.1 (L) 11/02/2020 0323   PO2ART 157 (H) 11/02/2020 0323   HCO3 19.0 (L) 11/02/2020 0323   TCO2 36 (H) 10/30/2020 2104   ACIDBASEDEF 4.8 (H) 11/02/2020 0323   O2SAT 99.1 11/02/2020 0323     Coagulation Profile: No results for input(s): INR, PROTIME in the last 168 hours.  Cardiac Enzymes: Recent Labs  Lab 10/29/20 1047 10/30/20 0159 11/02/20 0129 11/03/20 1231  CKTOTAL 41,656* 28,110* 6,322* 3,027*    HbA1C: No results found for: HGBA1C  CBG: Recent Labs  Lab 11/03/20 1550 11/03/20 1938  11/03/20 2317 11/04/20 0328 11/04/20 0751  GLUCAP 141* 136* 117* 126* 131*    Jori Moll, Medical Student 11/04/2020, 8:31 AM

## 2020-11-04 NOTE — Progress Notes (Signed)
Neurology Progress Note  Reason for Consult:New onset hypoactive delirum Requesting Physician:Rakesh Vassie Loll, MD  Subjective: Indicates that he wishes to communicate with writing.  Writes down that he thinks he is dead.  Exam: Vitals:   11/30/2020 0600 2020-11-30 0700  BP: (!) 170/106 (!) 151/91  Pulse: (!) 111 75  Resp: (!) 22 18  Temp:    SpO2: 100% 100%   Gen: In bed, quite anxious/agitated Resp: Overall appears comfortable on the ventilator Abd: more distention appears to be improving Extremities: Edema is improving Eyes: No scleral injections HENT: Remains intubated CV: RRR Skin: Warm and dry Psych: Endorses that he truly does believe that he is dead and cannot be convinced otherwise  Neurological exam Awake and alert.  Can communicate clearly via writing but limited by delirium/psychosis II: Visual Fields are grossly full. Pupils are equal, round, and reactive to light.  III,IV, VI: EOMI grossly intact.  V:Equal response to touch bilaterally VII: Facial movement is symmetricin the upper face, lower face limited secondary to ET tube.  VIII: hearing is intact to voice XI/X:Cough reflex is intact, gag not assessed today  Reflexes: Dec 01, 2022 patient refused reflex testing and given his significant psychological distress this was deferred On 3/10 absent in bilateral patellas, Achilles. 1+ brachioradialis and triceps Notably, reflexes could be elicited on 3/7  Motor: Tone is normal. Bulk isdifficult to assess due toto edema but likely normal. Despite his psychological distress, patient is able to give me at least 4/5 strength throughout the upper extremities, and does wiggle bilateral toes today.  Sensory: Appears grossly equally reactive to stimulation in all 4 extremities  Pertinent Labs: CBC Latest Ref Rng & Units 11-30-20 11/03/2020 11/02/2020  WBC 4.0 - 10.5 K/uL 12.4(H) 11.1(H) 9.5  Hemoglobin 13.0 - 17.0 g/dL 7.1(I) 9.6(V) 10.0(L)  Hematocrit 39.0 - 52.0 %  28.0(L) 29.3(L) 29.1(L)  Platelets 150 - 400 K/uL 338 238 341   CMP Latest Ref Rng & Units 11-30-20 11/03/2020 11/02/2020  Glucose 70 - 99 mg/dL 893(Y) 101(B) 510(C)  BUN 6 - 20 mg/dL 58(N) 27(P) 82(U)  Creatinine 0.61 - 1.24 mg/dL 2.35(T) 61.44(R) 15.40(G)  Sodium 135 - 145 mmol/L 141 141 135  Potassium 3.5 - 5.1 mmol/L 3.9 4.3 4.2  Chloride 98 - 111 mmol/L 107 105 101  CO2 22 - 32 mmol/L 22 20(L) 17(L)  Calcium 8.9 - 10.3 mg/dL 8.0(L) 7.7(L) 7.0(L)  Total Protein 6.5 - 8.1 g/dL - - -  Total Bilirubin 0.3 - 1.2 mg/dL - - -  Alkaline Phos 38 - 126 U/L - - -  AST 15 - 41 U/L - - -  ALT 0 - 44 U/L - - -    Impression:  Mr. Peterka is a 40 yo patient with a hx of MDD and PTSD.   He does appear to be improving/stabilizing with dialysis and hopefully with continued renal recovery.  Recommendations: - Continue B12 supplementation - Delirium precautions: consistent reorientation, quiet sleeping atmosphere, minimizing excess noise and restraints - Given he is communicating psychotic thoughts via writing, may be appropriate to [facility] psychiatry at this time - Appreciate nephrology management of hemodialysis - Appreciate excellent care of the patient's other comorbidities and ventilator by CCM team -Neurology will continue to follow  Attending Neurologist's note:  I personally saw this patient, gathering history, performing a full neurologic examination, reviewing relevant labs, and formulated the assessment and plan, adding the note above for completeness and clarity to accurately reflect my thoughts   CRITICAL CARE Performed by: Judie Bonus  L Eino Whitner  Total critical care time: 44 minutes  Critical care time was exclusive of separately billable procedures and treating other patients.  Critical care was necessary to treat or prevent imminent or life-threatening deterioration.  Critical care was time spent personally by me on the following activities: development of treatment plan with  patient and/or surrogate as well as nursing, discussions with consultants, evaluation of patient's response to treatment, examination of patient, obtaining history from patient or surrogate, ordering and performing treatments and interventions, ordering and review of laboratory studies, ordering and review of radiographic studies, pulse oximetry and re-evaluation of patient's condition.

## 2020-11-04 NOTE — Progress Notes (Signed)
RT note. Pt. Switched to [facility] at this time to rest for the night and ^ PS 12. Pt. Currently tolerating okay at this time. RT will change back if needed, RN made aware, RT will continue to monitor.

## 2020-11-04 NOTE — Progress Notes (Signed)
Patient ID: Andrew [name], male   DOB: March 18, 1981, 40 y.o.   MRN: [id number] S: Patient tolerated dialysis yesterday without significant problems.  Diarrhea overnight.  Made 1.9 L in urine.  Sedated and does not answer questions O:BP (!) 151/91   Pulse 75   Temp 98.3 F (36.8 C) (Oral)   Resp 18   Ht 5' 10"  (1.778 m)   Wt 114.6 kg   SpO2 100%   BMI 36.25 kg/m   Intake/Output Summary (Last 24 hours) at 11/04/2020 1035 Last data filed at 11/04/2020 0800 Gross per 24 hour  Intake 3364.37 ml  Output 3130 ml  Net 234.37 ml   Intake/Output: I/O last 3 completed shifts: In: 4559.8 [I.V.:3108.8; NG/GT:1351; IV Piggyback:100] Out: 7494 [Urine:2580; Emesis/NG output:275; Other:1000; Stool:400]  Intake/Output this shift:  Total I/O In: 333 [I.V.:213; NG/GT:120] Out: 150 [Urine:150] Weight change: 0 kg Gen: In bed, no distress CVS: Normal rate Resp: Bilateral chest rise, ventilated, coarse breath sounds Abd: Mild distention, +BS, NT Ext: 1+ pitting edema in the bilateral lower extremities, warm and well perfused  Recent Labs  Lab 10/29/20 1047 10/30/20 0159 10/30/20 2104 10/31/20 0530 11/01/20 0152 11/01/20 0803 11/01/20 1106 11/02/20 0129 11/03/20 0143 11/04/20 0208  NA 131* 129* 129* 130*  --  132* 135 135 141 141  K 3.0* 3.2* 3.6 3.3*  --  4.0 4.0 4.2 4.3 3.9  CL 90* 83*  --  86*  --  94* 97* 101 105 107  CO2 23 27  --  22  --  17* 20* 17* 20* 22  GLUCOSE 124* 110*  --  86  --  108* 117* 132* 132* 154*  BUN 61* 64*  --  72*  --  81* 83* 90* 93* 71*  CREATININE 8.88* 10.16*  --  11.69*  --  11.94* 11.51* 11.14* 10.29* 7.16*  ALBUMIN  --   --   --  2.8*  --   --   --   --   --   --   CALCIUM 7.0* 6.5*  --  6.2*  --  6.7* 6.9* 7.0* 7.7* 8.0*  PHOS 6.0* 7.3*  --  7.0* 7.0*  --   --  6.8* 6.9* 5.9*   Liver Function Tests: Recent Labs  Lab 10/31/20 0530  ALBUMIN 2.8*   No results for input(s): LIPASE, AMYLASE in the last 168 hours. Recent Labs  Lab 10/31/20 0949   AMMONIA 37*   CBC: Recent Labs  Lab 10/30/20 0159 10/30/20 2104 10/31/20 0530 11/02/20 0129 11/03/20 0143 11/04/20 0208  WBC 10.3  --  10.9* 9.5 11.1* 12.4*  HGB 9.6*   < > 10.3* 10.0* 9.5* 9.1*  HCT 27.0*   < > 30.0* 29.1* 29.3* 28.0*  MCV 87.1  --  88.5 90.7 93.0 94.3  PLT 251  --  311 341 238 338   < > = values in this interval not displayed.   Cardiac Enzymes: Recent Labs  Lab 10/29/20 1047 10/30/20 0159 11/02/20 0129 11/03/20 1231  CKTOTAL 41,656* 28,110* 6,322* 3,027*   CBG: Recent Labs  Lab 11/03/20 1550 11/03/20 1938 11/03/20 2317 11/04/20 0328 11/04/20 0751  GLUCAP 141* 136* 117* 126* 131*    Iron Studies: No results for input(s): IRON, TIBC, TRANSFERRIN, FERRITIN in the last 72 hours. Studies/Results: DG Abd 1 View  Result Date: 11/03/2020 CLINICAL DATA:  Abdominal distension and diarrhea EXAM: ABDOMEN - 1 VIEW COMPARISON:  October 27, 2020 FINDINGS: Nasogastric tube tip and side port are in  the stomach. There are loops of dilated bowel without appreciable air-fluid levels. No free air. Visualized lung bases clear. IMPRESSION: Nasogastric tube tip and side port in stomach. Bowel dilatation without air-fluid levels. Suspect a degree of ileus or enteritis. Bowel obstruction less likely. No free air evident. Electronically Signed   By: Lowella Grip III M.D.   On: 11/03/2020 15:42   DG Chest Port 1 View  Result Date: 11/04/2020 CLINICAL DATA:  Hypoxia EXAM: PORTABLE CHEST 1 VIEW COMPARISON:  November 02, 2020 FINDINGS: Endotracheal tube tip is 2.8 cm above the carina. Nasogastric tube tip and side port are below the diaphragm. Central catheter tip is in the superior vena cava. No pneumothorax. There is no edema or airspace opacity. Heart is upper normal in size with pulmonary vascularity normal. No adenopathy. No bone lesions. IMPRESSION: Tube and catheter positions as described without pneumothorax. No edema or airspace opacity. Stable cardiac prominence.  Electronically Signed   By: Lowella Grip III M.D.   On: 11/04/2020 07:54   DG CHEST PORT 1 VIEW  Result Date: 11/02/2020 CLINICAL DATA:  Status post central line placement. EXAM: PORTABLE CHEST 1 VIEW COMPARISON:  Single-view of the chest earlier today. FINDINGS: New right IJ approach central venous catheter tip projects in the mid to upper superior vena cava. No pneumothorax. Endotracheal tube and NG tube are unchanged. Lungs are clear. IMPRESSION: Right IJ catheter tip projects in the mid to upper superior vena cava. No pneumothorax. ETT and NG tube are unchanged. Lungs are clear. Electronically Signed   By: Inge Rise M.D.   On: 11/02/2020 13:14   . amLODipine  10 mg Per Tube Daily  . carvedilol  3.125 mg Per Tube BID WC  . chlorhexidine gluconate (MEDLINE KIT)  15 mL Mouth Rinse BID  . Chlorhexidine Gluconate Cloth  6 each Topical Q0600  . [START ON 11/05/2020] Chlorhexidine Gluconate Cloth  6 each Topical Q0600  . feeding supplement (PROSource TF)  45 mL Per Tube QID  . heparin injection (subcutaneous)  5,000 Units Subcutaneous Q8H  . mouth rinse  15 mL Mouth Rinse 10 times per day  . multivitamin  15 mL Per Tube Daily  . pantoprazole sodium  40 mg Per Tube Daily  . sodium chloride flush  10-40 mL Intracatheter Q12H  . vitamin B-12  1,000 mcg Per Tube Daily    BMET    Component Value Date/Time   NA 141 11/04/2020 0208   K 3.9 11/04/2020 0208   CL 107 11/04/2020 0208   CO2 22 11/04/2020 0208   GLUCOSE 154 (H) 11/04/2020 0208   BUN 71 (H) 11/04/2020 0208   CREATININE 7.16 (H) 11/04/2020 0208   CALCIUM 8.0 (L) 11/04/2020 0208   GFRNONAA 9 (L) 11/04/2020 0208   CBC    Component Value Date/Time   WBC 12.4 (H) 11/04/2020 0208   RBC 2.97 (L) 11/04/2020 0208   HGB 9.1 (L) 11/04/2020 0208   HCT 28.0 (L) 11/04/2020 0208   PLT 338 11/04/2020 0208   MCV 94.3 11/04/2020 0208   MCH 30.6 11/04/2020 0208   MCHC 32.5 11/04/2020 0208   RDW 14.0 11/04/2020 0208     Assessment/Plan: 1.  Severe AKI secondary to rhabdomyolysis: Creatinine improving and urine output adequate.  Status post dialysis yesterday due to concern for uremic neuropathy.  He is making a lot of urine but due to this concern we will plan on another session of dialysis today.  Hopefully at that point we can avoid further  dialysis given he is likely recovering his kidney function 1. renal US without obstruction.   2. GN serologies negative 3. CK levels declining 4. Repeat dialysis today and hopefully hold off thereafter 2. Weakness and areflexia: Appears to be ascending according to neurology.  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  We are planning to do dialysis as above.  Other concern could be GBS.  If the patient fails to improve with dialysis PLEX with IVIG may need to be considered; appreciate help from neurology 3. Acute hyperactive delirium -likely multifactorial.  Toxic metabolic encephalopathy possibly contributing to some extent.  Neurology following, appreciate help 4. Acute rhabdomyolysis -CK downtrending.   5. Hypocalcemia: Likely secondary to rhabdomyolysis.  Significantly improved 6. Hyperphosphatemia: Secondary to rhabdomyolysis.  Improving 7. Aspiration pneumonitis/sphenoid sinusitis -antibiotics per primary team 8. Hypertension: likely will improve with improving UOP. CTM

## 2020-11-04 NOTE — Progress Notes (Signed)
NAME:  ALEXYS GASSETT, MRN:  [id number], DOB:  1981/05/21, LOS: 8 ADMISSION DATE:  10/26/2020,   Brief History:  40 y/o male that presented to the ER with chest pain & acute hyperactive delirium requiring mechanical ventilation.  Subsequently found to be in rhabdo with AKI  Past Medical History:  PTSD Depression  Significant Hospital Events:  3/4 extubated, AKI worse, found to be in rhabdo 3/5 Continues to have diffuse muscle pain, renal consult 3/6 increased body pain particularly right shoulder, concern for septic arthritis, MRI neg  Consults:  Renal Psych Neuro  Procedures:  ETT 3/3  >> 3/4         3/6 >> HD catheter 3/9  Significant Diagnostic Tests:  Head CT >> sphenoid sinusitis Echo 3/3 >> nml LVEF, no WMA MRI right shoulder 3/6>> no evidence of septic joint, no effusion, severe edema and muscles around shoulder joint consistent with rhabdo  Head CT 3/6 >> neg CSF cell count 3/7 >> WBC 2, RBC 2 (hypocellular) MRI brain 3/7 >> normal, paranasal sinus mucosal thickening, small mastoid effusions  Micro Data:  COVID (-) Influenza (-) resp 3/3 >> GPC, GNR >>nml flora Blood 3/3 >> negative Blood 3/6 >> CSF 3/7 >>   Antimicrobials:  ceftx 3/3 >> 3/6 azithro 3/3 x 1 Cefazolin 3/6 >> 3/7 Ceftriaxone 3/7 >> 3/9 vanc 3/6  X 1 dose vanco 3/7 >> 3/8  Interval events:  HD yesterday, planning for again today. Diarrhea began yesterday. He denies complaints.  Objective   Blood pressure (!) 151/91, pulse 75, temperature 98.3 F (36.8 C), temperature source Oral, resp. rate 18, height 5\' 10"  (1.778 m), weight 114.6 kg, SpO2 100 %.    Vent Mode: PRVC FiO2 (%):  [40 %] 40 % Set Rate:  [18 bmp] 18 bmp Vt Set:  [500 mL] 500 mL PEEP:  [5 cmH20] 5 cmH20 Plateau Pressure:  [29 cmH20-32 cmH20] 29 cmH20   Intake/Output Summary (Last 24 hours) at 11/04/2020 0845 Last data filed at 11/04/2020 0800 Gross per 24 hour  Intake 3636.24 ml  Output 3405 ml  Net 231.24 ml    Filed Weights   11/03/20 1130 11/03/20 1330 11/04/20 0500  Weight: 123 kg 122 kg 114.6 kg    Examination: General: critically ill appearing man laying in bed in NAD, intubated, lightly sedated HENT: /AT, eyes anicteric, ETT in place. Lungs: breathing comfortably on MV, Pplat 31, Ppeak 40. CTAB, minimal ETT secretions, clear. Cardiovascular: RRR, no murmurs. Abdomen: distended, soft, NT, tympanitic in LUQ Extremities: ++ edema, no cyanosis Neuro: RASS 0, nodding to answer questions, following all commands but very weak- barely able to lift head off bed, can lift arms lightly off bed, can wiggle toes and has trouble bending knees. Gu: foley in place, clear yellow urine.  CXR personally reviewed> pulmonary edema, ETT in appropriate position.  Resolved problems:     Assessment & Plan:   Presented with agitated delirium and rhabdo, suspecting Wellbutrin and Seroquel as culprits but surreptitious drug use being in the differential although UDS negative x2.  No evidence occult seizure activity. -Initially improved and extubated but then intubated for airway protection due to worsening somnolence on 3/6  Acute hypoxic respiratory failure requiring MV -reintubated 3/6 -Con't LTVV, 4-8cc/kg IBW with goal Pplat <30 and DP<15 -VAP prevention protocol -daily SAT & SBT as tolerated; requiring too high of PS to extubate currently. Also needs fluid off. -PAD protocol for sedation; wean sedation as able.  Acute encephalopathy with hyperactive delirium;  delirium controlled currently. 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 UDS and head CT negative on admit, he denies history of drug abuse, 3/5 staff notes girlfriend visiting before his deterioration in mental status. She was noted to be unsteady, suspicious for surreptitious drug use, UDS repeated 3/6 only shows benzos and opiates which are prescribed  medicines. MRI brain, CSF reassuring thus far. -Con't dialysis for metabolic clearance and volume removal -Holding Wellbutrin, Seroquel -Currently on fentanyl, Precedex infusions. Titrate off as able.  Xanax available -No evidence for meningitis on CSF, no clear indication for acyclovir, although HSV not checked. -Completing empiric antibiotics for possible infection as below -B12 supplementation  Aspiration pneumonitis without clear pneumonia sphenoid sinusitis -7-day course antibiotics completed on 3/9 -Remains afebrile off antibiotics, although WBC uptrending.  Recheck procalcitonin today to compare to 3/9 level.  Leukocytosis -with diarrhea, concern for infectious diarrhea; checking fecal lactoferrin -recheck PCT -con't to monitor off antibiotics  Hypertension, not on home agents -Continue hydralazine PRN -Continue amlodipine 10 mg once daily -change metoprolol to coreg 3.125mg  BID  AKI , rhabdomyolysis Acute AG metabolic acidosis- resolved Hyperphosphatemia Cause unclear.  Question meds.  No overt seizure activity noted.  Question shoulder injury based on his complaints of pain -Repeat CK 3/9 shows continued improvement, clearance -net ++++ volume status; stop maintenance IVF -Appreciate Nephro's management; con't HD -monitor  Bloody/coffee-ground NG aspirate- resolved -PPI daily; monitor -Continue to follow intermittent CBC -Tolerating prophylactic subcutaneous heparin  Hyperglycemia -con't to monitor; not requiring insulin  Diarrhea -check lactoferrin -deescalate bowel regimen  Anemia due to critical illness -con't to monitor -transfuse for Hb<7 or hemodynamically significant bleeding  Best practice (evaluated daily)  Diet: TF Pain/Anxiety/Delirium protocol (if indicated):Fent gtt + precedex VAP protocol (if indicated): yes DVT prophylaxis: heparin GI prophylaxis: protonix Glucose control: Monitor blood sugar Mobility: bedrest Disposition:ICU Family:  Discussed with patient's father 3/9 and 3/10.   Goals of Care:   Code Status: Full  Labs   CBC: Recent Labs  Lab 10/30/20 0159 10/30/20 2104 10/31/20 0530 11/02/20 0129 11/03/20 0143 11/04/20 0208  WBC 10.3  --  10.9* 9.5 11.1* 12.4*  HGB 9.6* 11.2* 10.3* 10.0* 9.5* 9.1*  HCT 27.0* 33.0* 30.0* 29.1* 29.3* 28.0*  MCV 87.1  --  88.5 90.7 93.0 94.3  PLT 251  --  311 341 238 338    Basic Metabolic Panel: Recent Labs  Lab 10/31/20 0530 11/01/20 0152 11/01/20 0803 11/01/20 1106 11/02/20 0129 11/03/20 0143 11/04/20 0208  NA 130*  --  132* 135 135 141 141  K 3.3*  --  4.0 4.0 4.2 4.3 3.9  CL 86*  --  94* 97* 101 105 107  CO2 22  --  17* 20* 17* 20* 22  GLUCOSE 86  --  108* 117* 132* 132* 154*  BUN 72*  --  81* 83* 90* 93* 71*  CREATININE 11.69*  --  11.94* 11.51* 11.14* 10.29* 7.16*  CALCIUM 6.2*  --  6.7* 6.9* 7.0* 7.7* 8.0*  MG 2.2 2.2  --   --  2.2 2.1 2.0  PHOS 7.0* 7.0*  --   --  6.8* 6.9* 5.9*   GFR: Estimated Creatinine Clearance: 17 mL/min (A) (by C-G formula based on SCr of 7.16 mg/dL (H)). Recent Labs  Lab 10/31/20 0530 10/31/20 0949 11/01/20 0152 11/02/20 0129 11/03/20 0143 11/04/20 0208  PROCALCITON  --  3.94 3.92 3.65  --   --   WBC 10.9*  --   --  9.5 11.1* 12.4*  LATICACIDVEN  --   --   --  1.3  --   --     Liver Function Tests: Recent Labs  Lab 10/31/20 0530  ALBUMIN 2.8*   No results for input(s): LIPASE, AMYLASE in the last 168 hours. Recent Labs  Lab 10/31/20 0949  AMMONIA 37*    ABG    Component Value Date/Time   PHART 7.414 11/02/2020 0323   PCO2ART 30.1 (L) 11/02/2020 0323   PO2ART 157 (H) 11/02/2020 0323   HCO3 19.0 (L) 11/02/2020 0323   TCO2 36 (H) 10/30/2020 2104   ACIDBASEDEF 4.8 (H) 11/02/2020 0323   O2SAT 99.1 11/02/2020 0323     Coagulation Profile: No results for input(s): INR, PROTIME in the last 168 hours.  Cardiac Enzymes: Recent Labs  Lab 10/29/20 1047 10/30/20 0159 11/02/20 0129 11/03/20 1231   CKTOTAL 41,656* 28,110* 6,322* 3,027*    HbA1C: No results found for: HGBA1C  CBG: Recent Labs  Lab 11/03/20 1550 11/03/20 1938 11/03/20 2317 11/04/20 0328 11/04/20 0751  GLUCAP 141* 136* 117* 126* 131*    This patient is critically ill with multiple organ system failure which requires frequent high complexity decision making, assessment, support, evaluation, and titration of therapies. This was completed through the application of advanced monitoring technologies and extensive interpretation of multiple databases. During this encounter critical care time was devoted to patient care services described in this note for 46 minutes.  Steffanie Dunn, DO 11/04/20 8:47 AM [facility]  From 7AM- 7PM if no response to pager, please call [phone]. After hours, 7PM- 7AM, please call Elink  [phone].

## 2020-11-04 NOTE — Progress Notes (Signed)
RT note. Pt. Flipped back to PS/CPAP due to agitation, 12/5 40%. RT will continue to monitor, RN/elink  aware

## 2020-11-04 NOTE — Progress Notes (Signed)
eLink Physician-Brief Progress Note Patient Name: Andrew [name] DOB: [birth year] MRN: [id number]   Date of Service  11/04/2020  HPI/Events of Note  Frequent liquid stools - Nursing request for Flexiseal.   eICU Interventions  Will place Flexiseal.      Intervention Category Major Interventions: Other:  Lenell Antu 11/04/2020, 6:13 AM

## 2020-11-04 NOTE — Progress Notes (Signed)
eLink Physician-Brief Progress Note Patient Name: Andrew [name] DOB: Jun 27, 1981 MRN: [id number]   Date of Service  11/04/2020  HPI/Events of Note  Patient needs wrist restraints renewed to prevent self-extubation.  eICU Interventions  Restraints renewed.        Andrew [name] 11/04/2020, 10:17 PM

## 2020-11-05 ENCOUNTER — Inpatient Hospital Stay (HOSPITAL_COMMUNITY): Payer: Medicaid Other

## 2020-11-05 LAB — GLUCOSE, CAPILLARY
Glucose-Capillary: 135 mg/dL — ABNORMAL HIGH (ref 70–99)
Glucose-Capillary: 129 mg/dL — ABNORMAL HIGH (ref 70–99)
Glucose-Capillary: 114 mg/dL — ABNORMAL HIGH (ref 70–99)
Glucose-Capillary: 112 mg/dL — ABNORMAL HIGH (ref 70–99)
Glucose-Capillary: 110 mg/dL — ABNORMAL HIGH (ref 70–99)
Glucose-Capillary: 109 mg/dL — ABNORMAL HIGH (ref 70–99)

## 2020-11-05 LAB — CBC
HCT: 27 % — ABNORMAL LOW (ref 39.0–52.0)
Hemoglobin: 8.5 g/dL — ABNORMAL LOW (ref 13.0–17.0)
MCH: 30.5 pg (ref 26.0–34.0)
MCHC: 31.5 g/dL (ref 30.0–36.0)
MCV: 96.8 fL (ref 80.0–100.0)
Platelets: 306 10*3/uL (ref 150–400)
RBC: 2.79 MIL/uL — ABNORMAL LOW (ref 4.22–5.81)
RDW: 13.9 % (ref 11.5–15.5)
WBC: 13.2 10*3/uL — ABNORMAL HIGH (ref 4.0–10.5)
nRBC: 0 % (ref 0.0–0.2)

## 2020-11-05 LAB — BASIC METABOLIC PANEL
Anion gap: 8 (ref 5–15)
BUN: 49 mg/dL — ABNORMAL HIGH (ref 6–20)
CO2: 26 mmol/L (ref 22–32)
Calcium: 8.3 mg/dL — ABNORMAL LOW (ref 8.9–10.3)
Chloride: 107 mmol/L (ref 98–111)
Creatinine, Ser: 4.53 mg/dL — ABNORMAL HIGH (ref 0.61–1.24)
GFR, Estimated: 16 mL/min — ABNORMAL LOW (ref >60)
Glucose, Bld: 146 mg/dL — ABNORMAL HIGH (ref 70–99)
Potassium: 4 mmol/L (ref 3.5–5.1)
Sodium: 141 mmol/L (ref 135–145)

## 2020-11-05 LAB — METHYLMALONIC ACID, SERUM: Methylmalonic Acid, Quantitative: 355 nmol/L (ref 0–378)

## 2020-11-05 LAB — LACTOFERRIN, FECAL, QUALITATIVE: Lactoferrin, Fecal, Qual: NEGATIVE

## 2020-11-05 LAB — CK: Total CK: 1481 U/L — ABNORMAL HIGH (ref 49–397)

## 2020-11-05 LAB — PROCALCITONIN: Procalcitonin: 0.72 ng/mL

## 2020-11-05 MED ORDER — HALOPERIDOL LACTATE 5 MG/ML IJ SOLN
1.0000 mg | INTRAMUSCULAR | Status: DC | PRN
  Administered 2020-11-05: 2 mg via INTRAVENOUS

## 2020-11-05 MED ORDER — HALOPERIDOL LACTATE 5 MG/ML IJ SOLN
1.0000 mg | INTRAMUSCULAR | Status: DC | PRN
  Filled 2020-11-05: qty 1

## 2020-11-05 MED ORDER — CARVEDILOL 12.5 MG PO TABS
6.2500 mg | ORAL_TABLET | Freq: Two times a day (BID) | ORAL | Status: DC
  Administered 2020-11-05 – 2020-11-06 (×2): 6.25 mg
  Filled 2020-11-05 (×2): qty 1

## 2020-11-05 MED ORDER — CARVEDILOL 3.125 MG PO TABS
3.1250 mg | ORAL_TABLET | Freq: Once | ORAL | Status: AC
  Administered 2020-11-05: 3.125 mg via ORAL
  Filled 2020-11-05: qty 1

## 2020-11-05 MED ORDER — HALOPERIDOL LACTATE 5 MG/ML IJ SOLN: 1.0000 mg | INTRAMUSCULAR | Status: DC | PRN

## 2020-11-05 NOTE — Progress Notes (Signed)
elink assisted virtual visit w/ family 

## 2020-11-05 NOTE — Progress Notes (Signed)
NAME:  Andrew [name], MRN:  [id number], DOB:  01/29/1981, LOS: 9 ADMISSION DATE:  10/26/2020,   Brief History:  40 y/o male that presented to the ER with chest pain & acute hyperactive delirium requiring mechanical ventilation.  Subsequently found to be in rhabdo with AKI  Past Medical History:  PTSD Depression  Significant Hospital Events:  3/4 extubated, AKI worse, found to be in rhabdo 3/5 Continues to have diffuse muscle pain, renal consult 3/6 increased body pain particularly right shoulder, concern for septic arthritis, MRI neg  Consults:  Renal Psych Neuro  Procedures:  ETT 3/3  >> 3/4         3/6 >> HD catheter 3/9  Significant Diagnostic Tests:  Head CT >> sphenoid sinusitis Echo 3/3 >> nml LVEF, no WMA MRI right shoulder 3/6>> no evidence of septic joint, no effusion, severe edema and muscles around shoulder joint consistent with rhabdo  Head CT 3/6 >> neg CSF cell count 3/7 >> WBC 2, RBC 2 (hypocellular) MRI brain 3/7 >> normal, paranasal sinus mucosal thickening, small mastoid effusions  Micro Data:  COVID (-) Influenza (-) resp 3/3 >> GPC, GNR >>nml flora Blood 3/3 >> negative Blood 3/6 >>NG CSF 3/7 >> Fecal lactoferrin pending   Antimicrobials:  ceftx 3/3 >> 3/6 azithro 3/3 x 1 Cefazolin 3/6 >> 3/7 Ceftriaxone 3/7 >> 3/9 vanc 3/6  X 1 dose vanco 3/7 >> 3/8  Interval events:  Yesterday told Neurology that he felt like he was dead and had appearance of being acute psychotic during their exam. Agitated overnight, required high dose sedation again, but he was very strong and pulling against his restraints last night. Per night RN, he was much stronger than expected. This was possibly precipitated by switching from PS back to [facility]. Received 1 dose of haldol overnight.   Objective   Blood pressure (!) 158/94, pulse 86, temperature 99.5 F (37.5 C), temperature source Oral, resp. rate (!) 21, height 5\' 10"  (1.778 m), weight 121.7 kg, SpO2 100 %.     Vent Mode: PSV;CPAP FiO2 (%):  [40 %] 40 % Set Rate:  [18 bmp] 18 bmp Vt Set:  [500 mL-580 mL] 580 mL PEEP:  [5 cmH20] 5 cmH20 Pressure Support:  [12 cmH20] 12 cmH20 Plateau Pressure:  [20 cmH20-31 cmH20] 31 cmH20   Intake/Output Summary (Last 24 hours) at 11/05/2020 01/05/2021 Last data filed at 11/05/2020 0700 Gross per 24 hour  Intake 3668.15 ml  Output 2825 ml  Net 843.15 ml   Filed Weights   11/03/20 1330 11/04/20 0500 11/05/20 0500  Weight: 122 kg 114.6 kg 121.7 kg    Examination: General: critically ill appearing man laying in bed in NAD, appears stated age HENT: [place]/AT, eyes anicteric Lungs: breathing comfortably on PS 12, CTAB, minimal ETT secretions Cardiovascular: S1S2, RRR, no murmurs Abdomen: distended, soft, NT, tympanitic to percussion Extremities: pitting LE edema, no cyanosis Neuro: RASS -2, moving all extremities on command, but weak. Comprehension appears intact. Gu: foley in place, clear yellow urine, swollen    Resolved problems:     Assessment & Plan:   Presented with agitated delirium and rhabdo, suspecting Wellbutrin and Seroquel as culprits but surreptitious drug use being in the differential although UDS negative x2.  No evidence occult seizure activity. -Initially improved and extubated but then intubated for airway protection due to worsening somnolence on 3/6  Acute hypoxic respiratory failure requiring MV -reintubated 3/6 -Con't LTVV, 4-8cc/kg IBW with goal Pplat <30 and DP<15 -VAP prevention  protocol -Daily SAT & SBT- tolerating PS 12 very well. SBT this AM on PS 8, would like to work towards extubation this weekend if possible. -wean FiO2 as able -Goal euvolemia; no plans for dialysis today. -PAD protocol for sedation; wean sedation as able.  Acute encephalopathy with hyperactive delirium; delirium controlled currently. 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 UDS and head CT negative on admit, he denies history of drug abuse, 3/5 staff notes girlfriend visiting before his deterioration in mental status. She was noted to be unsteady, suspicious for surreptitious drug use, UDS repeated 3/6 only shows benzos and opiates which are prescribed medicines. MRI brain, CSF reassuring thus far. -Appreciate Nephrology's input- dialysis for metabolic clearance and volume removal per their recommendations -Holding Wellbutrin, Seroquel -Currently on fentanyl, Precedex infusions. Titrating off as able.  Xanax available -No evidence for meningitis on CSF, no clear indication for acyclovir, although HSV not checked. -Completed empiric antibiotics for possible infection as below -B12 supplementation  Aspiration pneumonitis without clear pneumonia sphenoid sinusitis -7-day course antibiotics completed on 3/9 -Remains afebrile off antibiotics, although WBC uptrending, but PCT reduced compared to 3/9. Con't to follow fever curve.  Leukocytosis -with diarrhea, concern for infectious diarrhea; fecal lactoferrin pending -con't to monitor off antibiotics -KUB -unfortunately anatomy not amenable for external catheter due to swelling at this time; will have to continue foley for today  Hypertension, not on home agents -Continue hydralazine PRN -Continue amlodipine 10 mg once daily -increase coregdose-  6.25mg  BID  AKI , rhabdomyolysis Acute AG metabolic acidosis- resolved Hyperphosphatemia Cause unclear.  Question meds vs aggressively fighting/ resisting.  No overt seizure activity noted.  Question shoulder injury based on his complaints of pain -Repeat CK 3/9 shows continued clearance -Net ++++ volume status; stop any maintenance IVF. -Appreciate Nephro's management; needs to continue HD catheter for now. -Con't daily monitoring.  Bloody/coffee-ground NG aspirate- resolved -PPI daily; monitor for signs of bleeding. -Continue to follow intermittent  CBC -Tolerating prophylactic subcutaneous heparin  Hyperglycemia -con't to monitor; still not requiring insulin  Diarrhea -fecal lactoferrin pending -deescalate bowel regimen  Anemia due to critical illness, dilution -con't to monitor -transfuse for Hb<7 or hemodynamically significant bleeding -goal of euvolemia    Best practice (evaluated daily)  Diet: TF Pain/Anxiety/Delirium protocol (if indicated): Fent gtt + precedex VAP protocol (if indicated): yes DVT prophylaxis: heparin GI prophylaxis: protonix Glucose control: SSI Mobility: bedrest Disposition:  ICU Family: father Joe updated 3/12  Goals of Care:   Code Status: Full  Labs   CBC: Recent Labs  Lab 10/31/20 0530 11/02/20 0129 11/03/20 0143 11/04/20 0208 11/05/20 0311  WBC 10.9* 9.5 11.1* 12.4* 13.2*  HGB 10.3* 10.0* 9.5* 9.1* 8.5*  HCT 30.0* 29.1* 29.3* 28.0* 27.0*  MCV 88.5 90.7 93.0 94.3 96.8  PLT 311 341 238 338 306    Basic Metabolic Panel: Recent Labs  Lab 10/31/20 0530 11/01/20 0152 11/01/20 0803 11/01/20 1106 11/02/20 0129 11/03/20 0143 11/04/20 0208 11/05/20 0311  NA 130*  --    < > 135 135 141 141 141  K 3.3*  --    < > 4.0 4.2 4.3 3.9 4.0  CL 86*  --    < > 97* 101 105 107 107  CO2 22  --    < > 20* 17* 20* 22 26  GLUCOSE 86  --    < > 117* 132* 132* 154* 146*  BUN 72*  --    < >  83* 90* 93* 71* 49*  CREATININE 11.69*  --    < > 11.51* 11.14* 10.29* 7.16* 4.53*  CALCIUM 6.2*  --    < > 6.9* 7.0* 7.7* 8.0* 8.3*  MG 2.2 2.2  --   --  2.2 2.1 2.0  --   PHOS 7.0* 7.0*  --   --  6.8* 6.9* 5.9*  --    < > = values in this interval not displayed.   GFR: Estimated Creatinine Clearance: 27.8 mL/min (A) (by C-G formula based on SCr of 4.53 mg/dL (H)). Recent Labs  Lab 11/01/20 0152 11/02/20 0129 11/03/20 0143 11/04/20 0208 11/05/20 0311  PROCALCITON 3.92 3.65  --  1.29 0.72  WBC  --  9.5 11.1* 12.4* 13.2*  LATICACIDVEN  --  1.3  --   --   --     Liver Function Tests: Recent  Labs  Lab 10/31/20 0530  ALBUMIN 2.8*   No results for input(s): LIPASE, AMYLASE in the last 168 hours. Recent Labs  Lab 10/31/20 0949  AMMONIA 37*    ABG    Component Value Date/Time   PHART 7.414 11/02/2020 0323   PCO2ART 30.1 (L) 11/02/2020 0323   PO2ART 157 (H) 11/02/2020 0323   HCO3 19.0 (L) 11/02/2020 0323   TCO2 36 (H) 10/30/2020 2104   ACIDBASEDEF 4.8 (H) 11/02/2020 0323   O2SAT 99.1 11/02/2020 0323     Coagulation Profile: No results for input(s): INR, PROTIME in the last 168 hours.  Cardiac Enzymes: Recent Labs  Lab 10/29/20 1047 10/30/20 0159 11/02/20 0129 11/03/20 1231 11/05/20 0034  CKTOTAL 41,656* 28,110* 6,322* 3,027* 1,481*    HbA1C: No results found for: HGBA1C  CBG: Recent Labs  Lab 11/04/20 1104 11/04/20 1510 11/04/20 1936 11/04/20 2321 11/05/20 0317  GLUCAP 150* 115* 128* 120* 135*    This patient is critically ill with multiple organ system failure which requires frequent high complexity decision making, assessment, support, evaluation, and titration of therapies. This was completed through the application of advanced monitoring technologies and extensive interpretation of multiple databases. During this encounter critical care time was devoted to patient care services described in this note for 41 minutes.  Steffanie Dunn, DO 11/05/20 9:28 AM  [facility]  From 7AM- 7PM if no response to pager, please call [phone]. After hours, 7PM- 7AM, please call Elink  [phone].

## 2020-11-05 NOTE — Progress Notes (Signed)
Neurology Progress Note  Initial reason for Consult:New onset hypoactive delirum  Interval Events / Subjective: - Received Haldol 2 mg at midnight for agitation, x1 dose - CK downtrending still (now 1481) - Somewhat calmer this morning, communication remains limited secondary to intubation  Exam: Vitals:   11/05/20 0700 11/05/20 0739  BP: (!) 158/94   Pulse: 86   Resp: (!) 21   Temp:  98.8 F (37.1 C)  SpO2: 100%    Gen: In bed, slightly anxious/agitated Resp: Overall appears comfortable on the ventilator Abd: Still very firm Extremities: Edema is improving Eyes: No scleral injection or edema HENT: Remains intubated CV: RRR Skin: Warm and dry Psych: More cooperative and calm today  Neurological exam Awake and alert.  Orientation not assessed today to minimize agitation II: Visual Fields are grossly full. Pupils are equal, round, and reactive to light.  III,IV, VI: EOMI grossly intact.  V:Equal response to light eyelash brush bilaterally VII: Facial movement is symmetricin the upper face, lower face limited secondary to ET tube.  VIII: hearing is intact to voice XI/X:Not assessed today  Reflexes: 3/12 reflexes now 1+ and present in the bilateral patellas, still absent at the Achilles  Motor: Tone is normal. Bulk isdifficult to assess due toto edema but likely normal. Patient is able to give me at least 4/5 strength throughout the upper extremities, and does wiggle bilateral toes today.  He moves his proximal legs today as well.  Sensory: Appears grossly equally reactive to stimulation in all 4 extremities  Pertinent Labs: CBC Latest Ref Rng & Units 11/05/2020 11/04/2020 11/03/2020  WBC 4.0 - 10.5 K/uL 13.2(H) 12.4(H) 11.1(H)  Hemoglobin 13.0 - 17.0 g/dL 8.6(V) 7.8(I) 6.9(G)  Hematocrit 39.0 - 52.0 % 27.0(L) 28.0(L) 29.3(L)  Platelets 150 - 400 K/uL 306 338 238   CMP Latest Ref Rng & Units 11/05/2020 11/04/2020 11/03/2020  Glucose 70 - 99 mg/dL 295(M) 841(L)  244(W)  BUN 6 - 20 mg/dL 10(U) 72(Z) 36(U)  Creatinine 0.61 - 1.24 mg/dL 4.40(H) 4.74(Q) 59.56(L)  Sodium 135 - 145 mmol/L 141 141 141  Potassium 3.5 - 5.1 mmol/L 4.0 3.9 4.3  Chloride 98 - 111 mmol/L 107 107 105  CO2 22 - 32 mmol/L 26 22 20(L)  Calcium 8.9 - 10.3 mg/dL 8.3(L) 8.0(L) 7.7(L)  Total Protein 6.5 - 8.1 g/dL - - -  Total Bilirubin 0.3 - 1.2 mg/dL - - -  Alkaline Phos 38 - 126 U/L - - -  AST 15 - 41 U/L - - -  ALT 0 - 44 U/L - - -    Impression:  Andrew [name] is a 40 yo patient with a hx of MDD and PTSD.   He does appear to be improving/stabilizing with dialysis and hopefully with continued renal recovery.   Recommendations: - Continue B12 supplementation - Delirium precautions: consistent reorientation, quiet sleeping atmosphere, minimizing excess noise and restraints - Appreciate psychiatry recommendations for his antipsychotic medication management - Appreciate nephrology management of hemodialysis - Appreciate excellent care of the patient's other comorbidities and ventilator by CCM team -Neurology will sign off at this time as patient has improved over the past 2 days and trajectory is expected to be continued improvement with excellent supportive care by multidisciplinary team as above.  Please reach out with any questions or concerns   CRITICAL CARE Performed by: Gordy Councilman  Total critical care time: 36 minutes  Critical care time was exclusive of separately billable procedures and treating other patients.  Critical care  was necessary to treat or prevent imminent or life-threatening deterioration.  Critical care was time spent personally by me on the following activities: development of treatment plan with patient and/or surrogate as well as nursing, discussions with consultants, evaluation of patient's response to treatment, examination of patient, obtaining history from patient or surrogate, ordering and performing treatments and interventions, ordering  and review of laboratory studies, ordering and review of radiographic studies, pulse oximetry and re-evaluation of patient's condition.

## 2020-11-05 NOTE — Progress Notes (Addendum)
eLink Physician-Brief Progress Note Patient Name: Andrew [name] DOB: 1981-03-16 MRN: [id number]   Date of Service  11/05/2020  HPI/Events of Note  Patient on 400 mcg of Fentanyl infusion + Precedex at 1.2 mcg and looking wild eyed and terrified on the ventilator. QTc is 420 measured off the monitor, and 460 on his last EKG on 10/28/20 (patient too agitated for a 12 lead EKG).  eICU Interventions  PRN iv Haldol ordered.        Thomasene Lot Meagan Spease 11/05/2020, 12:11 AM

## 2020-11-05 NOTE — Consult Note (Signed)
Reason for Consult:''Can you please weigh in, concern that his antipsychotic meds were contributing to his rhabdomyolysis, which have since been stopped but now he is exhibiting psychotic features. He is intubated but communicating/ writing.''  Objective:  Patient is 40 y.o. 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 Today, patient is unable to participate in full psychiatric evaluation due to being on mechanical ventilation. Chart review revealed that patient was agitated and required high dose sedation. However, it is difficult to determine psychosis in a patient that is on mechanical ventilator.  Patient's lab is reviewed and there is indication that CK has been trending down: CK level-41,656 on 03/05. It is 1, 481 today-11/05/20. We may consider a careful antipsychotic  re-challenge when CK Level is below 300 and if patient is deemed psychotic.  Recommendations: -Consider checking CK level daily. -Do not start Seroquel or any other antipsychotic until CK Total level is below 300 due to patient's presentation with rhabdomyolysis which may have been induced by anti-psychotic and or severe agitation. -Daily contact with patient to assess and evaluate symptoms and progress in treatment. -Psychiatry will continue to follow.  Thedore Mins, MD Attending psychiatrist

## 2020-11-05 NOTE — Progress Notes (Signed)
Patient ID: Andrew [name], male   DOB: 06/23/1981, 40 y.o.   MRN: [id number] S: Patient tolerated dialysis again yesterday.  Neurology felt his strength was improving.  However, still remains weak and difficulty to wean from the vent.  Remains intermittently agitated O:BP (!) 158/94   Pulse 86   Temp 98.8 F (37.1 C) (Oral)   Resp (!) 21   Ht 5' 10"  (1.778 m)   Wt 121.7 kg   SpO2 100%   BMI 38.50 kg/m   Intake/Output Summary (Last 24 hours) at 11/05/2020 0940 Last data filed at 11/05/2020 0700 Gross per 24 hour  Intake 3232.83 ml  Output 2675 ml  Net 557.83 ml   Intake/Output: I/O last 3 completed shifts: In: 5494.6 [I.V.:3164.6; NG/GT:2330] Out: 4205 [Urine:2805; Other:1000; Stool:400]  Intake/Output this shift:  No intake/output data recorded. Weight change: -1.3 kg Gen: In bed, no distress CVS: Normal rate Resp: Bilateral chest rise, ventilated, coarse breath sounds Abd: Mild distention, +BS, NT Ext: 1+ pitting edema in the bilateral lower extremities, warm and well perfused  Recent Labs  Lab 10/29/20 1047 10/30/20 0159 10/30/20 2104 10/31/20 0530 11/01/20 0152 11/01/20 0803 11/01/20 1106 11/02/20 0129 11/03/20 0143 11/04/20 0208 11/05/20 0311  NA 131* 129*   < > 130*  --  132* 135 135 141 141 141  K 3.0* 3.2*   < > 3.3*  --  4.0 4.0 4.2 4.3 3.9 4.0  CL 90* 83*  --  86*  --  94* 97* 101 105 107 107  CO2 23 27  --  22  --  17* 20* 17* 20* 22 26  GLUCOSE 124* 110*  --  86  --  108* 117* 132* 132* 154* 146*  BUN 61* 64*  --  72*  --  81* 83* 90* 93* 71* 49*  CREATININE 8.88* 10.16*  --  11.69*  --  11.94* 11.51* 11.14* 10.29* 7.16* 4.53*  ALBUMIN  --   --   --  2.8*  --   --   --   --   --   --   --   CALCIUM 7.0* 6.5*  --  6.2*  --  6.7* 6.9* 7.0* 7.7* 8.0* 8.3*  PHOS 6.0* 7.3*  --  7.0* 7.0*  --   --  6.8* 6.9* 5.9*  --    < > = values in this interval not displayed.   Liver Function Tests: Recent Labs  Lab 10/31/20 0530  ALBUMIN 2.8*   No results for  input(s): LIPASE, AMYLASE in the last 168 hours. Recent Labs  Lab 10/31/20 0949  AMMONIA 37*   CBC: Recent Labs  Lab 10/31/20 0530 11/02/20 0129 11/03/20 0143 11/04/20 0208 11/05/20 0311  WBC 10.9* 9.5 11.1* 12.4* 13.2*  HGB 10.3* 10.0* 9.5* 9.1* 8.5*  HCT 30.0* 29.1* 29.3* 28.0* 27.0*  MCV 88.5 90.7 93.0 94.3 96.8  PLT 311 341 238 338 306   Cardiac Enzymes: Recent Labs  Lab 10/29/20 1047 10/30/20 0159 11/02/20 0129 11/03/20 1231 11/05/20 0034  CKTOTAL 41,656* 28,110* 6,322* 3,027* 1,481*   CBG: Recent Labs  Lab 11/04/20 1510 11/04/20 1936 11/04/20 2321 11/05/20 0317 11/05/20 0732  GLUCAP 115* 128* 120* 135* 129*    Iron Studies: No results for input(s): IRON, TIBC, TRANSFERRIN, FERRITIN in the last 72 hours. Studies/Results: DG Abd 1 View  Result Date: 11/03/2020 CLINICAL DATA:  Abdominal distension and diarrhea EXAM: ABDOMEN - 1 VIEW COMPARISON:  October 27, 2020 FINDINGS: Nasogastric tube tip and side  port are in the stomach. There are loops of dilated bowel without appreciable air-fluid levels. No free air. Visualized lung bases clear. IMPRESSION: Nasogastric tube tip and side port in stomach. Bowel dilatation without air-fluid levels. Suspect a degree of ileus or enteritis. Bowel obstruction less likely. No free air evident. Electronically Signed   By: Lowella Grip III M.D.   On: 11/03/2020 15:42   DG Chest Port 1 View  Result Date: 11/04/2020 CLINICAL DATA:  Hypoxia EXAM: PORTABLE CHEST 1 VIEW COMPARISON:  November 02, 2020 FINDINGS: Endotracheal tube tip is 2.8 cm above the carina. Nasogastric tube tip and side port are below the diaphragm. Central catheter tip is in the superior vena cava. No pneumothorax. There is no edema or airspace opacity. Heart is upper normal in size with pulmonary vascularity normal. No adenopathy. No bone lesions. IMPRESSION: Tube and catheter positions as described without pneumothorax. No edema or airspace opacity. Stable cardiac  prominence. Electronically Signed   By: Lowella Grip III M.D.   On: 11/04/2020 07:54   . amLODipine  10 mg Per Tube Daily  . carvedilol  3.125 mg Oral Once  . carvedilol  6.25 mg Per Tube BID WC  . chlorhexidine gluconate (MEDLINE KIT)  15 mL Mouth Rinse BID  . Chlorhexidine Gluconate Cloth  6 each Topical Q0600  . Chlorhexidine Gluconate Cloth  6 each Topical Q0600  . feeding supplement (PROSource TF)  45 mL Per Tube QID  . heparin injection (subcutaneous)  5,000 Units Subcutaneous Q8H  . mouth rinse  15 mL Mouth Rinse 10 times per day  . multivitamin  15 mL Per Tube Daily  . pantoprazole sodium  40 mg Per Tube Daily  . sodium chloride flush  10-40 mL Intracatheter Q12H  . vitamin B-12  1,000 mcg Per Tube Daily    BMET    Component Value Date/Time   NA 141 11/05/2020 0311   K 4.0 11/05/2020 0311   CL 107 11/05/2020 0311   CO2 26 11/05/2020 0311   GLUCOSE 146 (H) 11/05/2020 0311   BUN 49 (H) 11/05/2020 0311   CREATININE 4.53 (H) 11/05/2020 0311   CALCIUM 8.3 (L) 11/05/2020 0311   GFRNONAA 16 (L) 11/05/2020 0311   CBC    Component Value Date/Time   WBC 13.2 (H) 11/05/2020 0311   RBC 2.79 (L) 11/05/2020 0311   HGB 8.5 (L) 11/05/2020 0311   HCT 27.0 (L) 11/05/2020 0311   PLT 306 11/05/2020 0311   MCV 96.8 11/05/2020 0311   MCH 30.5 11/05/2020 0311   MCHC 31.5 11/05/2020 0311   RDW 13.9 11/05/2020 0311    Assessment/Plan: 1.  Severe AKI secondary to rhabdomyolysis: Creatinine improving and urine output adequate.  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 renal US without obstruction.   2. GN serologies negative 3. CK levels declining 4. Hold further dialysis for now and monitor creatinine trend 2. 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  Has improved with dialysis x2.  Now monitor for renal recovery.  Overall weakness likely multifactorial with severe rhabdomyolysis contributing 3. Acute hyperactive delirium -likely  multifactorial.  Toxic metabolic encephalopathy possibly contributing to some extent.  Neurology following, appreciate help 4. Acute rhabdomyolysis -much improved 5. Hypertension: likely will improve with improving UOP. CTM 6. Acute hypoxic respiratory failure with ventilatory dependence: Largely related to weakness.  Wean as able

## 2020-11-06 ENCOUNTER — Inpatient Hospital Stay (HOSPITAL_COMMUNITY): Payer: Medicaid Other

## 2020-11-06 LAB — BASIC METABOLIC PANEL
Anion gap: 6 (ref 5–15)
Anion gap: 11 (ref 5–15)
BUN: 53 mg/dL — ABNORMAL HIGH (ref 6–20)
BUN: 58 mg/dL — ABNORMAL HIGH (ref 6–20)
CO2: 28 mmol/L (ref 22–32)
CO2: 24 mmol/L (ref 22–32)
Calcium: 8.7 mg/dL — ABNORMAL LOW (ref 8.9–10.3)
Calcium: 9 mg/dL (ref 8.9–10.3)
Chloride: 111 mmol/L (ref 98–111)
Chloride: 109 mmol/L (ref 98–111)
Creatinine, Ser: 3.86 mg/dL — ABNORMAL HIGH (ref 0.61–1.24)
Creatinine, Ser: 3.66 mg/dL — ABNORMAL HIGH (ref 0.61–1.24)
GFR, Estimated: 19 mL/min — ABNORMAL LOW (ref >60)
GFR, Estimated: 20 mL/min — ABNORMAL LOW (ref >60)
Glucose, Bld: 115 mg/dL — ABNORMAL HIGH (ref 70–99)
Glucose, Bld: 138 mg/dL — ABNORMAL HIGH (ref 70–99)
Potassium: 5 mmol/L (ref 3.5–5.1)
Potassium: 5.3 mmol/L — ABNORMAL HIGH (ref 3.5–5.1)
Sodium: 145 mmol/L (ref 135–145)
Sodium: 144 mmol/L (ref 135–145)

## 2020-11-06 LAB — GLUCOSE, CAPILLARY
Glucose-Capillary: 111 mg/dL — ABNORMAL HIGH (ref 70–99)
Glucose-Capillary: 111 mg/dL — ABNORMAL HIGH (ref 70–99)
Glucose-Capillary: 115 mg/dL — ABNORMAL HIGH (ref 70–99)
Glucose-Capillary: 117 mg/dL — ABNORMAL HIGH (ref 70–99)
Glucose-Capillary: 90 mg/dL (ref 70–99)
Glucose-Capillary: 88 mg/dL (ref 70–99)

## 2020-11-06 LAB — CBC
HCT: 27.4 % — ABNORMAL LOW (ref 39.0–52.0)
Hemoglobin: 8.6 g/dL — ABNORMAL LOW (ref 13.0–17.0)
MCH: 30.6 pg (ref 26.0–34.0)
MCHC: 31.4 g/dL (ref 30.0–36.0)
MCV: 97.5 fL (ref 80.0–100.0)
Platelets: 335 10*3/uL (ref 150–400)
RBC: 2.81 MIL/uL — ABNORMAL LOW (ref 4.22–5.81)
RDW: 13.8 % (ref 11.5–15.5)
WBC: 13.9 10*3/uL — ABNORMAL HIGH (ref 4.0–10.5)
nRBC: 0 % (ref 0.0–0.2)

## 2020-11-06 LAB — TROPONIN I (HIGH SENSITIVITY)
Troponin I (High Sensitivity): 20 ng/L — ABNORMAL HIGH (ref <18)
Troponin I (High Sensitivity): 24 ng/L — ABNORMAL HIGH (ref <18)

## 2020-11-06 LAB — LACTIC ACID, PLASMA
Lactic Acid, Venous: 1.5 mmol/L (ref 0.5–1.9)
Lactic Acid, Venous: 1.1 mmol/L (ref 0.5–1.9)

## 2020-11-06 LAB — PROCALCITONIN: Procalcitonin: 0.42 ng/mL

## 2020-11-06 MED ORDER — PROPOFOL 1000 MG/100ML IV EMUL
0.0000 ug/kg/min | INTRAVENOUS | Status: DC
  Administered 2020-11-06: 80 ug/kg/min via INTRAVENOUS
  Administered 2020-11-06 (×2): 40 ug/kg/min via INTRAVENOUS
  Administered 2020-11-06: 25 ug/kg/min via INTRAVENOUS
  Administered 2020-11-06: 20 ug/kg/min via INTRAVENOUS
  Administered 2020-11-07: 30 ug/kg/min via INTRAVENOUS
  Administered 2020-11-07: 25 ug/kg/min via INTRAVENOUS
  Filled 2020-11-06 (×5): qty 100
  Filled 2020-11-06: qty 200

## 2020-11-06 MED ORDER — MIDAZOLAM HCL 2 MG/2ML IJ SOLN: 2.0000 mg | INTRAMUSCULAR | Status: DC | PRN

## 2020-11-06 MED ORDER — METOPROLOL TARTRATE 5 MG/5ML IV SOLN
5.0000 mg | Freq: Four times a day (QID) | INTRAVENOUS | Status: DC
  Administered 2020-11-06 – 2020-11-07 (×3): 5 mg via INTRAVENOUS
  Filled 2020-11-06 (×4): qty 5

## 2020-11-06 MED ORDER — SODIUM ZIRCONIUM CYCLOSILICATE 5 G PO PACK
10.0000 g | PACK | Freq: Once | ORAL | Status: AC
  Administered 2020-11-06: 10 g
  Filled 2020-11-06: qty 2

## 2020-11-06 MED ORDER — LABETALOL HCL 5 MG/ML IV SOLN
10.0000 mg | Freq: Once | INTRAVENOUS | Status: AC
  Administered 2020-11-06: 10 mg via INTRAVENOUS
  Filled 2020-11-06: qty 4

## 2020-11-06 MED ORDER — MIDAZOLAM BOLUS VIA INFUSION
1.0000 mg | INTRAVENOUS | Status: DC | PRN
  Administered 2020-11-07 (×2): 2 mg via INTRAVENOUS
  Filled 2020-11-06: qty 2

## 2020-11-06 MED ORDER — ALBUTEROL SULFATE (2.5 MG/3ML) 0.083% IN NEBU
10.0000 mg | INHALATION_SOLUTION | Freq: Once | RESPIRATORY_TRACT | Status: AC
  Administered 2020-11-06: 10 mg via RESPIRATORY_TRACT
  Filled 2020-11-06: qty 12

## 2020-11-06 MED ORDER — FUROSEMIDE 10 MG/ML IJ SOLN
60.0000 mg | Freq: Once | INTRAMUSCULAR | Status: AC
  Administered 2020-11-06: 60 mg via INTRAVENOUS
  Filled 2020-11-06: qty 6

## 2020-11-06 MED ORDER — MIDAZOLAM 50MG/50ML (1MG/ML) PREMIX INFUSION
0.0000 mg/h | INTRAVENOUS | Status: DC
  Administered 2020-11-06: 2 mg/h via INTRAVENOUS
  Administered 2020-11-06: 5 mg/h via INTRAVENOUS
  Administered 2020-11-07: 2 mg/h via INTRAVENOUS
  Filled 2020-11-06 (×3): qty 50

## 2020-11-06 MED ORDER — METOCLOPRAMIDE HCL 5 MG/ML IJ SOLN
5.0000 mg | Freq: Three times a day (TID) | INTRAMUSCULAR | Status: DC
  Administered 2020-11-06 – 2020-11-11 (×14): 5 mg via INTRAVENOUS
  Filled 2020-11-06 (×14): qty 2

## 2020-11-06 MED ORDER — MIDAZOLAM HCL 2 MG/2ML IJ SOLN
2.0000 mg | INTRAMUSCULAR | Status: DC | PRN
Start: 2020-11-06 — End: 2020-11-06

## 2020-11-06 MED ORDER — CARVEDILOL 12.5 MG PO TABS: 12.5000 mg | ORAL_TABLET | Freq: Two times a day (BID) | ORAL | Status: DC

## 2020-11-06 MED ORDER — METRONIDAZOLE IN NACL 5-0.79 MG/ML-% IV SOLN
500.0000 mg | Freq: Three times a day (TID) | INTRAVENOUS | Status: DC
  Administered 2020-11-06 – 2020-11-07 (×3): 500 mg via INTRAVENOUS
  Filled 2020-11-06 (×3): qty 100

## 2020-11-06 MED ORDER — METOPROLOL TARTRATE 5 MG/5ML IV SOLN
5.0000 mg | Freq: Once | INTRAVENOUS | Status: AC
  Administered 2020-11-06: 5 mg via INTRAVENOUS
  Filled 2020-11-06: qty 5

## 2020-11-06 MED ORDER — CARVEDILOL 12.5 MG PO TABS
6.2500 mg | ORAL_TABLET | Freq: Once | ORAL | Status: AC
  Administered 2020-11-06: 6.25 mg
  Filled 2020-11-06: qty 1

## 2020-11-06 MED ORDER — SODIUM CHLORIDE 0.9 % IV SOLN
2.0000 g | INTRAVENOUS | Status: AC
  Administered 2020-11-06 – 2020-11-08 (×3): 2 g via INTRAVENOUS
  Filled 2020-11-06 (×3): qty 20

## 2020-11-06 MED ORDER — METHYLNALTREXONE BROMIDE 12 MG/0.6ML ~~LOC~~ SOLN
6.0000 mg | Freq: Once | SUBCUTANEOUS | Status: AC
  Administered 2020-11-06: 6 mg via SUBCUTANEOUS
  Filled 2020-11-06: qty 0.6

## 2020-11-06 MED ORDER — MIDAZOLAM HCL 2 MG/2ML IJ SOLN
4.0000 mg | Freq: Once | INTRAMUSCULAR | Status: AC
  Administered 2020-11-06: 4 mg via INTRAVENOUS
  Filled 2020-11-06: qty 4

## 2020-11-06 NOTE — Progress Notes (Signed)
eLink Physician-Brief Progress Note Patient Name: Andrew [name] DOB: 1980/09/24 MRN: [id number]   Date of Service  11/06/2020  HPI/Events of Note  RN requests order for metoprolol 5mg  IV x1 dose to make up for a missed scheduled dose of the same from 1800 hrs. HR 112 and BP 163/96.   eICU Interventions  Order entered.     Intervention Category Intermediate Interventions: Arrhythmia - evaluation and management  11/06/2020, 9:41 PM

## 2020-11-06 NOTE — Progress Notes (Addendum)
Patient ID: Andrew [name], male   DOB: 10/24/1980, 40 y.o.   MRN: [id number] S: Patient remained stable somewhat improved today.  Tends to make excellent urine O:BP (!) 181/105   Pulse 87   Temp 99.6 F (37.6 C) (Oral)   Resp 19   Ht _0  (1.778 m)   Wt 111.4 kg   SpO2 100%   BMI 35.24 kg/m   Intake/Output Summary (Last 24 hours) at 11/06/2020 0935 Last data filed at 11/06/2020 0800 Gross per 24 hour  Intake 1482.27 ml  Output 3350 ml  Net -1867.73 ml   Intake/Output: I/O last 3 completed shifts: In: 3369.4 [I.V.:2449.4; NG/GT:920] Out: 6295 [Urine:3675; Emesis/NG output:400; Stool:100]  Intake/Output this shift:  Total I/O In: 73.3 [I.V.:73.3] Out: -  Weight change: -10.3 kg Gen: In bed, no distress CVS: Normal rate Resp: Bilateral chest rise, ventilated Abd: Mild distention, +BS, NT Ext: 1+ pitting edema in the bilateral lower extremities, warm and well perfused  Recent Labs  Lab 10/31/20 0530 11/01/20 0152 11/01/20 0803 11/01/20 1106 11/02/20 0129 11/03/20 0143 11/04/20 0208 11/05/20 0311 11/06/20 0416  NA 130*  --  132* 135 135 141 141 141 145  K 3.3*  --  4.0 4.0 4.2 4.3 3.9 4.0 5.0  CL 86*  --  94* 97* 101 105 107 107 111  CO2 22  --  17* 20* 17* 20* _1 GLUCOSE 86  --  108* 117* 132* 132* 154* 146* 115*  BUN 72*  --  81* 83* 90* 93* 71* 49* 53*  CREATININE 11.69*  --  11.94* 11.51* 11.14* 10.29* 7.16* 4.53* 3.86*  ALBUMIN 2.8*  --   --   --   --   --   --   --   --   CALCIUM 6.2*  --  6.7* 6.9* 7.0* 7.7* 8.0* 8.3* 8.7*  PHOS 7.0* 7.0*  --   --  6.8* 6.9* 5.9*  --   --    Liver Function Tests: Recent Labs  Lab 10/31/20 0530  ALBUMIN 2.8*   No results for input(s): LIPASE, AMYLASE in the last 168 hours. Recent Labs  Lab 10/31/20 0949  AMMONIA 37*   CBC: Recent Labs  Lab 11/02/20 0129 11/03/20 0143 11/04/20 0208 11/05/20 0311 11/06/20 0416  WBC 9.5 11.1* 12.4* 13.2* 13.9*  HGB 10.0* 9.5* 9.1* 8.5* 8.6*  HCT 29.1* 29.3* 28.0*  27.0* 27.4*  MCV 90.7 93.0 94.3 96.8 97.5  PLT 341 238 338 306 335   Cardiac Enzymes: Recent Labs  Lab 11/02/20 0129 11/03/20 1231 11/05/20 0034  CKTOTAL 6,322* 3,027* 1,481*   CBG: Recent Labs  Lab 11/05/20 1509 11/05/20 1939 11/05/20 2329 11/06/20 0328 11/06/20 0802  GLUCAP 112* 110* 109* 111* 111*    Iron Studies: No results for input(s): IRON, TIBC, TRANSFERRIN, FERRITIN in the last 72 hours. Studies/Results: DG Abd 1 View  Result Date: 11/05/2020 CLINICAL DATA:  Abdominal distension EXAM: ABDOMEN - 1 VIEW COMPARISON:  11/03/2020 FINDINGS: Enteric tube terminates within the gastric body. Persistent air-filled loops of small and large bowel within the abdomen, overall decreased from recent comparison study. No gross free intraperitoneal air on AP supine view. IMPRESSION: Persistent air-filled loops of small and large bowel within the abdomen, overall decreased from recent comparison study. Findings suggestive of ileus or enteritis rather than obstruction. Continued radiographic follow-up recommended. Electronically Signed   By: Davina Poke D.O.   On: 11/05/2020 12:49   . amLODipine  10 mg Per Tube  Daily  . carvedilol  12.5 mg Per Tube BID WC  . carvedilol  6.25 mg Per Tube Once  . chlorhexidine gluconate (MEDLINE KIT)  15 mL Mouth Rinse BID  . Chlorhexidine Gluconate Cloth  6 each Topical Q0600  . Chlorhexidine Gluconate Cloth  6 each Topical Q0600  . feeding supplement (PROSource TF)  45 mL Per Tube QID  . heparin injection (subcutaneous)  5,000 Units Subcutaneous Q8H  . mouth rinse  15 mL Mouth Rinse 10 times per day  . methylnaltrexone  6 mg Subcutaneous Once  . multivitamin  15 mL Per Tube Daily  . pantoprazole sodium  40 mg Per Tube Daily  . sodium chloride flush  10-40 mL Intracatheter Q12H  . vitamin B-12  1,000 mcg Per Tube Daily    BMET    Component Value Date/Time   NA 145 11/06/2020 0416   K 5.0 11/06/2020 0416   CL 111 11/06/2020 0416   CO2 28  11/06/2020 0416   GLUCOSE 115 (H) 11/06/2020 0416   BUN 53 (H) 11/06/2020 0416   CREATININE 3.86 (H) 11/06/2020 0416   CALCIUM 8.7 (L) 11/06/2020 0416   GFRNONAA 19 (L) 11/06/2020 0416   CBC    Component Value Date/Time   WBC 13.9 (H) 11/06/2020 0416   RBC 2.81 (L) 11/06/2020 0416   HGB 8.6 (L) 11/06/2020 0416   HCT 27.4 (L) 11/06/2020 0416   PLT 335 11/06/2020 0416   MCV 97.5 11/06/2020 0416   MCH 30.6 11/06/2020 0416   MCHC 31.4 11/06/2020 0416   RDW 13.8 11/06/2020 0416    Assessment/Plan: 1.  Severe AKI secondary to rhabdomyolysis: Creatinine improving and urine output adequate.  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 renal US without obstruction.   2. GN serologies negative 3. CK levels now declined 4. Hold further dialysis for now and monitor creatinine trend 5. Creatinine improving but BUN slightly raised today; consider removal of dialysis catheter tomorrow 2. 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  Has improved with dialysis x2.  Holding further dialysis for now and monitoring for improvement.  His weakness is likely multifactorial given his severe rhabdomyolysis 3. Acute hyperactive delirium -likely multifactorial.  Toxic metabolic encephalopathy possibly contributing to some extent.  Neurology following, appreciate help 4. Acute rhabdomyolysis -much improved 5. Hypertension: likely will improve with improving UOP.  Currently on amlodipine and carvedilol.  Could consider hydralazine if needed.  CTM 6. Acute hypoxic respiratory failure with ventilatory dependence: Largely related to weakness.  Wean as able

## 2020-11-06 NOTE — Progress Notes (Signed)
Patient had sudden onset chest pain, and abdominal pain.  Became tachycardic and hypertensive, diaphoretic and abd distended.  Notified MD immediately.  Orders received for stat EKG, stat chest xray and KUB.  To give 20mg  of hydralazine and 10mg  of labetalol for hypertension.  Propofol and versed was added too to get patient comfortable for stat CT of abdomen.  Will continue to monitor patient.

## 2020-11-06 NOTE — Progress Notes (Deleted)
Zio patch placed onto patient.  All instructions and information reviewed with patient, they verbalize understanding with no questions. 

## 2020-11-06 NOTE — Progress Notes (Signed)
Sudden onset central chest pain associated with rhythm changes on tele- bradycardia transiently with escape beats, increased PVCs. On exam, tachycardic, tachypneic, diaphoretic, abd distended.   Vent changed to PRVC, then PC given high vent pressures. Trop, BMP, lactate sent. Hydralazine and labetalol for HTN, fentanyl and midazolam boluses given.  EKG with sinus tach, TWI in III, no ST changes. Bedside echo with normal appearing RV. B lines in lungs. CXR without pneumothorax. KUB without free air.  Plan: Heavy sedation-- adding propofol, full vent support. Labs pending. Noncontrast CT C/A/P ordered STAT. Echocardiogram.   This patient is critically ill with multiple organ system failure which requires frequent high complexity decision making, assessment, support, evaluation, and titration of therapies. This was completed through the application of advanced monitoring technologies and extensive interpretation of multiple databases. During this encounter critical care time was devoted to patient care services described in this note for 25 minutes.   Steffanie Dunn, DO 11/06/20 10:39 AM [facility]

## 2020-11-06 NOTE — Progress Notes (Signed)
Joe, the patient's father was updated via phone. The patient's sister Mick Sell is coming to visit today.  Steffanie Dunn, DO 11/06/20 3:09 PM [facility]  From 7AM- 7PM if no response to pager, please call [phone]. After hours, 7PM- 7AM, please call Elink  [phone].

## 2020-11-06 NOTE — Progress Notes (Signed)
Zio patch placed onto patient.  All instructions and information reviewed with patient, they verbalize understanding with no questions. 

## 2020-11-06 NOTE — Progress Notes (Signed)
NAME:  Andrew [name], MRN:  [id number], DOB:  01-25-1981, LOS: 10 ADMISSION DATE:  10/26/2020,   Brief History:  40 y/o male that presented to the ER with chest pain & acute hyperactive delirium requiring mechanical ventilation.  Subsequently found to be in rhabdo with AKI  Past Medical History:  PTSD Depression  Significant Hospital Events:  3/4 extubated, AKI worse, found to be in rhabdo 3/5 Continues to have diffuse muscle pain, renal consult 3/6 increased body pain particularly right shoulder, concern for septic arthritis, MRI neg  Consults:  Renal Psych Neuro  Procedures:  ETT 3/3  >> 3/4         3/6 >> HD catheter 3/9  Significant Diagnostic Tests:  Head CT >> sphenoid sinusitis Echo 3/3 >> nml LVEF, no WMA MRI right shoulder 3/6>> no evidence of septic joint, no effusion, severe edema and muscles around shoulder joint consistent with rhabdo  Head CT 3/6 >> neg CSF cell count 3/7 >> WBC 2, RBC 2 (hypocellular) MRI brain 3/7 >> normal, paranasal sinus mucosal thickening, small mastoid effusions  Micro Data:  COVID (-) Influenza (-) resp 3/3 >> GPC, GNR >>nml flora Blood 3/3 >> negative Blood 3/6 >>NG CSF 3/7 >> Fecal lactoferrin pending   Antimicrobials:  ceftx 3/3 >> 3/6 azithro 3/3 x 1 Cefazolin 3/6 >> 3/7 Ceftriaxone 3/7 >> 3/9 vanc 3/6  X 1 dose vanco 3/7 >> 3/8  Interval events:  Had cuff leak from ETT yesterday. Having some bloody drainage around ETT cuff suction today. He denies complaints other than ongoing abdominal distention.  Objective   Blood pressure (!) 181/105, pulse 87, temperature 99.8 F (37.7 C), temperature source Axillary, resp. rate 19, height 5\' 10"  (1.778 m), weight 111.4 kg, SpO2 100 %.    Vent Mode: PSV;CPAP FiO2 (%):  [30 %] 30 % PEEP:  [5 cmH20] 5 cmH20 Pressure Support:  [5 cmH20-10 cmH20] 10 cmH20 Plateau Pressure:  [15 cmH20] 15 cmH20   Intake/Output Summary (Last 24 hours) at 11/06/2020 0851 Last data filed at  11/06/2020 0700 Gross per 24 hour  Intake 1539.82 ml  Output 3350 ml  Net -1810.18 ml   Filed Weights   11/04/20 0500 11/05/20 0500 11/06/20 0424  Weight: 114.6 kg 121.7 kg 111.4 kg    Examination: General: critically ill appearing man laying in bed in NAD HENT: /AT, eyes anicteric Lungs: breathing comfortably on PS 5/ CPAP 5. CTAB. Minimal ETT secretions.  Minimal endotracheal tube cuff leak today. Cardiovascular: S1S2, RRR Abdomen: distended, nontender Extremities: LE pitting edema, no cyanosis Neuro: RASS -3, following commands in all extremities, intact cough reflex. Gu: foley draining clear yellow urine    Resolved problems:     Assessment & Plan:   Presented with agitated delirium and rhabdo, suspecting Wellbutrin and Seroquel as culprits but surreptitious drug use being in the differential although UDS negative x2.  No evidence occult seizure activity. -Initially improved and extubated but then intubated for airway protection due to worsening somnolence on 3/6  Acute hypoxic respiratory failure requiring MV -reintubated 3/6 -Continue low tidal volume ventilation, 4 to 8 cc/kg ideal body weight with goal plateau less than 30 and driving pressure less than 15. -Daily SAT and SBT.  Would like to resolve ileus prior to extubation given concern for vomiting inability to protect airway. -VAP prevention protocol -Pad protocol for sedation.  Will give methylnaltrexone to help with likely fentanyl-induced ileus. -Goal of euvolemia -Steroids today due to concern for post extubation stridor.  ET tube cuff disconnected from suction due to concern for suction trauma.  Acute encephalopathy with hyperactive delirium; delirium controlled currently. 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 UDS and head CT negative on admit, he denies history of drug abuse, 3/5 staff notes girlfriend visiting before his  deterioration in mental status. She was noted to be unsteady, suspicious for surreptitious drug use, UDS repeated 3/6 only shows benzos and opiates which are prescribed medicines. MRI brain, CSF reassuring thus far. -Appreciate Nephrology's input- dialysis for metabolic clearance and volume removal per their recommendations. Renal function slowly improving. -Holding Wellbutrin, Seroquel -Currently on fentanyl, Precedex infusions. Titrating off as able.  Xanax available -No evidence for meningitis on CSF.  -Completed empiric antibiotics for possible infection as below -B12 supplementation  Aspiration pneumonitis without clear pneumonia sphenoid sinusitis -7-day course antibiotics completed on 3/9 -Continue to monitor off antibiotics  Leukocytosis -with diarrhea, concern for infectious diarrhea; fecal lactoferrin pending -con't to monitor off antibiotics -unfortunately anatomy not amenable for external urinary catheter due to swelling at this time; will have to continue foley for today  Hypertension, not on home agents -Continue hydralazine PRN -Continue amlodipine 10 mg once daily -increase coregdose- 12.5mg  BID  AKI , rhabdomyolysis Acute AG metabolic acidosis- resolved Hyperphosphatemia Cause unclear.  Question meds vs aggressively fighting/ resisting.  No overt seizure activity noted.  Question shoulder injury based on his complaints of pain -Appreciate Psychiatry's input. Can resume APs cautiously once CK <300 -Appreciate Nephro's management; needs to continue HD catheter for now. -Con't daily monitoring of renal function. -strcit I/Os -renally dose meds, avoid nephrotoxic meds  Bloody/coffee-ground NG aspirate- resolved -PPI daily; no ongoing signs of bleeding from GIT -Continue to follow intermittent CBC -Con't prophylactic subcutaneous heparin  Hyperglycemia -con't to monitor; has not been requiring insulin  Ileus, diarrhea -fecal lactoferrin pending -deescalate  bowel regimen -methylnaltrexone today  Anemia due to critical illness, dilution -con't to monitor -transfuse for Hb<7 or hemodynamically significant bleeding -goal of euvolemia    Best practice (evaluated daily)  Diet: TF Pain/Anxiety/Delirium protocol (if indicated): Fent gtt + precedex VAP protocol (if indicated): yes DVT prophylaxis: heparin GI prophylaxis: protonix Glucose control: SSI Mobility: bedrest Disposition:  ICU Family: father Joe updated 3/12  Goals of Care:   Code Status: Full  Labs   CBC: Recent Labs  Lab 11/02/20 0129 11/03/20 0143 11/04/20 0208 11/05/20 0311 11/06/20 0416  WBC 9.5 11.1* 12.4* 13.2* 13.9*  HGB 10.0* 9.5* 9.1* 8.5* 8.6*  HCT 29.1* 29.3* 28.0* 27.0* 27.4*  MCV 90.7 93.0 94.3 96.8 97.5  PLT 341 238 338 306 335    Basic Metabolic Panel: Recent Labs  Lab 10/31/20 0530 11/01/20 0152 11/01/20 0803 11/02/20 0129 11/03/20 0143 11/04/20 0208 11/05/20 0311 11/06/20 0416  NA 130*  --    < > 135 141 141 141 145  K 3.3*  --    < > 4.2 4.3 3.9 4.0 5.0  CL 86*  --    < > 101 105 107 107 111  CO2 22  --    < > 17* 20* 22 26 28   GLUCOSE 86  --    < > 132* 132* 154* 146* 115*  BUN 72*  --    < > 90* 93* 71* 49* 53*  CREATININE 11.69*  --    < > 11.14* 10.29* 7.16* 4.53* 3.86*  CALCIUM 6.2*  --    < > 7.0* 7.7* 8.0* 8.3* 8.7*  MG 2.2 2.2  --  2.2 2.1 2.0  --   --   PHOS 7.0* 7.0*  --  6.8* 6.9* 5.9*  --   --    < > = values in this interval not displayed.   GFR: Estimated Creatinine Clearance: 31.2 mL/min (A) (by C-G formula based on SCr of 3.86 mg/dL (H)). Recent Labs  Lab 11/02/20 0129 11/03/20 0143 11/04/20 0208 11/05/20 0311 11/06/20 0416  PROCALCITON 3.65  --  1.29 0.72 0.42  WBC 9.5 11.1* 12.4* 13.2* 13.9*  LATICACIDVEN 1.3  --   --   --   --     Liver Function Tests: Recent Labs  Lab 10/31/20 0530  ALBUMIN 2.8*   No results for input(s): LIPASE, AMYLASE in the last 168 hours. Recent Labs  Lab 10/31/20 0949   AMMONIA 37*    ABG    Component Value Date/Time   PHART 7.414 11/02/2020 0323   PCO2ART 30.1 (L) 11/02/2020 0323   PO2ART 157 (H) 11/02/2020 0323   HCO3 19.0 (L) 11/02/2020 0323   TCO2 36 (H) 10/30/2020 2104   ACIDBASEDEF 4.8 (H) 11/02/2020 0323   O2SAT 99.1 11/02/2020 0323     Coagulation Profile: No results for input(s): INR, PROTIME in the last 168 hours.  Cardiac Enzymes: Recent Labs  Lab 11/02/20 0129 11/03/20 1231 11/05/20 0034  CKTOTAL 6,322* 3,027* 1,481*    HbA1C: No results found for: HGBA1C  CBG: Recent Labs  Lab 11/05/20 1509 11/05/20 1939 11/05/20 2329 11/06/20 0328 11/06/20 0802  GLUCAP 112* 110* 109* 111* 111*    This patient is critically ill with multiple organ system failure which requires frequent high complexity decision making, assessment, support, evaluation, and titration of therapies. This was completed through the application of advanced monitoring technologies and extensive interpretation of multiple databases. During this encounter critical care time was devoted to patient care services described in this note for 33 minutes.  Steffanie Dunn, DO 11/06/20 9:10 AM  [facility]  From 7AM- 7PM if no response to pager, please call [phone]. After hours, 7PM- 7AM, please call Elink  [phone].

## 2020-11-07 ENCOUNTER — Inpatient Hospital Stay (HOSPITAL_COMMUNITY): Payer: Medicaid Other

## 2020-11-07 DIAGNOSIS — K567 Ileus, unspecified: Secondary | ICD-10-CM | POA: Diagnosis present

## 2020-11-07 DIAGNOSIS — J810 Acute pulmonary edema: Secondary | ICD-10-CM

## 2020-11-07 DIAGNOSIS — R000 Tachycardia, unspecified: Secondary | ICD-10-CM | POA: Insufficient documentation

## 2020-11-07 DIAGNOSIS — I10 Essential (primary) hypertension: Secondary | ICD-10-CM | POA: Diagnosis present

## 2020-11-07 DIAGNOSIS — J9601 Acute respiratory failure with hypoxia: Secondary | ICD-10-CM

## 2020-11-07 DIAGNOSIS — R609 Edema, unspecified: Secondary | ICD-10-CM

## 2020-11-07 DIAGNOSIS — R140 Abdominal distension (gaseous): Secondary | ICD-10-CM | POA: Insufficient documentation

## 2020-11-07 LAB — ECHOCARDIOGRAM LIMITED
Height: 70 in
Weight: 4056.46 oz

## 2020-11-07 LAB — BASIC METABOLIC PANEL
Anion gap: 14 (ref 5–15)
BUN: 61 mg/dL — ABNORMAL HIGH (ref 6–20)
CO2: 23 mmol/L (ref 22–32)
Calcium: 8.8 mg/dL — ABNORMAL LOW (ref 8.9–10.3)
Chloride: 109 mmol/L (ref 98–111)
Creatinine, Ser: 3.34 mg/dL — ABNORMAL HIGH (ref 0.61–1.24)
GFR, Estimated: 23 mL/min — ABNORMAL LOW (ref >60)
Glucose, Bld: 104 mg/dL — ABNORMAL HIGH (ref 70–99)
Potassium: 4.7 mmol/L (ref 3.5–5.1)
Sodium: 146 mmol/L — ABNORMAL HIGH (ref 135–145)

## 2020-11-07 LAB — URINALYSIS, ROUTINE W REFLEX MICROSCOPIC
Bilirubin Urine: NEGATIVE
Glucose, UA: NEGATIVE mg/dL
Ketones, ur: 20 mg/dL — AB
Leukocytes,Ua: NEGATIVE
Nitrite: NEGATIVE
Protein, ur: NEGATIVE mg/dL
Specific Gravity, Urine: 1.011 (ref 1.005–1.030)
pH: 6 (ref 5.0–8.0)

## 2020-11-07 LAB — CBC
HCT: 27.1 % — ABNORMAL LOW (ref 39.0–52.0)
Hemoglobin: 8.7 g/dL — ABNORMAL LOW (ref 13.0–17.0)
MCH: 31.1 pg (ref 26.0–34.0)
MCHC: 32.1 g/dL (ref 30.0–36.0)
MCV: 96.8 fL (ref 80.0–100.0)
Platelets: 382 10*3/uL (ref 150–400)
RBC: 2.8 MIL/uL — ABNORMAL LOW (ref 4.22–5.81)
RDW: 13.8 % (ref 11.5–15.5)
WBC: 15.1 10*3/uL — ABNORMAL HIGH (ref 4.0–10.5)
nRBC: 0 % (ref 0.0–0.2)

## 2020-11-07 LAB — GLUCOSE, CAPILLARY
Glucose-Capillary: 98 mg/dL (ref 70–99)
Glucose-Capillary: 103 mg/dL — ABNORMAL HIGH (ref 70–99)
Glucose-Capillary: 112 mg/dL — ABNORMAL HIGH (ref 70–99)
Glucose-Capillary: 104 mg/dL — ABNORMAL HIGH (ref 70–99)
Glucose-Capillary: 111 mg/dL — ABNORMAL HIGH (ref 70–99)
Glucose-Capillary: 118 mg/dL — ABNORMAL HIGH (ref 70–99)

## 2020-11-07 LAB — POTASSIUM: Potassium: 4 mmol/L (ref 3.5–5.1)

## 2020-11-07 LAB — HEPARIN LEVEL (UNFRACTIONATED): Heparin Unfractionated: 0.73 IU/mL — ABNORMAL HIGH (ref 0.30–0.70)

## 2020-11-07 LAB — TRIGLYCERIDES: Triglycerides: 230 mg/dL — ABNORMAL HIGH (ref <150)

## 2020-11-07 LAB — D-DIMER, QUANTITATIVE: D-Dimer, Quant: 11.45 ug/mL-FEU — ABNORMAL HIGH (ref 0.00–0.50)

## 2020-11-07 LAB — CK: Total CK: 540 U/L — ABNORMAL HIGH (ref 49–397)

## 2020-11-07 MED ORDER — METOPROLOL TARTRATE 5 MG/5ML IV SOLN
10.0000 mg | Freq: Four times a day (QID) | INTRAVENOUS | Status: DC
  Administered 2020-11-07 – 2020-11-08 (×3): 10 mg via INTRAVENOUS
  Filled 2020-11-07 (×3): qty 10

## 2020-11-07 MED ORDER — MIDAZOLAM HCL 2 MG/2ML IJ SOLN
2.0000 mg | Freq: Once | INTRAMUSCULAR | Status: AC
  Administered 2020-11-07: 2 mg via INTRAVENOUS

## 2020-11-07 MED ORDER — SENNA 8.6 MG PO TABS: 1.0000 | ORAL_TABLET | Freq: Once | ORAL | Status: DC

## 2020-11-07 MED ORDER — ACETAMINOPHEN 325 MG PO TABS: 650.0000 mg | ORAL_TABLET | Freq: Four times a day (QID) | ORAL | Status: DC | PRN

## 2020-11-07 MED ORDER — METOPROLOL TARTRATE 25 MG PO TABS: 25.0000 mg | ORAL_TABLET | Freq: Two times a day (BID) | ORAL | Status: DC

## 2020-11-07 MED ORDER — ESMOLOL HCL-SODIUM CHLORIDE 2000 MG/100ML IV SOLN
25.0000 ug/kg/min | INTRAVENOUS | Status: DC
  Administered 2020-11-07: 25 ug/kg/min via INTRAVENOUS
  Administered 2020-11-07 (×2): 100 ug/kg/min via INTRAVENOUS
  Administered 2020-11-08: 200 ug/kg/min via INTRAVENOUS
  Administered 2020-11-08: 225 ug/kg/min via INTRAVENOUS
  Administered 2020-11-08: 250 ug/kg/min via INTRAVENOUS
  Administered 2020-11-08: 225 ug/kg/min via INTRAVENOUS
  Administered 2020-11-08: 150 ug/kg/min via INTRAVENOUS
  Administered 2020-11-08: 125 ug/kg/min via INTRAVENOUS
  Administered 2020-11-08: 225 ug/kg/min via INTRAVENOUS
  Administered 2020-11-08: 175 ug/kg/min via INTRAVENOUS
  Administered 2020-11-08 (×2): 225 ug/kg/min via INTRAVENOUS
  Administered 2020-11-08: 200 ug/kg/min via INTRAVENOUS
  Administered 2020-11-08: 150 ug/kg/min via INTRAVENOUS
  Administered 2020-11-08: 175 ug/kg/min via INTRAVENOUS
  Administered 2020-11-08: 50 ug/kg/min via INTRAVENOUS
  Administered 2020-11-08: 200 ug/kg/min via INTRAVENOUS
  Administered 2020-11-09 (×2): 275 ug/kg/min via INTRAVENOUS
  Administered 2020-11-09: 250 ug/kg/min via INTRAVENOUS
  Administered 2020-11-09: 150 ug/kg/min via INTRAVENOUS
  Administered 2020-11-09: 225 ug/kg/min via INTRAVENOUS
  Administered 2020-11-09: 200 ug/kg/min via INTRAVENOUS
  Administered 2020-11-09: 275 ug/kg/min via INTRAVENOUS
  Administered 2020-11-09: 200 ug/kg/min via INTRAVENOUS
  Administered 2020-11-09: 225 ug/kg/min via INTRAVENOUS
  Administered 2020-11-09: 250 ug/kg/min via INTRAVENOUS
  Administered 2020-11-09: 225 ug/kg/min via INTRAVENOUS
  Administered 2020-11-09: 200 ug/kg/min via INTRAVENOUS
  Administered 2020-11-09 (×3): 250 ug/kg/min via INTRAVENOUS
  Administered 2020-11-09: 275 ug/kg/min via INTRAVENOUS
  Filled 2020-11-07 (×36): qty 100

## 2020-11-07 MED ORDER — OXYCODONE HCL 5 MG PO TABS: 5.0000 mg | ORAL_TABLET | Freq: Four times a day (QID) | ORAL | Status: DC | PRN

## 2020-11-07 MED ORDER — HEPARIN BOLUS VIA INFUSION
5000.0000 [IU] | Freq: Once | INTRAVENOUS | Status: AC
  Administered 2020-11-07: 5000 [IU] via INTRAVENOUS
  Filled 2020-11-07: qty 5000

## 2020-11-07 MED ORDER — SODIUM CHLORIDE 0.9 % IV SOLN
INTRAVENOUS | Status: DC | PRN
  Administered 2020-11-07: 250 mL via INTRAVENOUS

## 2020-11-07 MED ORDER — FUROSEMIDE 10 MG/ML IJ SOLN
60.0000 mg | Freq: Once | INTRAMUSCULAR | Status: AC
  Administered 2020-11-07: 60 mg via INTRAVENOUS
  Filled 2020-11-07: qty 6

## 2020-11-07 MED ORDER — METHYLPREDNISOLONE SODIUM SUCC 125 MG IJ SOLR
40.0000 mg | Freq: Once | INTRAMUSCULAR | Status: AC
  Administered 2020-11-07: 40 mg via INTRAVENOUS
  Filled 2020-11-07: qty 2

## 2020-11-07 MED ORDER — DEXMEDETOMIDINE HCL IN NACL 400 MCG/100ML IV SOLN
0.4000 ug/kg/h | INTRAVENOUS | Status: DC
  Administered 2020-11-07: 0.6 ug/kg/h via INTRAVENOUS
  Administered 2020-11-07 – 2020-11-08 (×3): 1.2 ug/kg/h via INTRAVENOUS
  Administered 2020-11-08: 0.8 ug/kg/h via INTRAVENOUS
  Administered 2020-11-08 (×3): 1.2 ug/kg/h via INTRAVENOUS
  Filled 2020-11-07 (×7): qty 100

## 2020-11-07 MED ORDER — METOPROLOL TARTRATE 5 MG/5ML IV SOLN
5.0000 mg | Freq: Once | INTRAVENOUS | Status: AC
  Administered 2020-11-07: 5 mg via INTRAVENOUS

## 2020-11-07 MED ORDER — HEPARIN (PORCINE) 25000 UT/250ML-% IV SOLN
1500.0000 [IU]/h | INTRAVENOUS | Status: DC
  Administered 2020-11-07: 1600 [IU]/h via INTRAVENOUS
  Administered 2020-11-08: 1500 [IU]/h via INTRAVENOUS
  Filled 2020-11-07 (×3): qty 250

## 2020-11-07 MED ORDER — MIDAZOLAM HCL 2 MG/2ML IJ SOLN
INTRAMUSCULAR | Status: AC
  Filled 2020-11-07: qty 2

## 2020-11-07 MED ORDER — SENNA 8.6 MG PO TABS
1.0000 | ORAL_TABLET | Freq: Two times a day (BID) | ORAL | Status: DC
  Administered 2020-11-07: 8.6 mg
  Filled 2020-11-07: qty 1

## 2020-11-07 MED ORDER — METOPROLOL TARTRATE 5 MG/5ML IV SOLN
5.0000 mg | Freq: Four times a day (QID) | INTRAVENOUS | Status: DC
  Administered 2020-11-07: 5 mg via INTRAVENOUS
  Filled 2020-11-07: qty 5

## 2020-11-07 MED ORDER — METOPROLOL TARTRATE 5 MG/5ML IV SOLN
INTRAVENOUS | Status: AC
  Filled 2020-11-07: qty 5

## 2020-11-07 MED ORDER — DEXMEDETOMIDINE HCL IN NACL 400 MCG/100ML IV SOLN
0.4000 ug/kg/h | INTRAVENOUS | Status: DC
  Filled 2020-11-07: qty 100

## 2020-11-07 MED ORDER — METHYLNALTREXONE BROMIDE 12 MG/0.6ML ~~LOC~~ SOLN
6.0000 mg | Freq: Once | SUBCUTANEOUS | Status: AC
  Administered 2020-11-07: 6 mg via SUBCUTANEOUS
  Filled 2020-11-07: qty 0.6

## 2020-11-07 NOTE — Progress Notes (Signed)
NAME:  Andrew [name], MRN:  [id number], DOB:  1980/12/31, LOS: 11 ADMISSION DATE:  10/26/2020,   Brief History:  40 y/o male that presented to the ER with chest pain & acute hyperactive delirium requiring mechanical ventilation.  Subsequently found to be in rhabdo with AKI  Past Medical History:  PTSD Depression  Significant Hospital Events:  3/4 extubated, AKI worse, found to be in rhabdo 3/5 Continues to have diffuse muscle pain, renal consult 3/6 increased body pain particularly right shoulder, concern for septic arthritis, MRI neg  Consults:  Nephrology Psychiatry Neurology  Procedures:  ETT 3/3  >> 3/4         3/6 >> HD catheter 3/9  Significant Diagnostic Tests:  Head CT >> sphenoid sinusitis Echo 3/3 >> nml LVEF, no WMA MRI right shoulder 3/6>> no evidence of septic joint, no effusion, severe edema and muscles around shoulder joint consistent with rhabdo  Head CT 3/6 >> neg CSF cell count 3/7 >> WBC 2, RBC 2 (hypocellular) MRI brain 3/7 >> normal, paranasal sinus mucosal thickening, small mastoid effusions  CT C/A/P 3/13> no acute findings, bilateral airspace disease with small bilateral dependent pleural effusions, small volume ascites and anasarca.  Micro Data:  COVID (-) Influenza (-) resp 3/3 >> GPC, GNR >>nml flora Blood 3/3 >> negative Blood 3/6 >>NG CSF 3/7 >> Fecal lactoferrin negative   Antimicrobials:  ceftx 3/3 >> 3/6 azithro 3/3 x 1 Cefazolin 3/6 >> 3/7 Ceftriaxone 3/7 >> 3/9 vanc 3/6  X 1 dose vanco 3/7 >> 3/8 Flagyl 3/13> 3/14 Ceftriaxone 3/13>  Interval events:  Yesterday had an episode of severe agitation, diaphoresis, brief bradycardia, without an inciting reason found. Today denies abdominal and chest pain. Febrile yesterday with Tmax 101.1, resolved since starting antibiotics.  Objective   Blood pressure (!) 167/101, pulse (!) 110, temperature 98.1 F (36.7 C), temperature source Oral, resp. rate 18, height 5\' 10"  (1.778 m),  weight 115 kg, SpO2 100 %.    Vent Mode: PCV FiO2 (%):  [30 %-40 %] 30 % Set Rate:  [15 bmp] 15 bmp PEEP:  [5 cmH20] 5 cmH20 Pressure Support:  [5 cmH20] 5 cmH20 Plateau Pressure:  [10 cmH20-25 cmH20] 23 cmH20   Intake/Output Summary (Last 24 hours) at 11/07/2020 0734 Last data filed at 11/07/2020 0600 Gross per 24 hour  Intake 2211.82 ml  Output 6250 ml  Net -4038.18 ml   Filed Weights   11/05/20 0500 11/06/20 0424 11/07/20 0356  Weight: 121.7 kg 111.4 kg 115 kg    Examination: General: ill appearing man laying in bed in NAD HENT: [place]/AT, eyes anicteric, ETT in place. Lungs: breathing comfortably on PS 10 + CPAP 5, CTAB Cardiovascular: S1S2, tachycardic, reg rate Abdomen: distended, nontender, old meds coming out through OGT suction Extremities: ++ extremity edema, no cyanosis Neuro: RASS=0, moving all extremities on commands.  Gu: foley draining clear yellow urine, still has edema Derm: dry, no rashes, multiple tattoos   Resolved problems:     Assessment & Plan:   Presented with agitated delirium and rhabdo, suspecting Wellbutrin and Seroquel as culprits but surreptitious drug use being in the differential although UDS negative x2.  No evidence occult seizure activity. -Initially improved and extubated but then intubated for airway protection due to worsening somnolence on 3/6  Acute hypoxic respiratory failure requiring MV -reintubated 3/6 Bilateral dependent pleural effusions- likely due to hypervolemia Acute pulmonary edema- likely due to hypervolemia -Con't LTVV, 4-8cc/kg IBW with goal Pplat <30 and DP<15. Cannot  tolerate PRVC due to high pressures, but has done well on PC & PS modes.  -Con't daily SAT & SBT. Needs repeat cuff leak test today. --con't diuresis- lasix 60mg  today -VAP prevention protocol -PAD protocol for sedation.  Will give another dose of methylnaltrexone to help with likely fentanyl-induced ileus.  Acute encephalopathy with hyperactive  delirium; delirium controlled currently. 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 UDS and head CT negative on admit, he denies history of drug abuse, 3/5 staff notes girlfriend visiting before his deterioration in mental status. She was noted to be unsteady, suspicious for surreptitious drug use, UDS repeated 3/6 only shows benzos and opiates which are prescribed medicines. MRI brain, CSF reassuring thus far. -Appreciate nephro's input. Due to ileus, need to con't IV antihypertensives. No plans for additional HD for now.  -Holding Wellbutrin, Seroquel -Currently on fentanyl, Precedex infusions.  Working on coming off propofol again, but did not have increase in CK thankfully. Xanax and versed available. -No evidence for meningitis on CSF.  -Completed empiric antibiotics for possible infection as below, but developed fevers off antibiotics> restarted 3/13 -B12 supplementation  Aspiration pneumonitis without clear pneumonia -7-day course antibiotics completed on 3/9 -con't ceftriaxone today, ok to stop metronidazole -UA, urine culture  Leukocytosis, fever- unknown potential source of infection -with diarrhea, concern for infectious diarrhea; fecal lactoferrin pending -con't to monitor off antibiotics -unfortunately anatomy not amenable for external urinary catheter due to swelling at this time; will have to continue foley for today  Hypertension, not on home agents -Continue hydralazine PRN -Continue amlodipine 10 mg once daily --con't scheduled IV Bblockers, will go back on coreg as soon as possible from ileus standpoint.  Sinus tachycardia -d-dimer, leg 5/9 bilaterally to evaluate for VTE; with renal dysfunction would prefer to avoid CT contrast if possible -Bblockers  AKI , rhabdomyolysis Acute AG metabolic acidosis- resolved Hyperphosphatemia Cause unclear.  Question meds vs aggressively fighting/  resisting.  No overt seizure activity noted.  Question shoulder injury based on his complaints of pain -Appreciate Psychiatry's input. Can resume APs cautiously once CK <300 -work on coming off propofol, although he did not have an increase in CK  -Appreciate Nephro's management; no plans for additional HD now -Con't daily monitoring of renal function. -strcit I/Os -renally dose meds, avoid nephrotoxic meds --diuresis to get extra volume off  Bloody/coffee-ground NG aspirate- resolved -PPI daily; no ongoing signs of bleeding from GIT -Continue to follow intermittent CBC -Con't prophylactic subcutaneous heparin  Hyperglycemia -con't to monitor; has not been requiring insulin  Ileus, non-inflammatory diarrhea (negative fecal lactoferrin) -con't reglan Q6h -methylnaltrexone again today  Anemia due to critical illness, dilution -con't to monitor -transfuse for Hb<7 or hemodynamically significant bleeding -goal of euvolemia    Best practice (evaluated daily)  Diet: TF Pain/Anxiety/Delirium protocol (if indicated): Fent gtt + precedex, midazolam, propofol VAP protocol (if indicated): yes DVT prophylaxis: heparin GI prophylaxis: protonix Glucose control: SSI Mobility: bedrest Disposition:  ICU Family: father Joe updated 3/13  Goals of Care:   Code Status: Full  Labs   CBC: Recent Labs  Lab 11/03/20 0143 11/04/20 0208 11/05/20 0311 11/06/20 0416 11/07/20 0140  WBC 11.1* 12.4* 13.2* 13.9* 15.1*  HGB 9.5* 9.1* 8.5* 8.6* 8.7*  HCT 29.3* 28.0* 27.0* 27.4* 27.1*  MCV 93.0 94.3 96.8 97.5 96.8  PLT 238 338 306 335 382    Basic Metabolic Panel: Recent Labs  Lab 11/01/20 0152 11/01/20 0803 11/02/20 0129 11/03/20  7616 11/04/20 0208 11/05/20 0311 11/06/20 0416 11/06/20 1024 11/07/20 0140  NA  --    < > 135 141 141 141 145 144 146*  K  --    < > 4.2 4.3 3.9 4.0 5.0 5.3* 4.7  CL  --    < > 101 105 107 107 111 109 109  CO2  --    < > 17* 20* 22 26 28 24 23    GLUCOSE  --    < > 132* 132* 154* 146* 115* 138* 104*  BUN  --    < > 90* 93* 71* 49* 53* 58* 61*  CREATININE  --    < > 11.14* 10.29* 7.16* 4.53* 3.86* 3.66* 3.34*  CALCIUM  --    < > 7.0* 7.7* 8.0* 8.3* 8.7* 9.0 8.8*  MG 2.2  --  2.2 2.1 2.0  --   --   --   --   PHOS 7.0*  --  6.8* 6.9* 5.9*  --   --   --   --    < > = values in this interval not displayed.   GFR: Estimated Creatinine Clearance: 36.6 mL/min (A) (by C-G formula based on SCr of 3.34 mg/dL (H)). Recent Labs  Lab 11/02/20 0129 11/03/20 0143 11/04/20 0208 11/05/20 0311 11/06/20 0416 11/06/20 1036 11/06/20 1257 11/07/20 0140  PROCALCITON 3.65  --  1.29 0.72 0.42  --   --   --   WBC 9.5   < > 12.4* 13.2* 13.9*  --   --  15.1*  LATICACIDVEN 1.3  --   --   --   --  1.5 1.1  --    < > = values in this interval not displayed.    Liver Function Tests: No results for input(s): AST, ALT, ALKPHOS, BILITOT, PROT, ALBUMIN in the last 168 hours. No results for input(s): LIPASE, AMYLASE in the last 168 hours. Recent Labs  Lab 10/31/20 0949  AMMONIA 37*    ABG    Component Value Date/Time   PHART 7.414 11/02/2020 0323   PCO2ART 30.1 (L) 11/02/2020 0323   PO2ART 157 (H) 11/02/2020 0323   HCO3 19.0 (L) 11/02/2020 0323   TCO2 36 (H) 10/30/2020 2104   ACIDBASEDEF 4.8 (H) 11/02/2020 0323   O2SAT 99.1 11/02/2020 0323     Coagulation Profile: No results for input(s): INR, PROTIME in the last 168 hours.  Cardiac Enzymes: Recent Labs  Lab 11/02/20 0129 11/03/20 1231 11/05/20 0034 11/07/20 0140  CKTOTAL 6,322* 3,027* 1,481* 540*    HbA1C: No results found for: HGBA1C  CBG: Recent Labs  Lab 11/06/20 1238 11/06/20 1507 11/06/20 1926 11/06/20 2320 11/07/20 0323  GLUCAP 115* 117* 90 88 98    This patient is critically ill with multiple organ system failure which requires frequent high complexity decision making, assessment, support, evaluation, and titration of therapies. This was completed through the  application of advanced monitoring technologies and extensive interpretation of multiple databases. During this encounter critical care time was devoted to patient care services described in this note for 45 minutes.  11/09/20, DO 11/07/20 9:17 AM  [facility]  From 7AM- 7PM if no response to pager, please call [phone]. After hours, 7PM- 7AM, please call Elink  [phone].

## 2020-11-07 NOTE — Progress Notes (Signed)
Physical Therapy Discharge Patient Details Name: Andrew [name] MRN: [id number] DOB: [birth year] Today's Date: 11/07/2020 Time:  -     Patient discharged from PT services secondary to MD discontinued PT order; pt weaning off vent ?today. Please re-order PT when appropriate.   Please see latest therapy progress note for current level of functioning and progress toward goals.    Progress and discharge plan discussed with patient and/or caregiver: Patient unable to participate in discharge planning and no caregivers available  GP      Jerolyn Center, PT Pager [phone]   Zena Amos 11/07/2020, 11:21 AM

## 2020-11-07 NOTE — Progress Notes (Addendum)
ANTICOAGULATION CONSULT NOTE - Initial Consult  Pharmacy Consult for heparin dosing Indication: VTE suspicion   No Known Allergies  Patient Measurements: Height: 5\' 10"  (177.8 cm) Weight: 115 kg (253 lb 8.5 oz) IBW/kg (Calculated) : 73 Heparin Dosing Weight: 98 kg (TBW >125% IBW)  Vital Signs: Temp: 99.7 F (37.6 C) (03/14 0740) Temp Source: Oral (03/14 0740) BP: 184/107 (03/14 1030) Pulse Rate: 130 (03/14 1030)  Labs: Recent Labs    11/05/20 0034 11/05/20 0311 11/05/20 0311 11/06/20 0416 11/06/20 1010 11/06/20 1024 11/06/20 1635 11/07/20 0140  HGB  --  8.5*   < > 8.6*  --   --   --  8.7*  HCT  --  27.0*  --  27.4*  --   --   --  27.1*  PLT  --  306  --  335  --   --   --  382  CREATININE  --  4.53*   < > 3.86*  --  3.66*  --  3.34*  CKTOTAL 1,481*  --   --   --   --   --   --  540*  TROPONINIHS  --   --   --   --  20*  --  24*  --    < > = values in this interval not displayed.    Estimated Creatinine Clearance: 36.6 mL/min (A) (by C-G formula based on SCr of 3.34 mg/dL (H)).   Medical History: History reviewed. No pertinent past medical history.  Medications:  SQ heparin 5,000 units Q8H 3/4 >> 3/14  Assessment: 40 y.o. M presented to ER 10/26/20 with chest pain and acute hyperactive delerium requiring mechanical ventilation. Patient was found to be in rhabdo. Sudden chest pain associated with tachycardia, tachypnea, diaphoresis noted on 11/06/20, suggestive of potential VTE. D-Dimer elevated at 11.45 in setting of rhabdo and AKI, lower extremity ultrasound pending.   Goal of Therapy:  Heparin level: 0.3-0.7  Monitor platelets by anticoagulation protocol; yes  Plan:   Initiate heparin bolus with 5,000 units IV   Initiate heparin gtt 1600 units/hr   Follow-up 6 hour heparin level  Monitor Hgb and Plts    11/08/20  PharmD Candidate, Class of 2022  2023 11/07/2020,10:57 AM

## 2020-11-07 NOTE — Progress Notes (Signed)
OT Cancellation Note  Patient Details Name: Andrew [name] MRN: [id number] DOB: February 19, 1981   Cancelled Treatment:    Reason Eval/Treat Not Completed: Patient not medically ready (Pt weaning from vent now with high HR and is not due for therapy until after extubation trial. OT to continue to follow with new OT orders placed for OT re-eval.)   Flora Lipps, OTR/L Acute Rehabilitation Services Pager: [phone] Office: [phone]   Seger Jani C 11/07/2020, 12:33 PM

## 2020-11-07 NOTE — Progress Notes (Signed)
S-   Called to bedside to reassess for patient concerns of dizziness. Patient denies chest pain and SOB. Reports BLLE pain.   O-   Blood pressure (!) 186/101, pulse (!) 128, temperature (!) 101 F (38.3 C), temperature source Oral, resp. rate 17, height 5\' 10"  (1.778 m), weight 115 kg, SpO2 100 %.  General- Pt resting comfortably in bed, no signs of acute distress CV- Sinus tach on monitor, hypertensive Skin- Warm, Dry  Respiratory- Patient on room air, breathing unlabored, expectorating clear secretions  A-   HTN Emergency AKI Rhabdomylisis  DVT- Presumed PE d/t tachycardia and BLLE + DVT  P-   Esmolol GTT. Cont Heparin Drip.  Await formal read of formal ECHO. No evidence of RV strain at bedside. If hemodynamics become unstable consider TPA.   ., MSN, APRN, AGACNP-BC [facility]  11/07/2020 , 3:41 PM   Please see [email or web address] for pager details  From 7a-7p if no response, please call [phone] After hours, please call Elink at [phone]

## 2020-11-07 NOTE — CV Procedure (Signed)
BLE venous duplex completed. Preliminary findings given to April, RN and [name], NP at 1230.  Results can be found under chart review under CV PROC. 11/07/2020 2:19 PM Ardra Kuznicki RVT, RDMS

## 2020-11-07 NOTE — Progress Notes (Signed)
Patient was attempting bedside swallow evaluation by this RN. Patient tolerated 1 ice cube without coughing or complication. This RN then gave 1 small sip of plain water via straw to which the patient's HR dropped into the 40's, RR increased to the 40's and patient became unresponsive for approximately 10 seconds. Patient was then complaining of blurry vision, which he has had since admission (per patient) and tingling feeling everywhere. MD was paged to which she gave verbal order to keep NPO overnight. After the event the patient became oriented once awakening and was able to speak and move all extremities, pupils equal and reactive   Jayme Cloud 11/07/20 1735

## 2020-11-07 NOTE — Progress Notes (Signed)
ANTICOAGULATION CONSULT NOTE - Follow-Up Consult  Pharmacy Consult for IV Heparin Indication:  DVY  No Known Allergies  Patient Measurements: Height: 5\' 10"  (177.8 cm) Weight: 115 kg (253 lb 8.5 oz) IBW/kg (Calculated) : 73 Heparin Dosing Weight: 98 kg   Vital Signs: Temp: 98.3 F (36.8 C) (03/14 1928) Temp Source: Oral (03/14 1928) BP: 167/87 (03/14 2000) Pulse Rate: 108 (03/14 2000)  Labs: Recent Labs    11/05/20 0034 11/05/20 0311 11/05/20 0311 11/06/20 0416 11/06/20 1010 11/06/20 1024 11/06/20 1635 11/07/20 0140 11/07/20 1942  HGB  --  8.5*   < > 8.6*  --   --   --  8.7*  --   HCT  --  27.0*  --  27.4*  --   --   --  27.1*  --   PLT  --  306  --  335  --   --   --  382  --   HEPARINUNFRC  --   --   --   --   --   --   --   --  0.73*  CREATININE  --  4.53*   < > 3.86*  --  3.66*  --  3.34*  --   CKTOTAL 1,481*  --   --   --   --   --   --  540*  --   TROPONINIHS  --   --   --   --  20*  --  24*  --   --    < > = values in this interval not displayed.    Estimated Creatinine Clearance: 36.6 mL/min (A) (by C-G formula based on SCr of 3.34 mg/dL (H)).  Assessment: 40 yr old male presented to ER 10/26/20 with chest pain and acute hyperactive delirium, requiring mechanical ventilation. Patient was found to have rhabdomyolysis. Pt also with sudden chest pain associated with tachycardia, tachypnea, diaphoresis noted on 11/06/20, suggestive of potential VTE (also with BLE pain). D-Dimer was elevated at 11.45 in setting of rhabdo and AKI. Vascular U/S showed findings consistent with acute DVT in both RLE and LLE. ECHO: no evidence of RHS or enlargement to suggest large PE, alternative imaging for PE suggested.  Pharmacy was consulted to dose IV heparin. Pt had rec'd heparin 5000 units SQ Q 8 hrs from 3/4-3/14.  Initial heparin level ~8 hrs after heparin 5000 units IV bolus X 1, followed by heparin 1600 units/hr, was 0.73 units/ml, which is just above the goal range for this  pt. H/H 8.7/27.1, plt 382 (CBC stable). Per RN, no issues with IV or bleeding observed.  Goal of Therapy:  Heparin level: 0.3-0.7 units/ml Monitor platelets by anticoagulation protocol; yes  Plan:  Decrease heparin infusion to 1500 units/hr Check heparin level in 6 hrs Monitor daily heparin level, CBC Monitor for bleeding  08-07-1976, PharmD, BCPS, [facility] Clinical Pharmacist 11/07/2020,9:07 PM

## 2020-11-07 NOTE — Progress Notes (Addendum)
NAME:  Andrew [name], MRN:  [id number], DOB:  Jan 05, 1981, LOS: 11 ADMISSION DATE:  10/26/2020,   Brief History:  40 y/o male admitted 3/2 with chest pain & acute hyperactive delirium requiring mechanical ventilation.  Subsequently found to be in rhabdo with AKI requiring HD.  Concern Wellbutrin, Seroquel contributing.  UDS negative x2, CSF negative, MRI negative. Failed extubation 3/4, re-intubated 3/6.   Past Medical History:  PTSD Depression  Significant Hospital Events:  3/04 extubated, AKI worse, found to be in rhabdo 3/05 Continues to have diffuse muscle pain, renal consult 3/06 increased body pain particularly right shoulder, concern for septic arthritis, MRI neg 3/13 Abd distention, cuff leak, bloody drainage around ETT  3/14 Weaning on PSV, remains on high dose sedation  Consults:  Renal Psych Neuro  Procedures:  ETT 3/3  >> 3/4 ETT 3/6 >> HD catheter 3/9 >>  Significant Diagnostic Tests:   Head CT 3/3 >> sphenoid sinusitis  Echo 3/3 >> nml LVEF, no WMA  MRI right shoulder 3/6 >> no evidence of septic joint, no effusion, severe edema and muscles around shoulder joint consistent with rhabdo  Head CT 3/6 >> neg  CSF cell count 3/7 >> WBC 2, RBC 2 (hypocellular)  MRI brain 3/7 >> normal, paranasal sinus mucosal thickening, small mastoid effusions  Micro Data:  COVID 3/3 >> negative Influenza 3/3 >> negative Resp 3/3 >> nml flora Blood 3/3 >> negative Blood 3/6 >> negative CSF 3/7 >> negative  Fecal lactoferrin 3/11 >> negative UA 3/14 >>  Antimicrobials:  Ceftx 3/3 >> 3/6 Azithro 3/3 x 1 Cefazolin 3/6 >> 3/7 Ceftriaxone 3/7 >> 3/9 Vanc 3/6  X 1 dose Vanco 3/7 >> 3/8 Ceftriaxone 3/13 >>  Interval events:  Tmax 99.7 / WBC rising, 15.1  I/O 5.4L UOP, emesis, stool, -4.3L in last 24 hours Weaning on PSV 5/5, 30% Glucose range 88-104 RN reports difficult to control BP. No emesis.  Objective   Blood pressure (!) 175/104, pulse (!)  118, temperature 99.7 F (37.6 C), temperature source Oral, resp. rate 18, height 5\' 10"  (1.778 m), weight 115 kg, SpO2 100 %.    Vent Mode: CPAP;PSV FiO2 (%):  [30 %-40 %] 30 % Set Rate:  [15 bmp] 15 bmp PEEP:  [5 cmH20] 5 cmH20 Pressure Support:  [10 cmH20] 10 cmH20 Plateau Pressure:  [23 cmH20-25 cmH20] 23 cmH20   Intake/Output Summary (Last 24 hours) at 11/07/2020 0917 Last data filed at 11/07/2020 0825 Gross per 24 hour  Intake 2069.37 ml  Output 7250 ml  Net -5180.63 ml   Filed Weights   11/05/20 0500 11/06/20 0424 11/07/20 0356  Weight: 121.7 kg 111.4 kg 115 kg    Examination: General: Awake, intubated, in bed, no acute distress HENT: ETT, OGT, Pink moist, pupils equal, anicteric Neuro: RASS 0, GCS 11T, MAE Pulm: Intubated, Non-labored on PSV 5/5 30%fio2 CV: Sinus Tachycardia, S1S2, no murmurs appreciated, BLE +1 pretibial edema Abdomen: distended, hypoactive bowel sounds, flexiseal fecal management system in place with brown stool GU: Foley in place, clear yellow urine draining Skin: Warm, no rashes or lesions    Resolved problems:  Acute AG metabolic acidosis Bloody/coffee-ground NG aspirate     Assessment & Plan:    Acute Hypoxic Respiratory Failure requiring Mechanical Ventilation  In setting of agitated delirium, unclear cause.  Failed extubation 3/4, reintubated 3/6. -daily PSV wean with WUA >> goal for extubation 3/14 - follow intermittent CXR  -Aspiration risk noted with ileus, aspiration precautions/monitor closely -  VAP prevention protocol -PAD protocol for RASS goal 0 to -1 if remains intubated -Hopeful that narcotic cessation will improve ileus -Goal of euvolemia -Steroids today due to concern for post extubation stridor.  -60 mg lasix given with goal for euvolemia   Acute Encephalopathy with hyperactive delirium; delirium controlled currently. 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 UDS and head CT negative on admit, denies history of drug abuse, 3/5 staff notes girlfriend visiting before his deterioration in mental status. She was noted to be unsteady, suspicious for surreptitious drug use, UDS repeated 3/6 only shows benzos and opiates which are prescribed medicines. MRI brain, CSF reassuring thus far. -hold home wellbutrin, seroquel  -PAD protocol as above -B12 supplementation   - Delirium prevention   Aspiration Pneumonitis without clear pneumonia Sphenoid sinusitis Completed 7-day course antibiotics completed on 3/9.   -monitor PCXR  Leukocytosis/Fever -Follow fever curve/WBC trend - Assess UA -Continue rocephin, stop if UA negative - Continue foley due to swelling   -Hypertension, not on home agents -Lopressor IV 10mg  q6h  -Hydralazine PRN for systolic greater than 160 -d/c amlodipine and core due to ileus  Tachycardia, elevated d-dimer -F/U BLLE dopplers -Initiated empiric heparin drip   AKI , rhabdomyolysis Hypernatremoia Hyperphosphatemia Cause unclear.  Question meds vs aggressively fighting/ resisting.  No overt seizure activity noted.  Question shoulder injury based on his complaints of pain -Appreciate psychiatry's input, will consider resumption of AP once CK <300 -Appreciate Nephro's management; per team can switch HD cath to CVC if needed -Trend BMP / urinary output -Replace electrolytes as indicated -Avoid nephrotoxic agents, ensure adequate renal perfusion  Hyperglycemia - CBG goal 160-180, well controlled, cont to monitor  Ileus, diarrhea -fecal lactoferrin negative -continue reglan -follow abdomal exam -hoping cessation of narcotic infusion and mobilization will improve ileus  Anemia due to critical illness, dilution -will trend cbc -transfuse for HGB<7 -  Best practice (evaluated daily)  Diet: TF Pain/Anxiety/Delirium protocol (if indicated): PAD protocol  VAP protocol (if indicated): yes DVT  prophylaxis: heparin GI prophylaxis: protonix Glucose control: SSI Mobility: bedrest Disposition: ICU Family: father Joe updated 3/14  Goals of Care:   Code Status: Full Code   Labs   CBC: Recent Labs  Lab 11/03/20 0143 11/04/20 0208 11/05/20 0311 11/06/20 0416 11/07/20 0140  WBC 11.1* 12.4* 13.2* 13.9* 15.1*  HGB 9.5* 9.1* 8.5* 8.6* 8.7*  HCT 29.3* 28.0* 27.0* 27.4* 27.1*  MCV 93.0 94.3 96.8 97.5 96.8  PLT 238 338 306 335 382    Basic Metabolic Panel: Recent Labs  Lab 11/01/20 0152 11/01/20 0803 11/02/20 0129 11/03/20 0143 11/04/20 0208 11/05/20 0311 11/06/20 0416 11/06/20 1024 11/07/20 0140  NA  --    < > 135 141 141 141 145 144 146*  K  --    < > 4.2 4.3 3.9 4.0 5.0 5.3* 4.7  CL  --    < > 101 105 107 107 111 109 109  CO2  --    < > 17* 20* 22 26 28 24 23   GLUCOSE  --    < > 132* 132* 154* 146* 115* 138* 104*  BUN  --    < > 90* 93* 71* 49* 53* 58* 61*  CREATININE  --    < > 11.14* 10.29* 7.16* 4.53* 3.86* 3.66* 3.34*  CALCIUM  --    < > 7.0* 7.7* 8.0* 8.3* 8.7* 9.0 8.8*  MG 2.2  --  2.2 2.1  2.0  --   --   --   --   PHOS 7.0*  --  6.8* 6.9* 5.9*  --   --   --   --    < > = values in this interval not displayed.   GFR: Estimated Creatinine Clearance: 36.6 mL/min (A) (by C-G formula based on SCr of 3.34 mg/dL (H)). Recent Labs  Lab 11/02/20 0129 11/03/20 0143 11/04/20 0208 11/05/20 0311 11/06/20 0416 11/06/20 1036 11/06/20 1257 11/07/20 0140  PROCALCITON 3.65  --  1.29 0.72 0.42  --   --   --   WBC 9.5   < > 12.4* 13.2* 13.9*  --   --  15.1*  LATICACIDVEN 1.3  --   --   --   --  1.5 1.1  --    < > = values in this interval not displayed.    Liver Function Tests: No results for input(s): AST, ALT, ALKPHOS, BILITOT, PROT, ALBUMIN in the last 168 hours. No results for input(s): LIPASE, AMYLASE in the last 168 hours. Recent Labs  Lab 10/31/20 0949  AMMONIA 37*    ABG    Component Value Date/Time   PHART 7.414 11/02/2020 0323   PCO2ART  30.1 (L) 11/02/2020 0323   PO2ART 157 (H) 11/02/2020 0323   HCO3 19.0 (L) 11/02/2020 0323   TCO2 36 (H) 10/30/2020 2104   ACIDBASEDEF 4.8 (H) 11/02/2020 0323   O2SAT 99.1 11/02/2020 0323     Coagulation Profile: No results for input(s): INR, PROTIME in the last 168 hours.  Cardiac Enzymes: Recent Labs  Lab 11/02/20 0129 11/03/20 1231 11/05/20 0034 11/07/20 0140  CKTOTAL 6,322* 3,027* 1,481* 540*    HbA1C: No results found for: HGBA1C  CBG: Recent Labs  Lab 11/06/20 1507 11/06/20 1926 11/06/20 2320 11/07/20 0323 11/07/20 0738  GLUCAP 117* 90 88 98 103*    Critical Care Time: 35 minutes   Gershon Mussel., MSN, APRN, AGACNP-BC [facility]  11/07/2020 , 9:18 AM   Please see [email or web address] for pager details  From 7a-7p if no response, please call [phone] After hours, please call Elink at [phone]

## 2020-11-07 NOTE — Progress Notes (Signed)
  Echocardiogram 2D Echocardiogram has been performed.  Gerda Diss 11/07/2020, 3:42 PM

## 2020-11-07 NOTE — Progress Notes (Addendum)
Patient ID: Andrew [name], male   DOB: 1980-12-04, 40 y.o.   MRN: [id number] [facility] Progress Note   Assessment/ Plan:   1. Acute kidney Injury: Secondary to rhabdomyolysis/pigment nephropathy; polyuric overnight status post furosemide and with improving renal function off of dialysis.  CPK level continues to trend down.  He does not have any indications for dialysis at this time and will keep his dialysis catheter in place as a source for central intravenous access via the pigtail (CCM opts to switch this out for a non-HD catheter central line). 2.  Hypertension: Blood pressures remain elevated on amlodipine 10 mg daily, status post a single dose of carvedilol 6.25 mg yesterday with need for as needed hydralazine, metoprolol and labetalol.  Given the presence of his tachycardia, will start him on scheduled metoprolol. 3.  Ventilator dependent respiratory failure: Secondary to generalized weakness/acute hyperactive delirium. 4.  Chest pain: Developed sudden onset central chest pain yesterday with tachycardia/increased PVCs along with tachypnea and diaphoresis for which he underwent imaging without any evidence of pneumothorax or intra-abdominal process. 5.  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   Objective:   BP (!) 167/101   Pulse (!) 110   Temp 98.1 F (36.7 C) (Oral)   Resp 18   Ht 5' 10"  (1.778 m)   Wt 115 kg   SpO2 100%   BMI 36.38 kg/m   Intake/Output Summary (Last 24 hours) at 11/07/2020 3474 Last data filed at 11/07/2020 0600 Gross per 24 hour  Intake 2211.82 ml  Output 6250 ml  Net -4038.18 ml   Weight change: 3.6 kg  Physical Exam: Gen: Appears comfortable on ventilator-on sedation CVS: Pulse regular tachycardia, S1 and S2 normal Resp: Anteriorly clear to auscultation, no rales/rhonchi Abd: Soft, obese,  nontender, bowel sounds normal Ext: Trace-1+ lower extremity edema edema.  Imaging: DG Abd 1 View  Result Date: 11/06/2020 CLINICAL DATA:  Abdominal distension EXAM: ABDOMEN - 1 VIEW COMPARISON:  November 05, 2020 FINDINGS: Nasogastric tube tip and side port in stomach. No bowel dilatation or air-fluid level to suggest bowel obstruction. No free air. No abnormal calcifications. IMPRESSION: Nasogastric tube tip and side port in stomach. No bowel dilatation. No findings indicative of bowel obstruction or free air on supine examination. Electronically Signed   By: Lowella Grip III M.D.   On: 11/06/2020 10:47   DG Abd 1 View  Result Date: 11/05/2020 CLINICAL DATA:  Abdominal distension EXAM: ABDOMEN - 1 VIEW COMPARISON:  11/03/2020 FINDINGS: Enteric tube terminates within the gastric body. Persistent air-filled loops of small and large bowel within the abdomen, overall decreased from recent comparison study. No gross free intraperitoneal air on AP supine view. IMPRESSION: Persistent air-filled loops of small and large bowel within the abdomen, overall decreased from recent comparison study. Findings suggestive of ileus or enteritis rather than obstruction. Continued radiographic follow-up recommended. Electronically Signed   By: Davina Poke D.O.   On: 11/05/2020 12:49   DG CHEST PORT 1 VIEW  Result Date: 11/06/2020 CLINICAL DATA:  Chest pain.  Hypoxia. EXAM: PORTABLE CHEST 1 VIEW COMPARISON:  November 04, 2020 FINDINGS: Endotracheal tube tip is 4.2 cm above the carina. Nasogastric tube tip and side port are below the diaphragm. Central catheter tip is in the superior vena cava. No pneumothorax. Lungs are clear. Heart is upper normal in size with pulmonary vascularity normal. No adenopathy.  No bone lesions. IMPRESSION: Tube and catheter positions as described without evident pneumothorax. No edema or airspace opacity. Stable cardiac silhouette. Electronically Signed   By: Lowella Grip III M.D.    On: 11/06/2020 10:46   CT CHEST ABDOMEN PELVIS WO CONTRAST  Result Date: 11/06/2020 CLINICAL DATA:  Sudden onset chest and abdominal pain EXAM: CT CHEST, ABDOMEN AND PELVIS WITHOUT CONTRAST TECHNIQUE: Multidetector CT imaging of the chest, abdomen and pelvis was performed following the standard protocol without IV contrast. COMPARISON:  None. FINDINGS: CT CHEST FINDINGS Cardiovascular: No significant vascular findings. Normal heart size. No pericardial effusion. Mediastinum/Nodes: No enlarged mediastinal, hilar, or axillary lymph nodes. Thyroid gland, trachea, and esophagus demonstrate no significant findings. Lungs/Pleura: Endotracheal intubation. Predominantly dependent heterogeneous and consolidative airspace opacity throughout the lungs. Small bilateral pleural effusions. Interlobular septal thickening. Musculoskeletal: No chest wall mass or suspicious bone lesions identified. CT ABDOMEN PELVIS FINDINGS Hepatobiliary: No solid liver abnormality is seen. No gallstones, gallbladder wall thickening, or biliary dilatation. Pancreas: Unremarkable. No pancreatic ductal dilatation or surrounding inflammatory changes. Spleen: Normal in size without significant abnormality. Adrenals/Urinary Tract: Adrenal glands are unremarkable. Kidneys are normal, without renal calculi, solid lesion, or hydronephrosis. Bladder is unremarkable. Stomach/Bowel: Stomach is within normal limits. Appendix appears normal. No evidence of bowel wall thickening, distention, or inflammatory changes. Rectal tube in position. Vascular/Lymphatic: No significant vascular findings are present. No enlarged abdominal or pelvic lymph nodes. Reproductive: No mass or other abnormality. Other: Anasarca.  Small volume ascites. Musculoskeletal: No acute or significant osseous findings. IMPRESSION: 1. Predominantly dependent heterogeneous and consolidative airspace opacity throughout the lungs, with small bilateral pleural effusions. Interlobular septal  thickening. Findings are consistent with multifocal infection, edema, and/or ARDS. 2. Endotracheal intubation. 3. Small volume ascites and anasarca. 4. No non-contrast CT findings of the abdomen or pelvis to explain abdominal pain. Electronically Signed   By: Eddie Candle M.D.   On: 11/06/2020 12:14    Labs: BMET Recent Labs  Lab 11/01/20 0152 11/01/20 0803 11/02/20 0129 11/03/20 0143 11/04/20 0208 11/05/20 0311 11/06/20 0416 11/06/20 1024 11/07/20 0140  NA  --    < > 135 141 141 141 145 144 146*  K  --    < > 4.2 4.3 3.9 4.0 5.0 5.3* 4.7  CL  --    < > 101 105 107 107 111 109 109  CO2  --    < > 17* 20* 22 26 28 24 23   GLUCOSE  --    < > 132* 132* 154* 146* 115* 138* 104*  BUN  --    < > 90* 93* 71* 49* 53* 58* 61*  CREATININE  --    < > 11.14* 10.29* 7.16* 4.53* 3.86* 3.66* 3.34*  CALCIUM  --    < > 7.0* 7.7* 8.0* 8.3* 8.7* 9.0 8.8*  PHOS 7.0*  --  6.8* 6.9* 5.9*  --   --   --   --    < > = values in this interval not displayed.   CBC Recent Labs  Lab 11/04/20 0208 11/05/20 0311 11/06/20 0416 11/07/20 0140  WBC 12.4* 13.2* 13.9* 15.1*  HGB 9.1* 8.5* 8.6* 8.7*  HCT 28.0* 27.0* 27.4* 27.1*  MCV 94.3 96.8 97.5 96.8  PLT 338 306 335 382    Medications:    . amLODipine  10 mg Per Tube Daily  . chlorhexidine gluconate (MEDLINE KIT)  15 mL Mouth Rinse BID  . Chlorhexidine Gluconate Cloth  6 each Topical Q0600  .  Chlorhexidine Gluconate Cloth  6 each Topical Q0600  . feeding supplement (PROSource TF)  45 mL Per Tube QID  . heparin injection (subcutaneous)  5,000 Units Subcutaneous Q8H  . mouth rinse  15 mL Mouth Rinse 10 times per day  . metoCLOPramide (REGLAN) injection  5 mg Intravenous Q8H  . metoprolol tartrate  5 mg Intravenous Q6H  . multivitamin  15 mL Per Tube Daily  . pantoprazole sodium  40 mg Per Tube Daily  . sodium chloride flush  10-40 mL Intracatheter Q12H  . vitamin B-12  1,000 mcg Per Tube Daily   Elmarie Shiley, MD 11/07/2020, 7:02 AM

## 2020-11-07 NOTE — Procedures (Signed)
Extubation Procedure Note  Patient Details:   Name: Andrew [name] DOB: 1981/03/21 MRN: [id number]   Airway Documentation:    Vent end date: 11/07/20 Vent end time: 1315   Evaluation  O2 sats: stable throughout Complications: No apparent complications Patient did tolerate procedure well. Bilateral Breath Sounds: Diminished (Coarse)   Yes   RT extubated patient to 4L nasal cannula Per MD order with RN at bedside. Positive cuff leak noted. NP bedside post extubation. No stridor noted at this time. RT will continue to monitor as needed.  Jaquelyn Bitter 11/07/2020, 1:31 PM

## 2020-11-08 ENCOUNTER — Inpatient Hospital Stay (HOSPITAL_COMMUNITY): Payer: Medicaid Other

## 2020-11-08 LAB — BASIC METABOLIC PANEL
Anion gap: 10 (ref 5–15)
BUN: 59 mg/dL — ABNORMAL HIGH (ref 6–20)
CO2: 28 mmol/L (ref 22–32)
Calcium: 8.9 mg/dL (ref 8.9–10.3)
Chloride: 112 mmol/L — ABNORMAL HIGH (ref 98–111)
Creatinine, Ser: 2.79 mg/dL — ABNORMAL HIGH (ref 0.61–1.24)
GFR, Estimated: 28 mL/min — ABNORMAL LOW (ref >60)
Glucose, Bld: 128 mg/dL — ABNORMAL HIGH (ref 70–99)
Potassium: 4 mmol/L (ref 3.5–5.1)
Sodium: 150 mmol/L — ABNORMAL HIGH (ref 135–145)

## 2020-11-08 LAB — POCT I-STAT 7, (LYTES, BLD GAS, ICA,H+H)
Acid-Base Excess: 6 mmol/L — ABNORMAL HIGH (ref 0.0–2.0)
Bicarbonate: 29.4 mmol/L — ABNORMAL HIGH (ref 20.0–28.0)
Calcium, Ion: 1.22 mmol/L (ref 1.15–1.40)
HCT: 26 % — ABNORMAL LOW (ref 39.0–52.0)
Hemoglobin: 8.8 g/dL — ABNORMAL LOW (ref 13.0–17.0)
O2 Saturation: 96 %
Patient temperature: 98.3
Potassium: 4 mmol/L (ref 3.5–5.1)
Sodium: 154 mmol/L — ABNORMAL HIGH (ref 135–145)
TCO2: 31 mmol/L (ref 22–32)
pCO2 arterial: 36.9 mmHg (ref 32.0–48.0)
pH, Arterial: 7.51 — ABNORMAL HIGH (ref 7.350–7.450)
pO2, Arterial: 71 mmHg — ABNORMAL LOW (ref 83.0–108.0)

## 2020-11-08 LAB — GLUCOSE, CAPILLARY
Glucose-Capillary: 104 mg/dL — ABNORMAL HIGH (ref 70–99)
Glucose-Capillary: 110 mg/dL — ABNORMAL HIGH (ref 70–99)
Glucose-Capillary: 111 mg/dL — ABNORMAL HIGH (ref 70–99)
Glucose-Capillary: 93 mg/dL (ref 70–99)
Glucose-Capillary: 96 mg/dL (ref 70–99)

## 2020-11-08 LAB — HEPARIN LEVEL (UNFRACTIONATED): Heparin Unfractionated: 0.66 IU/mL (ref 0.30–0.70)

## 2020-11-08 LAB — CBC
HCT: 27.5 % — ABNORMAL LOW (ref 39.0–52.0)
Hemoglobin: 8.6 g/dL — ABNORMAL LOW (ref 13.0–17.0)
MCH: 30.5 pg (ref 26.0–34.0)
MCHC: 31.3 g/dL (ref 30.0–36.0)
MCV: 97.5 fL (ref 80.0–100.0)
Platelets: 407 10*3/uL — ABNORMAL HIGH (ref 150–400)
RBC: 2.82 MIL/uL — ABNORMAL LOW (ref 4.22–5.81)
RDW: 14 % (ref 11.5–15.5)
WBC: 14.2 10*3/uL — ABNORMAL HIGH (ref 4.0–10.5)
nRBC: 0 % (ref 0.0–0.2)

## 2020-11-08 LAB — CK: Total CK: 359 U/L (ref 49–397)

## 2020-11-08 LAB — TRIGLYCERIDES: Triglycerides: 90 mg/dL (ref <150)

## 2020-11-08 LAB — MAGNESIUM: Magnesium: 1.9 mg/dL (ref 1.7–2.4)

## 2020-11-08 MED ORDER — BOOST / RESOURCE BREEZE PO LIQD CUSTOM: 1.0000 | Freq: Three times a day (TID) | ORAL | Status: DC

## 2020-11-08 MED ORDER — WHITE PETROLATUM EX OINT
TOPICAL_OINTMENT | CUTANEOUS | Status: AC
  Administered 2020-11-08: 0.2
  Filled 2020-11-08: qty 28.35

## 2020-11-08 MED ORDER — ACETAZOLAMIDE 250 MG PO TABS
500.0000 mg | ORAL_TABLET | Freq: Once | ORAL | Status: AC
  Administered 2020-11-08: 500 mg via ORAL
  Filled 2020-11-08: qty 2

## 2020-11-08 MED ORDER — ADULT MULTIVITAMIN W/MINERALS CH
1.0000 | ORAL_TABLET | Freq: Every day | ORAL | Status: DC
  Administered 2020-11-08: 1 via ORAL
  Filled 2020-11-08: qty 1

## 2020-11-08 MED ORDER — APIXABAN 5 MG PO TABS: 5.0000 mg | ORAL_TABLET | Freq: Two times a day (BID) | ORAL | Status: DC

## 2020-11-08 MED ORDER — PANTOPRAZOLE SODIUM 40 MG PO TBEC
40.0000 mg | DELAYED_RELEASE_TABLET | Freq: Every day | ORAL | Status: DC
  Administered 2020-11-08: 40 mg via ORAL
  Filled 2020-11-08 (×2): qty 1

## 2020-11-08 MED ORDER — VITAMIN B-12 1000 MCG PO TABS
1000.0000 ug | ORAL_TABLET | Freq: Every day | ORAL | Status: DC
  Administered 2020-11-09: 1000 ug via ORAL
  Filled 2020-11-08: qty 1

## 2020-11-08 MED ORDER — CARVEDILOL 12.5 MG PO TABS
12.5000 mg | ORAL_TABLET | Freq: Two times a day (BID) | ORAL | Status: DC
  Administered 2020-11-08 (×2): 12.5 mg via ORAL
  Filled 2020-11-08 (×3): qty 1

## 2020-11-08 MED ORDER — POLYETHYLENE GLYCOL 3350 17 G PO PACK: 17.0000 g | PACK | Freq: Every day | ORAL | Status: DC | PRN

## 2020-11-08 MED ORDER — APIXABAN 5 MG PO TABS
10.0000 mg | ORAL_TABLET | Freq: Two times a day (BID) | ORAL | Status: DC
  Administered 2020-11-08 – 2020-11-09 (×3): 10 mg via ORAL
  Filled 2020-11-08 (×3): qty 2

## 2020-11-08 MED ORDER — DOCUSATE SODIUM 100 MG PO CAPS: 100.0000 mg | ORAL_CAPSULE | Freq: Two times a day (BID) | ORAL | Status: DC | PRN

## 2020-11-08 MED ORDER — AMLODIPINE BESYLATE 10 MG PO TABS
10.0000 mg | ORAL_TABLET | Freq: Every day | ORAL | Status: DC
  Administered 2020-11-08 – 2020-11-09 (×2): 10 mg via ORAL
  Filled 2020-11-08 (×2): qty 1

## 2020-11-08 MED ORDER — SENNA 8.6 MG PO TABS
1.0000 | ORAL_TABLET | Freq: Two times a day (BID) | ORAL | Status: DC
  Administered 2020-11-09: 8.6 mg via ORAL
  Filled 2020-11-08: qty 1

## 2020-11-08 MED ORDER — ORAL CARE MOUTH RINSE
15.0000 mL | Freq: Two times a day (BID) | OROMUCOSAL | Status: DC
  Administered 2020-11-08 – 2020-11-09 (×3): 15 mL via OROMUCOSAL

## 2020-11-08 NOTE — Evaluation (Addendum)
Occupational Therapy Re-Evaluation Patient Details Name: Andrew [name] MRN: [id number] DOB: 06-29-1981 Today's Date: 11/08/2020    History of Present Illness 39y/o male admitted 3/2 with two weeks of chest pain.  Pt was emergently intubated 3/2- 3/4. EEG suggested moderate, diffuse encephalopathy without seizure activity. Found to be in rhabdo with AKI. RUE pain--MRI shoulder:Edema in musculature about the shoulder. reintubated 3/6 due to hypoactive delirium for airway protection; 3/7 lumbar puncture. Extubation 3/14. PMhx: PTSD, anxiety   Clinical Impression   Pt PTA: Pt living independently. Pt currently  limited by increased pain, decreased orientation, decreased strength and ability to follow commands while being less impulsive.  Pt set-upA to totalA for ADL; mobility with minA to modA +2 performing a squat pivot; no forward steps taken. Pt requires cues for orientation as pt has been intubated for much of stay and requires cues for explanation of care. Pt would benefit from continued OT skilled services. OT following acutely. O2 >95% on RA; HR 90s BP remained high: supine at rest 165/107; sitting EOB: 181/117 after exertion seated in recliner: 165/122. RN aware as pt on a drip for BP.  **Hoping that pt will resume physical activities quickly for d/c with HH.    Follow Up Recommendations  CIR;Supervision/Assistance - 24 hour    Equipment Recommendations  None recommended by OT    Recommendations for Other Services Rehab consult     Precautions / Restrictions Precautions Precautions: Fall Restrictions Weight Bearing Restrictions: No      Mobility Bed Mobility Overal bed mobility: Needs Assistance Bed Mobility: Supine to Sit     Supine to sit: Mod assist     General bed mobility comments: modA with use of rail and assist form PT to elevate trunk to EOB with cues to scoot to EOB. HOB elevated ~30*    Transfers Overall transfer level: Needs assistance Equipment used:  2 person hand held assist Transfers: Sit to/from [employer] Sit to Stand: Mod assist;+2 physical assistance;+2 safety/equipment;From elevated surface   Squat pivot transfers: Mod assist;+2 physical assistance;+2 safety/equipment;From elevated surface     General transfer comment: Pt with +2 hand held assist for sit to stand; pt impulsively performing squat pivot to recliner without taking any steps. ModA +2 for stability and for equipment/line management    Balance Overall balance assessment: Needs assistance Sitting-balance support: Bilateral upper extremity supported;Feet supported Sitting balance-Leahy Scale: Fair       Standing balance-Leahy Scale: Poor Standing balance comment: ModA +2 for standing balance and stability                           ADL either performed or assessed with clinical judgement   ADL Overall ADL's : Needs assistance/impaired Eating/Feeding: NPO   Grooming: Minimal assistance;Sitting Grooming Details (indicate cue type and reason): refused at this time stating "I think I would throw up if I tried brushing my teeth now." Upper Body Bathing: Minimal assistance;Sitting   Lower Body Bathing: Maximal assistance;Sitting/lateral leans;Sit to/from stand;Cueing for safety   Upper Body Dressing : Minimal assistance;Sitting   Lower Body Dressing: Maximal assistance;Cueing for safety;Cueing for sequencing;Sitting/lateral leans;Sit to/from stand   Toilet Transfer: Moderate assistance;+2 for physical assistance;+2 for safety/equipment;Squat-pivot;Requires drop arm Toilet Transfer Details (indicate cue type and reason): modA +2 for stability Toileting- Clothing Manipulation and Hygiene: Total assistance Toileting - Clothing Manipulation Details (indicate cue type and reason): catheter and flexseal in     Functional mobility during ADLs: Moderate  assistance General ADL Comments: Pt limited by increased pain, decreased orientation,  decreased strength and ability to follow commands while being less impulsive.     Vision Baseline Vision/History: No visual deficits Patient Visual Report: No change from baseline Vision Assessment?: No apparent visual deficits     Perception     Praxis      Pertinent Vitals/Pain Pain Assessment: 0-10 Pain Score: 5  Pain Location: generalized Pain Descriptors / Indicators: Grimacing;Guarding;Tightness Pain Intervention(s): Monitored during session;Repositioned     Hand Dominance Right   Extremity/Trunk Assessment Upper Extremity Assessment Upper Extremity Assessment: Generalized weakness;RUE deficits/detail;LUE deficits/detail RUE Deficits / Details: AAROM, WFLs; tightness in hand and shoulder FF to 80*. Pt able to grip minimally due to edema. LUE Deficits / Details: AAROM, WFLs; tightness in hand and shoulder FF to 80*. Grip 4/5 MM grade, edema present   Lower Extremity Assessment Lower Extremity Assessment: Generalized weakness;Defer to PT evaluation   Cervical / Trunk Assessment Cervical / Trunk Assessment: Normal   Communication Communication Communication: No difficulties   Cognition Arousal/Alertness: Awake/alert Behavior During Therapy: Impulsive;Flat affect Overall Cognitive Status: Impaired/Different from baseline Area of Impairment: Orientation;Following commands;Safety/judgement;Awareness;Problem solving                 Orientation Level: Disoriented to;Situation;Time     Following Commands: Follows one step commands with increased time Safety/Judgement: Decreased awareness of safety;Decreased awareness of deficits Awareness: Intellectual Problem Solving: Slow processing;Requires verbal cues;Requires tactile cues General Comments: Pt following 1 step commands, but impulsive. Pt wanting to get up abruptly, but requires cues to slow down to wait for safety when PT/OT was ready.   General Comments  O2 >95% on RA; HR 90s BP remained high: supine at  rest 165/107; sitting EOB: 181/117 after exertion seated in recliner: 165/122. RN aware as pt on a drip for BP.    Exercises     Shoulder Instructions      Home Living Family/patient expects to be discharged to:: Private residence Living Arrangements: Spouse/significant other Available Help at Discharge: Available 24 hours/day;Friend(s) Type of Home: House Home Access: Stairs to enter [employer] of Steps: 8   Home Layout: One level     Bathroom Shower/Tub: Chief Strategy Officer: Standard     Home Equipment: None          Prior Functioning/Environment Level of Independence: Independent        Comments: no falls in the last year, disabled veteran        OT Problem List: Decreased strength;Decreased range of motion;Decreased activity tolerance;Impaired balance (sitting and/or standing);Impaired vision/perception;Decreased coordination;Decreased cognition;Decreased safety awareness;Decreased knowledge of use of DME or AE;Increased edema;Pain;Impaired UE functional use      OT Treatment/Interventions: Self-care/ADL training;DME and/or AE instruction;Patient/family education;Balance training;Cognitive remediation/compensation;Therapeutic activities;Therapeutic exercise    OT Goals(Current goals can be found in the care plan section) Acute Rehab OT Goals Patient Stated Goal: to go home OT Goal Formulation: With patient Time For Goal Achievement: 11/22/20 Potential to Achieve Goals: Good  OT Frequency: Min 2X/week   Barriers to D/C:            Co-evaluation PT/OT/SLP Co-Evaluation/Treatment: Yes Reason for Co-Treatment: Complexity of the patient's impairments (multi-system involvement);To address functional/ADL transfers;For patient/therapist safety   OT goals addressed during session: ADL's and self-care;Strengthening/ROM      AM-PAC OT "6 Clicks" Daily Activity     Outcome Measure Help from another person eating meals?: Total  (NPO) Help from another person taking care of personal  grooming?: A Little Help from another person toileting, which includes using toliet, bedpan, or urinal?: Total Help from another person bathing (including washing, rinsing, drying)?: A Lot Help from another person to put on and taking off regular upper body clothing?: A Little Help from another person to put on and taking off regular lower body clothing?: A Lot 6 Click Score: 12   End of Session Nurse Communication: Mobility status  Activity Tolerance: Patient tolerated treatment well;Patient limited by fatigue Patient left: in chair;with call bell/phone within reach;with chair alarm set  OT Visit Diagnosis: Unsteadiness on feet (R26.81);Muscle weakness (generalized) (M62.81);Other symptoms and signs involving cognitive function;Pain Pain - Right/Left: Right Pain - part of body: Arm                Time: 4259-5638 OT Time Calculation (min): 25 min Charges:  OT General Charges $OT Visit: 1 Visit OT Evaluation $OT Re-eval: 1 Re-eval  Flora Lipps, OTR/L Acute Rehabilitation Services Pager: [phone] Office: [phone]   Andrew [name] C 11/08/2020, 3:07 PM

## 2020-11-08 NOTE — Progress Notes (Signed)
Patient ID: Andrew [name], male   DOB: 1981/05/19, 40 y.o.   MRN: [id number] [facility] Progress Note   Assessment/ Plan:   1. Acute kidney Injury: Secondary to rhabdomyolysis/pigment nephropathy; polyuric overnight status post furosemide and with continued improvement of his renal function off dialysis.  Would be okay to discontinued dialysis catheter if this becomes a safety concern with his mental status. 2.  Hypertension: Blood pressures remain generally elevated on amlodipine/metoprolol-this in part appears to be driven by his agitation. 3.  Ventilator dependent respiratory failure: Secondary to generalized weakness/acute hyperactive delirium. 4.  Hypernatremia: This is secondary to polyuria/impaired free water intake-encourage oral intake. 5.  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  Subjective:   Overnight events noted with intermittent agitation/confusion   Objective:   BP (!) 151/101   Pulse 90   Temp 97.7 F (36.5 C) (Oral)   Resp 20   Ht 5' 10"  (1.778 m)   Wt 109.8 kg   SpO2 99%   BMI 34.73 kg/m   Intake/Output Summary (Last 24 hours) at 11/08/2020 0721 Last data filed at 11/08/2020 0600 Gross per 24 hour  Intake 1765.21 ml  Output 6363 ml  Net -4597.79 ml   Weight change: 0 kg  Physical Exam: Gen: Currently comfortable resting in bed-eyes covered with towel CVS: Pulse regular tachycardia, S1 and S2 normal Resp: Anteriorly clear to auscultation, no rales/rhonchi Abd: Soft, obese, nontender, bowel sounds normal Ext: Trace-1+ lower extremity edema.  Imaging: DG Abd 1 View  Result Date: 11/06/2020 CLINICAL DATA:  Abdominal distension EXAM: ABDOMEN - 1 VIEW COMPARISON:  November 05, 2020 FINDINGS: Nasogastric tube tip and side port in stomach. No bowel dilatation or air-fluid level to suggest bowel obstruction. No free air. No abnormal calcifications. IMPRESSION:  Nasogastric tube tip and side port in stomach. No bowel dilatation. No findings indicative of bowel obstruction or free air on supine examination. Electronically Signed   By: Lowella Grip III M.D.   On: 11/06/2020 10:47   DG CHEST PORT 1 VIEW  Result Date: 11/06/2020 CLINICAL DATA:  Chest pain.  Hypoxia. EXAM: PORTABLE CHEST 1 VIEW COMPARISON:  November 04, 2020 FINDINGS: Endotracheal tube tip is 4.2 cm above the carina. Nasogastric tube tip and side port are below the diaphragm. Central catheter tip is in the superior vena cava. No pneumothorax. Lungs are clear. Heart is upper normal in size with pulmonary vascularity normal. No adenopathy. No bone lesions. IMPRESSION: Tube and catheter positions as described without evident pneumothorax. No edema or airspace opacity. Stable cardiac silhouette. Electronically Signed   By: Lowella Grip III M.D.   On: 11/06/2020 10:46   VAS [place] LOWER EXTREMITY VENOUS (DVT)  Result Date: 11/07/2020  Lower Venous DVT Study Indications: Edema, and Suspected PE.  Risk Factors: Rhabdomyolysis. Comparison Study: No previous exams. Performing Technologist: Rogelia Rohrer  Examination Guidelines: A complete evaluation includes B-mode imaging, spectral Doppler, color Doppler, and power Doppler as needed of all accessible portions of each vessel. Bilateral testing is considered an integral part of a complete examination. Limited examinations for reoccurring indications may be performed as noted. The reflux portion of the exam is performed with the patient in reverse Trendelenburg.  +---------+---------------+---------+-----------+----------+-------------------+ RIGHT    CompressibilityPhasicitySpontaneityPropertiesThrombus Aging      +---------+---------------+---------+-----------+----------+-------------------+ CFV      Full           Yes      Yes                                      +---------+---------------+---------+-----------+----------+-------------------+  SFJ       Full                                                             +---------+---------------+---------+-----------+----------+-------------------+ FV Prox  Full           Yes      Yes                                      +---------+---------------+---------+-----------+----------+-------------------+ FV Mid   Full           Yes      Yes                                      +---------+---------------+---------+-----------+----------+-------------------+ FV DistalFull           Yes      Yes                                      +---------+---------------+---------+-----------+----------+-------------------+ PFV      Full                                                             +---------+---------------+---------+-----------+----------+-------------------+ POP      Full           Yes      Yes                                      +---------+---------------+---------+-----------+----------+-------------------+ PTV      Full                                                             +---------+---------------+---------+-----------+----------+-------------------+ PERO     Full                                                             +---------+---------------+---------+-----------+----------+-------------------+ Gastroc  Partial        No       No                   Acute - one of  paired gastroc      +---------+---------------+---------+-----------+----------+-------------------+   +---------+---------------+---------+-----------+----------+--------------+ LEFT     CompressibilityPhasicitySpontaneityPropertiesThrombus Aging +---------+---------------+---------+-----------+----------+--------------+ CFV      Full           Yes      Yes                                 +---------+---------------+---------+-----------+----------+--------------+ SFJ      Full                                                         +---------+---------------+---------+-----------+----------+--------------+ FV Prox  Full           Yes      Yes                                 +---------+---------------+---------+-----------+----------+--------------+ FV Mid   Full           Yes      Yes                                 +---------+---------------+---------+-----------+----------+--------------+ FV DistalFull           Yes      Yes                                 +---------+---------------+---------+-----------+----------+--------------+ PFV      Full                                                        +---------+---------------+---------+-----------+----------+--------------+ POP      Full           Yes      Yes                                 +---------+---------------+---------+-----------+----------+--------------+ PTV      Full                                                        +---------+---------------+---------+-----------+----------+--------------+ PERO     Full                                                        +---------+---------------+---------+-----------+----------+--------------+ Gastroc  None           No       No                   Acute          +---------+---------------+---------+-----------+----------+--------------+  Summary: BILATERAL: -No evidence of popliteal cyst, bilaterally. RIGHT: - Findings consistent with acute deep vein thrombosis involving one of the paired right gastrocnemius veins.  LEFT: - Findings consistent with acute deep vein thrombosis involving the left gastrocnemius veins.  *See table(s) above for measurements and observations.    Preliminary    ECHOCARDIOGRAM LIMITED  Result Date: 11/07/2020    ECHOCARDIOGRAM LIMITED REPORT   Patient Name:   EUDELL MCPHEE Date of Exam: 11/07/2020 Medical Rec #:  [id number]          Height:       70.0 in Accession #:    [id number]         Weight:        253.5 lb Date of Birth:  Dec 22, 1980           BSA:          2.309 m Patient Age:    38 years           BP:           186/101 mmHg Patient Gender: M                  HR:           128 bpm. Exam Location:  Inpatient Procedure: Cardiac Doppler and Color Doppler Indications:    Pulmonary embolus  History:        Patient has prior history of Echocardiogram examinations, most                 recent 10/27/2020. Signs/Symptoms:Chest Pain; Risk                 Factors:Hypertension. Acute bilateral DVT.  Sonographer:    Clayton Lefort RDCS (AE) Referring Phys: [id number] Bruce  1. Left ventricular ejection fraction, by estimation, is 60 to 65%. The left ventricle has normal function. The left ventricle has no regional wall motion abnormalities.  2. Pt has acute bilateral DVT. There is no evidence of RV strain or enlargement by echo to suggest a large pulmonary embolus. Suggest alternative imaging for pulmonary embolus if not already performed. I note that his creatinine is elevated. Consider VQ  scan .Marland Kitchen Right ventricular systolic function is normal. The right ventricular size is normal.  3. The mitral valve is normal in structure. No evidence of mitral valve regurgitation. No evidence of mitral stenosis.  4. The aortic valve is normal in structure. Aortic valve regurgitation is not visualized. No aortic stenosis is present. FINDINGS  Left Ventricle: Left ventricular ejection fraction, by estimation, is 60 to 65%. The left ventricle has normal function. The left ventricle has no regional wall motion abnormalities. The left ventricular internal cavity size was normal in size. Right Ventricle: Pt has acute bilateral DVT. There is no evidence of RV strain or enlargement by echo to suggest a large pulmonary embolus. Suggest alternative imaging for pulmonary embolus if not already performed. I note that his creatinine is elevated. Consider VQ scan. The right ventricular size is normal. Right ventricular systolic function  is normal. Mitral Valve: The mitral valve is normal in structure. No evidence of mitral valve stenosis. Aortic Valve: The aortic valve is normal in structure. Aortic valve regurgitation is not visualized. No aortic stenosis is present. LEFT VENTRICLE PLAX 2D LVOT diam:     2.10 cm LVOT Area:     3.46 cm  RIGHT VENTRICLE         IVC TAPSE (M-mode): 2.8 cm  IVC diam: 2.30 cm  AORTA Ao Root diam: 3.30 cm  SHUNTS Systemic Diam: 2.10 cm Mertie Moores MD Electronically signed by Mertie Moores MD Signature Date/Time: 11/07/2020/5:23:17 PM    Final    CT CHEST ABDOMEN PELVIS WO CONTRAST  Result Date: 11/06/2020 CLINICAL DATA:  Sudden onset chest and abdominal pain EXAM: CT CHEST, ABDOMEN AND PELVIS WITHOUT CONTRAST TECHNIQUE: Multidetector CT imaging of the chest, abdomen and pelvis was performed following the standard protocol without IV contrast. COMPARISON:  None. FINDINGS: CT CHEST FINDINGS Cardiovascular: No significant vascular findings. Normal heart size. No pericardial effusion. Mediastinum/Nodes: No enlarged mediastinal, hilar, or axillary lymph nodes. Thyroid gland, trachea, and esophagus demonstrate no significant findings. Lungs/Pleura: Endotracheal intubation. Predominantly dependent heterogeneous and consolidative airspace opacity throughout the lungs. Small bilateral pleural effusions. Interlobular septal thickening. Musculoskeletal: No chest wall mass or suspicious bone lesions identified. CT ABDOMEN PELVIS FINDINGS Hepatobiliary: No solid liver abnormality is seen. No gallstones, gallbladder wall thickening, or biliary dilatation. Pancreas: Unremarkable. No pancreatic ductal dilatation or surrounding inflammatory changes. Spleen: Normal in size without significant abnormality. Adrenals/Urinary Tract: Adrenal glands are unremarkable. Kidneys are normal, without renal calculi, solid lesion, or hydronephrosis. Bladder is unremarkable. Stomach/Bowel: Stomach is within normal limits. Appendix appears normal.  No evidence of bowel wall thickening, distention, or inflammatory changes. Rectal tube in position. Vascular/Lymphatic: No significant vascular findings are present. No enlarged abdominal or pelvic lymph nodes. Reproductive: No mass or other abnormality. Other: Anasarca.  Small volume ascites. Musculoskeletal: No acute or significant osseous findings. IMPRESSION: 1. Predominantly dependent heterogeneous and consolidative airspace opacity throughout the lungs, with small bilateral pleural effusions. Interlobular septal thickening. Findings are consistent with multifocal infection, edema, and/or ARDS. 2. Endotracheal intubation. 3. Small volume ascites and anasarca. 4. No non-contrast CT findings of the abdomen or pelvis to explain abdominal pain. Electronically Signed   By: Eddie Candle M.D.   On: 11/06/2020 12:14    Labs: BMET Recent Labs  Lab 11/02/20 0129 11/03/20 0143 11/04/20 0208 11/05/20 0311 11/06/20 0416 11/06/20 1024 11/07/20 0140 11/07/20 0818 11/08/20 0309 11/08/20 0330  NA 135 141 141 141 145 144 146*  --  154* 150*  K 4.2 4.3 3.9 4.0 5.0 5.3* 4.7 4.0 4.0 4.0  CL 101 105 107 107 111 109 109  --   --  112*  CO2 17* 20* 22 26 28 24 23   --   --  28  GLUCOSE 132* 132* 154* 146* 115* 138* 104*  --   --  128*  BUN 90* 93* 71* 49* 53* 58* 61*  --   --  59*  CREATININE 11.14* 10.29* 7.16* 4.53* 3.86* 3.66* 3.34*  --   --  2.79*  CALCIUM 7.0* 7.7* 8.0* 8.3* 8.7* 9.0 8.8*  --   --  8.9  PHOS 6.8* 6.9* 5.9*  --   --   --   --   --   --   --    CBC Recent Labs  Lab 11/05/20 0311 11/06/20 0416 11/07/20 0140 11/08/20 0309 11/08/20 0330  WBC 13.2* 13.9* 15.1*  --  14.2*  HGB 8.5* 8.6* 8.7* 8.8* 8.6*  HCT 27.0* 27.4* 27.1* 26.0* 27.5*  MCV 96.8 97.5 96.8  --  97.5  PLT 306 335 382  --  407*    Medications:    . chlorhexidine gluconate (MEDLINE KIT)  15 mL Mouth Rinse BID  . Chlorhexidine Gluconate Cloth  6 each Topical Q0600  . Chlorhexidine Gluconate Cloth  6 each Topical  [id number]  . feeding supplement (PROSource TF)  45 mL Per Tube QID  . mouth rinse  15 mL Mouth Rinse 10 times per day  . metoCLOPramide (REGLAN) injection  5 mg Intravenous Q8H  . metoprolol tartrate  10 mg Intravenous Q6H  . multivitamin  15 mL Per Tube Daily  . pantoprazole sodium  40 mg Per Tube Daily  . senna  1 tablet Per Tube BID  . sodium chloride flush  10-40 mL Intracatheter Q12H  . vitamin B-12  1,000 mcg Per Tube Daily   Elmarie Shiley, MD 11/08/2020, 7:21 AM

## 2020-11-08 NOTE — Progress Notes (Signed)
ANTICOAGULATION CONSULT NOTE - Initial Consult  Pharmacy Consult for heparin dosing >> Eliquis  Indication: VTE suspicion   No Known Allergies  Patient Measurements: Height: 5\' 10"  (177.8 cm) Weight: 109.8 kg (242 lb 1 oz) IBW/kg (Calculated) : 73 Heparin Dosing Weight: 98 kg (TBW >125% IBW)  Vital Signs: Temp: 97.7 F (36.5 C) (03/15 0327) Temp Source: Oral (03/15 0327) BP: 144/92 (03/15 0700) Pulse Rate: 92 (03/15 0700)  Labs: Recent Labs    11/06/20 0416 11/06/20 1010 11/06/20 1024 11/06/20 1635 11/07/20 0140 11/07/20 1942 11/08/20 0309 11/08/20 0330  HGB 8.6*  --   --   --  8.7*  --  8.8* 8.6*  HCT 27.4*  --   --   --  27.1*  --  26.0* 27.5*  PLT 335  --   --   --  382  --   --  407*  HEPARINUNFRC  --   --   --   --   --  0.73*  --  0.66  CREATININE 3.86*  --  3.66*  --  3.34*  --   --  2.79*  CKTOTAL  --   --   --   --  540*  --   --  359  TROPONINIHS  --  20*  --  24*  --   --   --   --     Estimated Creatinine Clearance: 42.8 mL/min (A) (by C-G formula based on SCr of 2.79 mg/dL (H)).   Medical History: History reviewed. No pertinent past medical history.  Medications:  SQ heparin 5,000 units Q8H 3/4 >> 3/14  Assessment: 40 y.o. M presented to ER 10/26/20 with chest pain and acute hyperactive delerium requiring mechanical ventilation. Patient was found to be in rhabdo. Sudden chest pain associated with tachycardia, tachypnea, diaphoresis noted on 11/06/20, suggestive of potential VTE. D-Dimer elevated at 11.45 in setting of rhabdo and AKI.   Heparin continuous infusion initiated at 1600 units/hr after 5000 unit bolus on 3/14. 6 hour heparin level was elevated at 0.73 and heparin infusion decreased to 1500 units/hr. Heparin level now in range at 0.66.   Per RN, no signs of bleeding present. Patient pulled out PIV AM of 11/08/20, heparin infusion not actively running. Discussed with MD and pharmacy consulted to switch to apixaban.   Plan:   Initiate apixaban  10 mg BID for 7 days, then 5 mg BID   Discontinue heparin gtt   Monitor for signs and symptoms of bleeding    11/10/20, PharmD Candidate 11/08/2020,7:38 AM

## 2020-11-08 NOTE — Progress Notes (Signed)
eLink Physician-Brief Progress Note Patient Name: Andrew [name] DOB: 25-Sep-1980 MRN: [id number]   Date of Service  11/08/2020  HPI/Events of Note  Patient less interactive than he was earlier in the shift, he is on 1.2 mcg of Precedex gtt.  eICU Interventions  Bedside RN instructed to turn down Precedex infusion, stat ABG ordered to r/o hypercapnia.        [name] 11/08/2020, 2:53 AM

## 2020-11-08 NOTE — Progress Notes (Signed)
Called because patient desaturated to 80s, now on NRB. On exam saturating 100% on NRB, tachypneic, but no accessory muscle use, not in distress. Symmetric breath sounds.   CXR with likely RLL opacity raising concern for silent aspiration as his diet was liberalized today. No clinical signs of aspiration. Last ate/drank about 1 hour before this desaturation episode. Took NRB off for 1-2 minutes with saturations only dropping to 99%, RR in 30s. Now on [place], decreasing precedex. Encourage pulmonary hygiene, especially coughing. NPO and will need MBSS. Will con't to monitor clinically but no need to reintubate.  Steffanie Dunn, DO 11/08/20 5:33 PM [facility]

## 2020-11-08 NOTE — Discharge Instructions (Addendum)
Information on my medicine - ELIQUIS (apixaban)  This medication education was reviewed with me or my healthcare representative as part of my discharge preparation.    Why was Eliquis prescribed for you? Eliquis was prescribed to treat blood clots that may have been found in the veins of your legs (deep vein thrombosis) or in your lungs (pulmonary embolism) and to reduce the risk of them occurring again.  What do You need to know about Eliquis ?  Your current dose is ONE 5 mg tablet taken TWICE daily.  Eliquis may be taken with or without food.   Try to take the dose about the same time in the morning and in the evening. If you have difficulty swallowing the tablet whole please discuss with your pharmacist how to take the medication safely.  Take Eliquis exactly as prescribed and DO NOT stop taking Eliquis without talking to the doctor who prescribed the medication.  Stopping may increase your risk of developing a new blood clot.  Refill your prescription before you run out.  After discharge, you should have regular check-up appointments with your healthcare provider that is prescribing your Eliquis.    What do you do if you miss a dose? If a dose of ELIQUIS is not taken at the scheduled time, take it as soon as possible on the same day and twice-daily administration should be resumed. The dose should not be doubled to make up for a missed dose.  Important Safety Information A possible side effect of Eliquis is bleeding. You should call your healthcare provider right away if you experience any of the following: Bleeding from an injury or your nose that does not stop. Unusual colored urine (red or dark brown) or unusual colored stools (red or black). Unusual bruising for unknown reasons. A serious fall or if you hit your head (even if there is no bleeding).  Some medicines may interact with Eliquis and might increase your risk of bleeding or clotting while on Eliquis. To help  avoid this, consult your healthcare provider or pharmacist prior to using any new prescription or non-prescription medications, including herbals, vitamins, non-steroidal anti-inflammatory drugs (NSAIDs) and supplements.  This website has more information on Eliquis (apixaban): http://[email or web address].com/eliquis/home  

## 2020-11-08 NOTE — Evaluation (Signed)
Clinical/Bedside Swallow Evaluation Patient Details  Name: ESSAM LOWDERMILK MRN: [id number] Date of Birth: 09-09-1980  Today's Date: 11/08/2020 Time: SLP Start Time (ACUTE ONLY): 0815 SLP Stop Time (ACUTE ONLY): 0824 SLP Time Calculation (min) (ACUTE ONLY): 9 min  Past Medical History: History reviewed. No pertinent past medical history. Past Surgical History: History reviewed. No pertinent surgical history. HPI:  Pt admitted withAcute Hypoxic Respiratory Failure requiring Mechanical Ventilation in setting of agitated delirium, unclear cause.  Subsequently found to be in rhabdo with AKI Failed extubation 3/3-3/4, reintubated 3/6 to 3/14.   Assessment / Plan / Recommendation Clinical Impression  Pt demonstrates no signs of aspiration or dyspahgia. He both passed a 3 oz water swallow with RN and SLP. His voice was initially hoarse, but improved after 8 oz of water. Able to masticate well. Pt currently on clear liquids, may upgrade to regular solids per critical care given concern for distended belly. Will sign off at this time. SLP Visit Diagnosis: Dysphagia, unspecified (R13.10)    Aspiration Risk  Mild aspiration risk    Diet Recommendation Regular;Thin liquid   Liquid Administration via: Cup;Straw Medication Administration: Whole meds with liquid Supervision: Patient able to self feed Postural Changes: Seated upright at 90 degrees    Other  Recommendations     Follow up Recommendations None      Frequency and Duration            Prognosis        Swallow Study   General HPI: Pt admitted withAcute Hypoxic Respiratory Failure requiring Mechanical Ventilation in setting of agitated delirium, unclear cause.  Subsequently found to be in rhabdo with AKI Failed extubation 3/3-3/4, reintubated 3/6 to 3/14. Type of Study: Bedside Swallow Evaluation Previous Swallow Assessment: none Diet Prior to this Study: NPO Temperature Spikes Noted: No Respiratory Status: Nasal  cannula History of Recent Intubation: Yes Length of Intubations (days): 11 days Date extubated: 11/07/20 Behavior/Cognition: Alert;Cooperative Oral Cavity Assessment: Dry Oral Care Completed by SLP: Recent completion by staff Oral Cavity - Dentition: Adequate natural dentition Vision: Functional for self-feeding Patient Positioning: Upright in bed Baseline Vocal Quality: Hoarse Volitional Cough: Strong Volitional Swallow: Able to elicit    Oral/Motor/Sensory Function Overall Oral Motor/Sensory Function: Within functional limits   Ice Chips Ice chips: Within functional limits   Thin Liquid Thin Liquid: Within functional limits Presentation: Straw    Nectar Thick Nectar Thick Liquid: Not tested   Honey Thick Honey Thick Liquid: Not tested   Puree Puree: Within functional limits   Solid     Solid: Within functional limits     Harlon Ditty, MA CCC-SLP  Acute Rehabilitation Services Pager [phone] Office [phone]  Claudine Mouton 11/08/2020,9:00 AM

## 2020-11-08 NOTE — Progress Notes (Signed)
NAME:  Andrew [name], MRN:  [id number], DOB:  1981-03-17, LOS: 12 ADMISSION DATE:  10/26/2020,   Brief History:  40 y/o male admitted 3/2 with chest pain & acute hyperactive delirium requiring mechanical ventilation.  Subsequently found to be in rhabdo with AKI requiring HD.  Concern Wellbutrin, Seroquel contributing.  UDS negative x2, CSF negative, MRI negative. Failed extubation 3/4, re-intubated 3/6, improved 3/14 and extubated successfully.  Past Medical History:  PTSD Depression  Significant Hospital Events:  3/04 extubated, AKI worse, found to be in rhabdo 3/05 Continues to have diffuse muscle pain, renal consult 3/06 increased body pain particularly right shoulder, concern for septic arthritis, MRI neg 3/13 Abd distention, cuff leak, bloody drainage around ETT  3/14 Weaning on PSV, remains on high dose sedation, extubated, delirium overnight on dex  Consults:  Renal Psych Neuro  Procedures:  ETT 3/3  >> 3/4 ETT 3/6 >> 3/14 HD catheter 3/9 >> 3/15  Significant Diagnostic Tests:   Head CT 3/3 >> sphenoid sinusitis  Echo 3/3 >> nml LVEF, no WMA  MRI right shoulder 3/6 >> no evidence of septic joint, no effusion, severe edema and muscles around shoulder joint consistent with rhabdo  Head CT 3/6 >> neg  CSF cell count 3/7 >> WBC 2, RBC 2 (hypocellular)  MRI brain 3/7 >> normal, paranasal sinus mucosal thickening, small mastoid effusions  BLLE Ultrasound 3/14  >> BL DVT gastrocnemius  Limited ECHO 3/15 >> EF 60, no evidence of RV strain  Micro Data:  COVID 3/3 >> negative Influenza 3/3 >> negative Resp 3/3 >> nml flora Blood 3/3 >> negative Blood 3/6 >> negative CSF 3/7 >> negative  Fecal lactoferrin 3/11 >> negative UA 3/14 >> +Ketones, Neg  Antimicrobials:  Ceftx 3/3 >> 3/6 Azithro 3/3 x 1 Cefazolin 3/6 >> 3/7 Ceftriaxone 3/7 >> 3/9 Vanc 3/6  X 1 dose Vanco 3/7 >> 3/8 Ceftriaxone 3/13 >> 3/15  Interval events:  Tmax 100.5, extubated 3/14,  agitated overnight and precedex gtt restarted, PT had a vasovagal event overnight while drinking water, pt removed IV this AM   Objective   Blood pressure (!) 144/92, pulse 92, temperature 97.7 F (36.5 C), temperature source Oral, resp. rate 20, height 5\' 10"  (1.778 m), weight 109.8 kg, SpO2 100 %.    Vent Mode: CPAP;PSV FiO2 (%):  [30 %] 30 % PEEP:  [5 cmH20] 5 cmH20 Pressure Support:  [5 cmH20] 5 cmH20   Intake/Output Summary (Last 24 hours) at 11/08/2020 0835 Last data filed at 11/08/2020 0700 Gross per 24 hour  Intake 1789.96 ml  Output 5625 ml  Net -3835.04 ml   Filed Weights   11/07/20 0356 11/07/20 1000 11/08/20 0346  Weight: 115 kg 115 kg 109.8 kg    Examination: General: Sitting in bed, appears comfortable, arouses to voice HEENT: MM pink/moist, trachea midline Neuro: PEERL, GCS 14, RASS -1, MAE CV: s1s2, NSR 90's, no m/r/g PULM: RA,  Lungs clear, symmetrical expansion, tachypneic   GI: Distended, +BS in all four quadrants, brown stool appreciated in FMS  Extremities: warm/dry, +1 pretibial edema Skin: no rashes or lesions     Resolved problems:  Acute AG metabolic acidosis Bloody/coffee-ground NG aspirate Acute Hypoxic Respiratory Failure requiring Mechanical Ventilation      Assessment & Plan:    Acute Respiratory Alkalosis Tachypnea with respiratory alkalosis with known BLLE DVT, would suggest PE - Continue heparin gtt, with goal of transition to eliquis managed by pharmacy - Will follow up ABG later today/AM or  if acute decompensation or mental status change  Acute Encephalopathy with hyperactive delirium; delirium controlled currently. Evolving neuropathy, suspected due to uremia PTA on multiple psychoactive medications including Seroquel and Wellbutrin. UDS and head CT negative on admit, denies history of drug abuse, 3/5 staff notes girlfriend visiting before his deterioration in mental status. She was noted to be unsteady, suspicious for  surreptitious drug use, UDS repeated 3/6 only shows benzos and opiates which are prescribed medicines. MRI brain, CSF no growth. - Continuing to hold home Wellbutrin and Seroquel per psych recs until CK is less than 300 - Attempting to wean precedex gtt - Continue PAD protocol - Delirium prevention with mobilization today - Continue B12 supplementation - Minimize benzo use as tolerated   Aspiration Pneumonitis without clear pneumonia Sphenoid sinusitis Completed 7-day course antibiotics completed on 3/9.   - Will order CXR if respiratory status declines today post initation of clear liquid diet  Leukocytosis/Fever Patient found to have BL DVT on [place]. Could be source of fever and WBC elevation. - Cont heparin drip with plan to switch to eliquis - UA negative, rocephin stopped - Foley discontued - WBC down to 14.2 from 15.1  Bilateral Gastrocnemius Deep Vein Thrombosis Initiated heparin drip 3/14 -Pharmacy consult for transition to eliquis - recommend eliquis for 3 to 6 months   Hypertension, not on home agents - Initated PO coreg and norvasc now that patient is tolerating PO intake - Discontinued IV lopressor - Continue PRN Hydralazine for systolic greater than 160 - Goal to wean esmolol as tolerated today - Follow up on aldosterone and renin activity ratio angiotensin lab  Tachycardia, elevated d-dimer Consistent with suspected PE -  ECHO demonstrated EF around 60% with no RV strain. - Considered CTA chest but risk of imaging in the setting of AKI not worth the yield of the imaging at this time.  - Continue heparin gtt with goal to transition to eliquis - If patient becomes acutely hemodynamically unstable will consider thrombolytic therapy  AKI , rhabdomyolysis Hypernatremia Hyperphosphatemia Cause unclear.  Question meds vs aggressively fighting/ resisting.  No overt seizure activity noted. - Appreciate Nephro's management and recs, will remove HD cath today - Trending  CK, continues to downtrend, will follow up tomorrow - One dose of Diamox today for continued diuresis in the setting of contraction alkalosis  - Initiate clear liquid diet, will follow up PO intake, hoping Na will fall with increased PO free water intake. - Continue trending BMP and UOP, will consider cautious electrolyte replacement if needed - Continue avoiding nephrotoxic agents and ensure adequate renal perfusion.  Hyperglycemia - BG goal 140-180 - Well controlled, continue to monitor with initiation of diet  Ileus, diarrhea Abdomen distended but bowel sounds improving with toleration of PO intake, stool output overnight - Start Clear liquid diet - Encourage OOB - Minimize narcotic infusions - Continue Reglan  Anemia due to critical illness, dilution -Continue to trend CBC - Will transfuse for HBG <7   Best practice (evaluated daily)  Diet: Clears Pain/Anxiety/Delirium protocol (if indicated): precedex gtt VAP protocol (if indicated): n/a DVT prophylaxis: heparin gtt GI prophylaxis: protonix Glucose control: SSI Mobility: bedrest Disposition: ICU Family: Lakeshia sister updated 3/15  Goals of Care:   Code Status: Full Code   Labs   CBC: Recent Labs  Lab 11/04/20 0208 11/05/20 0311 11/06/20 0416 11/07/20 0140 11/08/20 0309 11/08/20 0330  WBC 12.4* 13.2* 13.9* 15.1*  --  14.2*  HGB 9.1* 8.5* 8.6* 8.7* 8.8* 8.6*  HCT  28.0* 27.0* 27.4* 27.1* 26.0* 27.5*  MCV 94.3 96.8 97.5 96.8  --  97.5  PLT 338 306 335 382  --  407*    Basic Metabolic Panel: Recent Labs  Lab 11/02/20 0129 11/03/20 0143 11/04/20 0208 11/05/20 0311 11/06/20 0416 11/06/20 1024 11/07/20 0140 11/07/20 0818 11/08/20 0309 11/08/20 0330  NA 135 141 141 141 145 144 146*  --  154* 150*  K 4.2 4.3 3.9 4.0 5.0 5.3* 4.7 4.0 4.0 4.0  CL 101 105 107 107 111 109 109  --   --  112*  CO2 17* 20* 22 26 28 24 23   --   --  28  GLUCOSE 132* 132* 154* 146* 115* 138* 104*  --   --  128*  BUN 90* 93*  71* 49* 53* 58* 61*  --   --  59*  CREATININE 11.14* 10.29* 7.16* 4.53* 3.86* 3.66* 3.34*  --   --  2.79*  CALCIUM 7.0* 7.7* 8.0* 8.3* 8.7* 9.0 8.8*  --   --  8.9  MG 2.2 2.1 2.0  --   --   --   --   --   --  1.9  PHOS 6.8* 6.9* 5.9*  --   --   --   --   --   --   --    GFR: Estimated Creatinine Clearance: 42.8 mL/min (A) (by C-G formula based on SCr of 2.79 mg/dL (H)). Recent Labs  Lab 11/02/20 0129 11/03/20 0143 11/04/20 0208 11/05/20 0311 11/06/20 0416 11/06/20 1036 11/06/20 1257 11/07/20 0140 11/08/20 0330  PROCALCITON 3.65  --  1.29 0.72 0.42  --   --   --   --   WBC 9.5   < > 12.4* 13.2* 13.9*  --   --  15.1* 14.2*  LATICACIDVEN 1.3  --   --   --   --  1.5 1.1  --   --    < > = values in this interval not displayed.    Liver Function Tests: No results for input(s): AST, ALT, ALKPHOS, BILITOT, PROT, ALBUMIN in the last 168 hours. No results for input(s): LIPASE, AMYLASE in the last 168 hours. No results for input(s): AMMONIA in the last 168 hours.  ABG    Component Value Date/Time   PHART 7.510 (H) 11/08/2020 0309   PCO2ART 36.9 11/08/2020 0309   PO2ART 71 (L) 11/08/2020 0309   HCO3 29.4 (H) 11/08/2020 0309   TCO2 31 11/08/2020 0309   ACIDBASEDEF 4.8 (H) 11/02/2020 0323   O2SAT 96.0 11/08/2020 0309     Coagulation Profile: No results for input(s): INR, PROTIME in the last 168 hours.  Cardiac Enzymes: Recent Labs  Lab 11/02/20 0129 11/03/20 1231 11/05/20 0034 11/07/20 0140 11/08/20 0330  CKTOTAL 6,322* 3,027* 1,481* 540* 359    HbA1C: No results found for: HGBA1C  CBG: Recent Labs  Lab 11/07/20 1119 11/07/20 1509 11/07/20 1923 11/07/20 2319 11/08/20 0319  GLUCAP 112* 104* 111* 118* 111*    Critical Care Time: 35 minutes   11/10/20., MSN, APRN, AGACNP-BC [facility]  11/08/2020 , 8:35 AM   Please see [email or web address] for pager details  From 7a-7p if no response, please call [phone] After hours,  please call Elink at [phone]

## 2020-11-08 NOTE — Progress Notes (Signed)
Nutrition Follow-up  DOCUMENTATION CODES:   Obesity unspecified  INTERVENTION:    Boost Breeze po TID, each supplement provides 250 kcal and 9 grams of protein.  Consider liberalizing diet to regular.   NUTRITION DIAGNOSIS:   Inadequate oral intake related to inability to eat as evidenced by NPO status.  Ongoing, diet just advanced today.  GOAL:   Patient will meet greater than or equal to 90% of their needs  Progressing   MONITOR:   PO intake,Supplement acceptance,Labs,Weight trends,I & O's  REASON FOR ASSESSMENT:   Consult Enteral/tube feeding initiation and management  ASSESSMENT:   40 year old male who presented to the ED on 3/2 with chest pain x 2 weeks and developed acute hyperactive delirium. No pertinent PMH. Pt intubated for airway protection.  Extubated 3/14. TF off.  TDC being removed today. Diet advanced to clear liquids this morning. S/P SLP evaluation today; okay for regular consistencies per SLP. Currently on a renal carbohydrate modified diet.     Labs reviewed. Na 150 CBG: 111-93  Medications reviewed and include Reglan, MVI with minerals, Senokot, vitamin B-12, Precedex.  Admission weight 102.1 kg Current weight 109.8 kg I/O +14 L since admission  Diet Order:   Diet Order            Diet renal/carb modified with fluid restriction Diet-HS Snack? Nothing; Fluid restriction: 2000 mL Fluid; Room service appropriate? Yes; Fluid consistency: Thin  Diet effective now                 EDUCATION NEEDS:   No education needs have been identified at this time  Skin:  Skin Assessment: Reviewed RN Assessment  Last BM:  3/15 type 7  Height:   Ht Readings from Last 1 Encounters:  11/07/20 5\' 10"  (1.778 m)    Weight:   Wt Readings from Last 1 Encounters:  11/08/20 109.8 kg    BMI:  Body mass index is 34.73 kg/m.  Estimated Nutritional Needs:   Kcal:  2000-2200  Protein:  120-140 gm  Fluid:  >/= 2 L   11/10/20, RD, LDN,  CNSC Please refer to Amion for contact information.

## 2020-11-08 NOTE — Progress Notes (Addendum)
Physical Therapy Re-evaluation Patient Details Name: Andrew [name] MRN: [id number] DOB: 1980-11-28 Today's Date: 11/08/2020    History of Present Illness 40y/o male admitted 3/2 with two weeks of chest pain.  Pt was emergently intubated 3/2- 3/4. EEG suggested moderate, diffuse encephalopathy without seizure activity. Found to be in rhabdo with AKI. RUE pain--MRI shoulder:Edema in musculature about the shoulder. reintubated 3/6 due to hypoactive delirium for airway protection; 3/7 lumbar puncture. Extubation 3/14. PMhx: PTSD, anxiety    PT Comments    Pt admitted with above diagnosis. Patient was independent with mobility PTA. Pt currently with functional limitations due to the deficits listed below (see PT Problem List). Pt will benefit from skilled PT to increase their independence and safety with mobility to allow discharge to the venue listed below.    Patient continues with bil LE pain resulting in poor tolerance for standing and unable to walk. Tolerated squat-pivot to chair with +2 mod assist (requiring 2 person assist due to his size, # of lines, and impulsivity). Discharge plan updated as anticipate slower progress due to severity of his illness/rhabdomyolysis.     Follow Up Recommendations  CIR     Equipment Recommendations  Other (comment) (TBD)    Recommendations for Other Services       Precautions / Restrictions Precautions Precautions: Fall Restrictions Weight Bearing Restrictions: No    Mobility  Bed Mobility Overal bed mobility: Needs Assistance Bed Mobility: Supine to Sit     Supine to sit: Mod assist     General bed mobility comments: modA with use of rail and assist form PT to elevate trunk to EOB with cues to scoot to EOB. HOB elevated ~30*    Transfers Overall transfer level: Needs assistance Equipment used: 2 person hand held assist Transfers: Sit to/from [employer] Sit to Stand: Mod assist;+2 physical assistance;+2  safety/equipment;From elevated surface   Squat pivot transfers: Mod assist;+2 physical assistance;+2 safety/equipment;From elevated surface     General transfer comment: Pt with +2 hand held assist for sit to stand and pt almost immediately returned to sitting; pt impulsively performing squat pivot to recliner without taking any steps. ModA +2 for stability and for equipment/line management  Ambulation/Gait             General Gait Details: unable   Stairs             Wheelchair Mobility    Modified Rankin (Stroke Patients Only)       Balance Overall balance assessment: Needs assistance Sitting-balance support: Feet supported;No upper extremity supported Sitting balance-Leahy Scale: Fair     Standing balance support: Bilateral upper extremity supported Standing balance-Leahy Scale: Poor Standing balance comment: ModA +2 for standing balance and stability                            Cognition Arousal/Alertness: Awake/alert Behavior During Therapy: Impulsive;Flat affect Overall Cognitive Status: Impaired/Different from baseline Area of Impairment: Orientation;Following commands;Safety/judgement;Awareness;Problem solving                 Orientation Level: Disoriented to;Situation;Time Current Attention Level: Sustained   Following Commands: Follows one step commands with increased time Safety/Judgement: Decreased awareness of safety;Decreased awareness of deficits Awareness: Intellectual Problem Solving: Slow processing;Requires verbal cues;Requires tactile cues General Comments: Pt following 1 step commands, but impulsive. Pt wanting to get up abruptly, but requires cues to slow down to wait for safety when PT/OT was ready.  Exercises      General Comments General comments (skin integrity, edema, etc.): O2 >95% on RA; HR 90s BP remained high: supine at rest 165/107; sitting EOB: 181/117 after exertion seated in recliner: 165/122. RN  aware      Pertinent Vitals/Pain Pain Assessment: Faces Pain Score: 5  Faces Pain Scale: Hurts even more Pain Location: generalized Pain Descriptors / Indicators: Grimacing;Guarding;Tightness Pain Intervention(s): Limited activity within patient's tolerance;Monitored during session    Home Living Family/patient expects to be discharged to:: Private residence Living Arrangements: Spouse/significant other Available Help at Discharge: Available 24 hours/day;Friend(s) Type of Home: House Home Access: Stairs to enter   Home Layout: One level Home Equipment: None      Prior Function Level of Independence: Independent      Comments: no falls in the last year, disabled veteran   PT Goals (current goals can now be found in the care plan section) Acute Rehab PT Goals Patient Stated Goal: to go home Time For Goal Achievement: 11/15/20 Potential to Achieve Goals: Fair Progress towards PT goals: Progressing toward goals    Frequency    Min 3X/week      PT Plan Discharge plan needs to be updated    Co-evaluation PT/OT/SLP Co-Evaluation/Treatment: Yes Reason for Co-Treatment: Complexity of the patient's impairments (multi-system involvement);For patient/therapist safety;To address functional/ADL transfers PT goals addressed during session: Mobility/safety with mobility;Balance OT goals addressed during session: ADL's and self-care;Strengthening/ROM      AM-PAC PT "6 Clicks" Mobility   Outcome Measure  Help needed turning from your back to your side while in a flat bed without using bedrails?: A Lot Help needed moving from lying on your back to sitting on the side of a flat bed without using bedrails?: A Lot Help needed moving to and from a bed to a chair (including a wheelchair)?: Total Help needed standing up from a chair using your arms (e.g., wheelchair or bedside chair)?: Total Help needed to walk in hospital room?: Total Help needed climbing 3-5 steps with a railing?  : Total 6 Click Score: 8    End of Session   Activity Tolerance: Patient limited by pain;Patient limited by fatigue Patient left: in chair;with call bell/phone within reach;with chair alarm set Nurse Communication: Mobility status;Other (comment) (elevated BPs) PT Visit Diagnosis: Other abnormalities of gait and mobility (R26.89);Muscle weakness (generalized) (M62.81);Pain Pain - Right/Left:  (bil) Pain - part of body: Leg     Time: 8101-7510 PT Time Calculation (min) (ACUTE ONLY): 25 min  Charges:  $Therapeutic Activity: 8-22 mins                      Jerolyn Center, PT Pager [phone]    Zena Amos 11/08/2020, 3:30 PM

## 2020-11-08 NOTE — Progress Notes (Signed)
Patient was agitated screaming for water. Safety sitter/Nurse Tech gave patient a moistened mouth swab since current diet is NPO. Patient then reached up with safety mitts on, and forcibly grabbed the water cup out of the Nurse Tech's hand and preceded to drink the full cup. Patient has had no episodes of desaturation, and is currently at 100% on 4L/min [place]. Will continue to monitor closely.

## 2020-11-08 NOTE — Progress Notes (Signed)
Order for discontinue central line.  Per RN, order being cancelled r/t available access.  RN to request "discontinue central line" cancelled by ordering physician.

## 2020-11-09 ENCOUNTER — Inpatient Hospital Stay (HOSPITAL_COMMUNITY): Payer: Medicaid Other

## 2020-11-09 DIAGNOSIS — J9621 Acute and chronic respiratory failure with hypoxia: Principal | ICD-10-CM

## 2020-11-09 DIAGNOSIS — R410 Disorientation, unspecified: Secondary | ICD-10-CM

## 2020-11-09 DIAGNOSIS — J9601 Acute respiratory failure with hypoxia: Secondary | ICD-10-CM

## 2020-11-09 DIAGNOSIS — K567 Ileus, unspecified: Secondary | ICD-10-CM

## 2020-11-09 LAB — PROCALCITONIN: Procalcitonin: 0.71 ng/mL

## 2020-11-09 LAB — GLUCOSE, CAPILLARY
Glucose-Capillary: 100 mg/dL — ABNORMAL HIGH (ref 70–99)
Glucose-Capillary: 105 mg/dL — ABNORMAL HIGH (ref 70–99)
Glucose-Capillary: 109 mg/dL — ABNORMAL HIGH (ref 70–99)
Glucose-Capillary: 90 mg/dL (ref 70–99)
Glucose-Capillary: 96 mg/dL (ref 70–99)
Glucose-Capillary: 96 mg/dL (ref 70–99)

## 2020-11-09 LAB — BASIC METABOLIC PANEL
Anion gap: 11 (ref 5–15)
BUN: 54 mg/dL — ABNORMAL HIGH (ref 6–20)
CO2: 23 mmol/L (ref 22–32)
Calcium: 8.8 mg/dL — ABNORMAL LOW (ref 8.9–10.3)
Chloride: 110 mmol/L (ref 98–111)
Creatinine, Ser: 2.53 mg/dL — ABNORMAL HIGH (ref 0.61–1.24)
GFR, Estimated: 32 mL/min — ABNORMAL LOW (ref >60)
Glucose, Bld: 100 mg/dL — ABNORMAL HIGH (ref 70–99)
Potassium: 4.5 mmol/L (ref 3.5–5.1)
Sodium: 144 mmol/L (ref 135–145)

## 2020-11-09 LAB — POCT I-STAT 7, (LYTES, BLD GAS, ICA,H+H)
Acid-base deficit: 1 mmol/L (ref 0.0–2.0)
Bicarbonate: 24.9 mmol/L (ref 20.0–28.0)
Calcium, Ion: 1.24 mmol/L (ref 1.15–1.40)
HCT: 29 % — ABNORMAL LOW (ref 39.0–52.0)
Hemoglobin: 9.9 g/dL — ABNORMAL LOW (ref 13.0–17.0)
O2 Saturation: 100 %
Patient temperature: 100.6
Potassium: 3.8 mmol/L (ref 3.5–5.1)
Sodium: 149 mmol/L — ABNORMAL HIGH (ref 135–145)
TCO2: 26 mmol/L (ref 22–32)
pCO2 arterial: 45.3 mmHg (ref 32.0–48.0)
pH, Arterial: 7.353 (ref 7.350–7.450)
pO2, Arterial: 476 mmHg — ABNORMAL HIGH (ref 83.0–108.0)

## 2020-11-09 LAB — CBC
HCT: 27.9 % — ABNORMAL LOW (ref 39.0–52.0)
Hemoglobin: 8.9 g/dL — ABNORMAL LOW (ref 13.0–17.0)
MCH: 31.2 pg (ref 26.0–34.0)
MCHC: 31.9 g/dL (ref 30.0–36.0)
MCV: 97.9 fL (ref 80.0–100.0)
Platelets: 492 10*3/uL — ABNORMAL HIGH (ref 150–400)
RBC: 2.85 MIL/uL — ABNORMAL LOW (ref 4.22–5.81)
RDW: 14 % (ref 11.5–15.5)
WBC: 15.8 10*3/uL — ABNORMAL HIGH (ref 4.0–10.5)
nRBC: 0 % (ref 0.0–0.2)

## 2020-11-09 LAB — CK: Total CK: 338 U/L (ref 49–397)

## 2020-11-09 MED ORDER — FENTANYL BOLUS VIA INFUSION
50.0000 ug | INTRAVENOUS | Status: DC | PRN
  Administered 2020-11-09 – 2020-11-12 (×8): 50 ug via INTRAVENOUS
  Filled 2020-11-09: qty 50

## 2020-11-09 MED ORDER — PANTOPRAZOLE SODIUM 40 MG PO PACK
40.0000 mg | PACK | Freq: Every day | ORAL | Status: DC
  Administered 2020-11-09 – 2020-11-16 (×7): 40 mg
  Filled 2020-11-09 (×8): qty 20

## 2020-11-09 MED ORDER — ROCURONIUM BROMIDE 10 MG/ML (PF) SYRINGE
PREFILLED_SYRINGE | INTRAVENOUS | Status: AC
  Administered 2020-11-09: 100 mg
  Filled 2020-11-09: qty 10

## 2020-11-09 MED ORDER — DOCUSATE SODIUM 50 MG/5ML PO LIQD
100.0000 mg | Freq: Two times a day (BID) | ORAL | Status: DC
  Administered 2020-11-09 – 2020-11-15 (×8): 100 mg
  Filled 2020-11-09 (×9): qty 10

## 2020-11-09 MED ORDER — CARVEDILOL 25 MG PO TABS: 25.0000 mg | ORAL_TABLET | Freq: Two times a day (BID) | ORAL | Status: DC

## 2020-11-09 MED ORDER — VITAMIN B-12 1000 MCG PO TABS
1000.0000 ug | ORAL_TABLET | Freq: Every day | ORAL | Status: DC
  Administered 2020-11-10 – 2020-11-16 (×6): 1000 ug
  Filled 2020-11-09 (×8): qty 1

## 2020-11-09 MED ORDER — SODIUM CHLORIDE 0.9 % IV SOLN
2.0000 g | Freq: Two times a day (BID) | INTRAVENOUS | Status: DC
  Administered 2020-11-09 – 2020-11-11 (×4): 2 g via INTRAVENOUS
  Filled 2020-11-09 (×4): qty 2

## 2020-11-09 MED ORDER — APIXABAN 5 MG PO TABS: 10.0000 mg | ORAL_TABLET | Freq: Two times a day (BID) | ORAL | Status: DC

## 2020-11-09 MED ORDER — MIDAZOLAM HCL 2 MG/2ML IJ SOLN
2.0000 mg | INTRAMUSCULAR | Status: DC | PRN
  Administered 2020-11-10: 2 mg via INTRAVENOUS

## 2020-11-09 MED ORDER — PROSOURCE TF PO LIQD
45.0000 mL | Freq: Four times a day (QID) | ORAL | Status: DC
  Administered 2020-11-09 – 2020-11-16 (×22): 45 mL
  Filled 2020-11-09 (×22): qty 45

## 2020-11-09 MED ORDER — OSMOLITE 1.5 CAL PO LIQD
1000.0000 mL | ORAL | Status: DC
  Administered 2020-11-09 – 2020-11-15 (×5): 1000 mL
  Filled 2020-11-09 (×11): qty 1000

## 2020-11-09 MED ORDER — FENTANYL CITRATE (PF) 100 MCG/2ML IJ SOLN: 50.0000 ug | Freq: Once | INTRAMUSCULAR | Status: DC

## 2020-11-09 MED ORDER — MIDAZOLAM HCL 2 MG/2ML IJ SOLN
INTRAMUSCULAR | Status: AC
  Filled 2020-11-09: qty 2

## 2020-11-09 MED ORDER — ORAL CARE MOUTH RINSE
15.0000 mL | OROMUCOSAL | Status: DC
  Administered 2020-11-09 – 2020-11-17 (×69): 15 mL via OROMUCOSAL

## 2020-11-09 MED ORDER — POLYETHYLENE GLYCOL 3350 17 G PO PACK: 17.0000 g | PACK | Freq: Every day | ORAL | Status: DC | PRN

## 2020-11-09 MED ORDER — ETOMIDATE 2 MG/ML IV SOLN
INTRAVENOUS | Status: AC
  Administered 2020-11-09: 20 mg via INTRAVENOUS
  Filled 2020-11-09: qty 20

## 2020-11-09 MED ORDER — CLONIDINE HCL 0.2 MG PO TABS
0.1000 mg | ORAL_TABLET | Freq: Two times a day (BID) | ORAL | Status: DC
  Administered 2020-11-09 – 2020-11-14 (×10): 0.1 mg
  Filled 2020-11-09 (×11): qty 1

## 2020-11-09 MED ORDER — HYDRALAZINE HCL 50 MG PO TABS
50.0000 mg | ORAL_TABLET | Freq: Four times a day (QID) | ORAL | Status: DC
  Administered 2020-11-09 – 2020-11-16 (×24): 50 mg
  Filled 2020-11-09 (×25): qty 1

## 2020-11-09 MED ORDER — ENOXAPARIN SODIUM 120 MG/0.8ML ~~LOC~~ SOLN
110.0000 mg | Freq: Two times a day (BID) | SUBCUTANEOUS | Status: DC
  Administered 2020-11-09 – 2020-11-10 (×2): 110 mg via SUBCUTANEOUS
  Filled 2020-11-09 (×2): qty 0.74

## 2020-11-09 MED ORDER — FENTANYL CITRATE (PF) 100 MCG/2ML IJ SOLN
INTRAMUSCULAR | Status: AC
  Filled 2020-11-09: qty 2

## 2020-11-09 MED ORDER — FENTANYL CITRATE (PF) 100 MCG/2ML IJ SOLN
25.0000 ug | Freq: Once | INTRAMUSCULAR | Status: AC
  Administered 2020-11-09: 25 ug via INTRAVENOUS
  Filled 2020-11-09: qty 2

## 2020-11-09 MED ORDER — MIDAZOLAM HCL 2 MG/2ML IJ SOLN
4.0000 mg | Freq: Once | INTRAMUSCULAR | Status: AC
  Administered 2020-11-09: 4 mg via INTRAVENOUS
  Filled 2020-11-09: qty 4

## 2020-11-09 MED ORDER — AMLODIPINE BESYLATE 10 MG PO TABS
10.0000 mg | ORAL_TABLET | Freq: Every day | ORAL | Status: DC
  Administered 2020-11-10 – 2020-11-16 (×6): 10 mg
  Filled 2020-11-09 (×6): qty 1

## 2020-11-09 MED ORDER — AMLODIPINE BESYLATE 10 MG PO TABS
10.0000 mg | ORAL_TABLET | Freq: Every day | ORAL | Status: DC
  Filled 2020-11-09: qty 1

## 2020-11-09 MED ORDER — METOLAZONE 2.5 MG PO TABS
5.0000 mg | ORAL_TABLET | Freq: Once | ORAL | Status: AC
  Administered 2020-11-09: 5 mg via ORAL
  Filled 2020-11-09: qty 2

## 2020-11-09 MED ORDER — CHLORHEXIDINE GLUCONATE 0.12% ORAL RINSE (MEDLINE KIT)
15.0000 mL | Freq: Two times a day (BID) | OROMUCOSAL | Status: DC
  Administered 2020-11-09 – 2020-11-17 (×16): 15 mL via OROMUCOSAL

## 2020-11-09 MED ORDER — HYDRALAZINE HCL 50 MG PO TABS
50.0000 mg | ORAL_TABLET | Freq: Four times a day (QID) | ORAL | Status: DC
Start: 2020-11-09 — End: 2020-11-09
  Administered 2020-11-09: 50 mg via ORAL
  Filled 2020-11-09: qty 1

## 2020-11-09 MED ORDER — DEXMEDETOMIDINE HCL IN NACL 400 MCG/100ML IV SOLN
0.0000 ug/kg/h | INTRAVENOUS | Status: AC
  Administered 2020-11-09: 0.2 ug/kg/h via INTRAVENOUS
  Administered 2020-11-09: 0.6 ug/kg/h via INTRAVENOUS
  Administered 2020-11-10: 0.9 ug/kg/h via INTRAVENOUS
  Administered 2020-11-10 (×2): 1.2 ug/kg/h via INTRAVENOUS
  Administered 2020-11-10: 0.6 ug/kg/h via INTRAVENOUS
  Administered 2020-11-10 – 2020-11-12 (×8): 0.8 ug/kg/h via INTRAVENOUS
  Filled 2020-11-09 (×15): qty 100

## 2020-11-09 MED ORDER — APIXABAN 5 MG PO TABS: 5.0000 mg | ORAL_TABLET | Freq: Two times a day (BID) | ORAL | Status: DC

## 2020-11-09 MED ORDER — FUROSEMIDE 10 MG/ML IJ SOLN
40.0000 mg | Freq: Once | INTRAMUSCULAR | Status: AC
  Administered 2020-11-09: 40 mg via INTRAVENOUS
  Filled 2020-11-09: qty 4

## 2020-11-09 MED ORDER — HYDRALAZINE HCL 50 MG PO TABS: 25.0000 mg | ORAL_TABLET | Freq: Four times a day (QID) | ORAL | Status: DC

## 2020-11-09 MED ORDER — MIDAZOLAM HCL 2 MG/2ML IJ SOLN
2.0000 mg | INTRAMUSCULAR | Status: DC | PRN
  Filled 2020-11-09: qty 2

## 2020-11-09 MED ORDER — ACETAMINOPHEN 325 MG PO TABS
650.0000 mg | ORAL_TABLET | Freq: Four times a day (QID) | ORAL | Status: DC | PRN
  Administered 2020-11-12 – 2020-11-14 (×2): 650 mg
  Filled 2020-11-09 (×2): qty 2

## 2020-11-09 MED ORDER — POLYETHYLENE GLYCOL 3350 17 G PO PACK
17.0000 g | PACK | Freq: Every day | ORAL | Status: DC
  Administered 2020-11-10: 17 g
  Filled 2020-11-09 (×2): qty 1

## 2020-11-09 MED ORDER — CARVEDILOL 25 MG PO TABS
25.0000 mg | ORAL_TABLET | Freq: Two times a day (BID) | ORAL | Status: DC
  Administered 2020-11-09 – 2020-11-14 (×8): 25 mg
  Filled 2020-11-09 (×10): qty 1

## 2020-11-09 MED ORDER — ETOMIDATE 2 MG/ML IV SOLN: 20.0000 mg | Freq: Once | INTRAVENOUS | Status: AC

## 2020-11-09 MED ORDER — SENNA 8.6 MG PO TABS
1.0000 | ORAL_TABLET | Freq: Two times a day (BID) | ORAL | Status: DC
  Administered 2020-11-09 – 2020-11-14 (×5): 8.6 mg
  Filled 2020-11-09 (×6): qty 1

## 2020-11-09 MED ORDER — CLONIDINE HCL 0.2 MG PO TABS
0.1000 mg | ORAL_TABLET | Freq: Two times a day (BID) | ORAL | Status: DC
  Administered 2020-11-09: 0.1 mg via ORAL
  Filled 2020-11-09: qty 1

## 2020-11-09 MED ORDER — FENTANYL 2500MCG IN NS 250ML (10MCG/ML) PREMIX INFUSION
50.0000 ug/h | INTRAVENOUS | Status: DC
  Administered 2020-11-09: 50 ug/h via INTRAVENOUS
  Administered 2020-11-10 – 2020-11-12 (×5): 200 ug/h via INTRAVENOUS
  Filled 2020-11-09 (×6): qty 250

## 2020-11-09 MED ORDER — ROCURONIUM BROMIDE 50 MG/5ML IV SOLN
100.0000 mg | Freq: Once | INTRAVENOUS | Status: AC
  Filled 2020-11-09: qty 10

## 2020-11-09 MED ORDER — FENTANYL CITRATE (PF) 100 MCG/2ML IJ SOLN
200.0000 ug | Freq: Once | INTRAMUSCULAR | Status: AC
  Administered 2020-11-09: 200 ug via INTRAVENOUS
  Filled 2020-11-09: qty 4

## 2020-11-09 NOTE — Progress Notes (Signed)
NAME:  Andrew [name], MRN:  [id number], DOB:  [birth year], LOS: 13 ADMISSION DATE:  10/26/2020,   Brief History:  40 y/o male admitted 3/2 with chest pain & acute hyperactive delirium requiring mechanical ventilation.  Subsequently found to be in rhabdo with AKI requiring HD.  Concern Wellbutrin, Seroquel contributing.  UDS negative x2, CSF negative, MRI negative. Failed extubation 3/4, re-intubated 3/6, improved 3/14 and extubated successfully.  Past Medical History:  PTSD Depression  Significant Hospital Events:  3/04 extubated, AKI worse, found to be in rhabdo 3/05 Continues to have diffuse muscle pain, renal consult 3/06 increased body pain particularly right shoulder, concern for septic arthritis, MRI neg 3/13 Abd distention, cuff leak, bloody drainage around ETT  3/14 Weaning on PSV, remains on high dose sedation, extubated, delirium overnight on dex 3/15 Episode of desaturation after eating, suspected silent aspiration. Vasovagal event with ice water 3/16 Off precedex, remains on esmolol gtt / resistant HTN  Consults:  Renal Psych Neuro  Procedures:  ETT 3/3  >> 3/4 ETT 3/6 >> 3/14 HD catheter 3/9 >> 3/15  Significant Diagnostic Tests:   Head CT 3/3 >> sphenoid sinusitis  Echo 3/3 >> nml LVEF, no WMA  MRI right shoulder 3/6 >> no evidence of septic joint, no effusion, severe edema and muscles around shoulder joint consistent with rhabdo  Head CT 3/6 >> neg  CSF cell count 3/7 >> WBC 2, RBC 2 (hypocellular)  MRI brain 3/7 >> normal, paranasal sinus mucosal thickening, small mastoid effusions  BLLE Ultrasound 3/14  >> BL DVT gastrocnemius  Limited ECHO 3/15 >> EF 60, no evidence of RV strain  Micro Data:  COVID 3/3 >> negative Influenza 3/3 >> negative Resp 3/3 >> nml flora Blood 3/3 >> negative Blood 3/6 >> negative CSF 3/7 >> negative  Fecal lactoferrin 3/11 >> negative UA 3/14 >> +Ketones, Neg  Antimicrobials:  Ceftx 3/3 >> 3/6 Azithro 3/3 x  1 Cefazolin 3/6 >> 3/7 Ceftriaxone 3/7 >> 3/9 Vanc 3/6  X 1 dose Vanco 3/7 >> 3/8 Ceftriaxone 3/13 >> 3/15  Interval events:  Questionable event of aspiration with desaturation on afternoon of 3/15 Afebrile  Remains on high dose esmolol gtt, hypertensive  Tachypnea  I/O 3.4L UOP, -1.5L in last 24 hours  Objective   Blood pressure (!) 174/105, pulse (!) 102, temperature 98.5 F (36.9 C), temperature source Oral, resp. rate (!) 47, height 5\' 10"  (1.778 m), weight 111.3 kg, SpO2 96 %.        Intake/Output Summary (Last 24 hours) at 11/09/2020 0836 Last data filed at 11/09/2020 0700 Gross per 24 hour  Intake 1971.78 ml  Output 3575 ml  Net -1603.22 ml   Filed Weights   11/07/20 1000 11/08/20 0346 11/09/20 0314  Weight: 115 kg 109.8 kg 111.3 kg    Examination: General: adult male lying in bed, tachypneic HEENT: MM pink/moist, anicteric, [place] O2 PSY: flat affect Neuro: awakens to voice, follows commands, generalized weakness but no focal deficits CV: s1s2 RRR, ST 105 on monitor, no m/r/g PULM: tachypneic, mild abdominal accessory muscle use, rhonchi bilaterally, NTS with creamy secretions  GI: soft, bsx4 active Extremities: warm/dry, 1-2+ pitting edema / anasarca Skin: no rashes or lesions  Resolved problems:  Acute AG metabolic acidosis Bloody/coffee-ground NG aspirate Acute Hypoxic Respiratory Failure requiring Mechanical Ventilation     Assessment & Plan:   Acute Hypoxemic Respiratory Failure  Acute Respiratory Alkalosis Tachypnea with respiratory alkalosis with known BLLE DVT, presumed PE -wean O2 for sats >90% -  Eliquis per pharmacy  -pulmonary hygiene - IS, mobilize, chest PT   Acute Metabolic Encephalopathy with Hyperactive Delirium  Evolving neuropathy, suspected due to uremia Underlying PSY / Followed at the [facility] PTA on multiple psychoactive medications including Seroquel and Wellbutrin. UDS and head CT negative on admit, denies history of drug abuse but sister  reports suspected use, 3/5 staff notes girlfriend visiting before his deterioration in mental status. She was noted to be unsteady, suspicious for surreptitious drug use, UDS repeated 3/6 only shows benzos and opiates which are prescribed medicines. MRI brain, CSF no growth. -await CK, hold home wellbutrin / seroquel per PSY until CK <300 -monitor off precedex, avoid sedating medications  -continue B12 supplementation  -delirium prevention measures  -PT efforts / mobilize   Aspiration Pneumonitis without clear PNA Sphenoid Sinusitis Completed 7-day course antibiotics completed on 3/9.   -follow up CXR / KUB now  Leukocytosis/Fever Patient found to have BL DVT on [place]. Could be source of fever and WBC elevation. -monitor off abx -fever curve improved -monitor WBC / fever curve   Bilateral Gastrocnemius Deep Vein Thrombosis Initiated heparin drip 3/14 -appreciate pharmacy input, continue eliquis -plan for 3-6 months therapy  Hypertension Not on home agents, suspect undiagnosed. Followed at the [facility].   -increase coreg to 25mg  BID -wean esmolol gtt to off  -continue norvasc 10 mg -add hydralazine 50mg  Q6 -add clonidine 0.1mg  -await aldosterone / renin activity ratio  -add metolazone / lasix x1   Tachycardia, elevated d-dimer Consistent with suspected PE. ECHO demonstrated EF around 60% with no RV strain. -continue anticoagulation as above  -if developed hemodynamic instability, would consider tPA  AKI , Rhabdomyolysis Hypernatremia Hyperphosphatemia Cause unclear.  Question meds vs aggressively fighting/ resisting.  No overt seizure activity noted. -Appreciate Nephrology > s/o 3/16 with rec's for outpatient f/u -follow CK -Trend BMP / urinary output -Replace electrolytes as indicated -Avoid nephrotoxic agents, ensure adequate renal perfusion  Hyperglycemia -Glucose goal 140-180  -monitor, no SSI need  Ileus, diarrhea Abdomen distended but bowel sounds improving with  toleration of PO intake, stool output overnight -assess KUB -pending results of above, consider stopping reglan  -place cortrak with concerns for possible aspiration / med needs  -minimize narcotics  -mobilize  Anemia due to critical illness, dilution -trend CBC -transfuse per protocol   Best practice (evaluated daily)  Diet: NPO, cortrak Pain/Anxiety/Delirium protocol (if indicated): n/a VAP protocol (if indicated): n/a DVT prophylaxis: Eliquis  GI prophylaxis: protonix Glucose control: n/a Mobility: bedrest Disposition: ICU Family: Father, , updated via phone 3/16 on plan of care.   Goals of Care:  Code Status: Full Code   Labs   CBC: Recent Labs  Lab 11/05/20 0311 11/06/20 0416 11/07/20 0140 11/08/20 0309 11/08/20 0330 11/09/20 0237  WBC 13.2* 13.9* 15.1*  --  14.2* 15.8*  HGB 8.5* 8.6* 8.7* 8.8* 8.6* 8.9*  HCT 27.0* 27.4* 27.1* 26.0* 27.5* 27.9*  MCV 96.8 97.5 96.8  --  97.5 97.9  PLT 306 335 382  --  407* 492*    Basic Metabolic Panel: Recent Labs  Lab 11/03/20 0143 11/04/20 0208 11/05/20 0311 11/06/20 0416 11/06/20 1024 11/07/20 0140 11/07/20 0818 11/08/20 0309 11/08/20 0330 11/09/20 0237  NA 141 141   < > 145 144 146*  --  154* 150* 144  K 4.3 3.9   < > 5.0 5.3* 4.7 4.0 4.0 4.0 4.5  CL 105 107   < > 111 109 109  --   --  112* 110  CO2 20* 22   < > 28 24 23   --   --  28 23  GLUCOSE 132* 154*   < > 115* 138* 104*  --   --  128* 100*  BUN 93* 71*   < > 53* 58* 61*  --   --  59* 54*  CREATININE 10.29* 7.16*   < > 3.86* 3.66* 3.34*  --   --  2.79* 2.53*  CALCIUM 7.7* 8.0*   < > 8.7* 9.0 8.8*  --   --  8.9 8.8*  MG 2.1 2.0  --   --   --   --   --   --  1.9  --   PHOS 6.9* 5.9*  --   --   --   --   --   --   --   --    < > = values in this interval not displayed.   GFR: Estimated Creatinine Clearance: 47.5 mL/min (A) (by C-G formula based on SCr of 2.53 mg/dL (H)). Recent Labs  Lab 11/04/20 0208 11/05/20 0311 11/06/20 0416 11/06/20 1036  11/06/20 1257 11/07/20 0140 11/08/20 0330 11/09/20 0237  PROCALCITON 1.29 0.72 0.42  --   --   --   --   --   WBC 12.4* 13.2* 13.9*  --   --  15.1* 14.2* 15.8*  LATICACIDVEN  --   --   --  1.5 1.1  --   --   --     Liver Function Tests: No results for input(s): AST, ALT, ALKPHOS, BILITOT, PROT, ALBUMIN in the last 168 hours. No results for input(s): LIPASE, AMYLASE in the last 168 hours. No results for input(s): AMMONIA in the last 168 hours.  ABG    Component Value Date/Time   PHART 7.510 (H) 11/08/2020 0309   PCO2ART 36.9 11/08/2020 0309   PO2ART 71 (L) 11/08/2020 0309   HCO3 29.4 (H) 11/08/2020 0309   TCO2 31 11/08/2020 0309   ACIDBASEDEF 4.8 (H) 11/02/2020 0323   O2SAT 96.0 11/08/2020 0309     Coagulation Profile: No results for input(s): INR, PROTIME in the last 168 hours.  Cardiac Enzymes: Recent Labs  Lab 11/03/20 1231 11/05/20 0034 11/07/20 0140 11/08/20 0330  CKTOTAL 3,027* 1,481* 540* 359    HbA1C: No results found for: HGBA1C  CBG: Recent Labs  Lab 11/08/20 1615 11/08/20 1933 11/08/20 2330 11/09/20 0329 11/09/20 0755  GLUCAP 104* 110* 96 96 90    Critical Care Time: 36 minutes    11/11/20, MSN, APRN, NP-C, AGACNP-BC  Pulmonary & Critical Care 11/09/2020, 9:23 AM   Please see [email or web address] for pager details.   From 7A-7P if no response, please call [phone] After hours, please call ELink [phone]

## 2020-11-09 NOTE — Progress Notes (Signed)
Pharmacy Antibiotic Note  Andrew [name] is a 40 y.o. male admitted on 10/26/2020 with pneumonia.  Pharmacy has been consulted for cefepime dosing. SCr down to 2.53.  Plan: Cefepime 2g IV q12h  Monitor clinical progress, c/s, renal function F/u de-escalation plan/LOT   Height: 5\' 10"  (177.8 cm) Weight: 111.3 kg (245 lb 6 oz) IBW/kg (Calculated) : 73  Temp (24hrs), Avg:98.8 F (37.1 C), Min:97.6 F (36.4 C), Max:100.6 F (38.1 C)  Recent Labs  Lab 11/05/20 0311 11/06/20 0416 11/06/20 1024 11/06/20 1036 11/06/20 1257 11/07/20 0140 11/08/20 0330 11/09/20 0237  WBC 13.2* 13.9*  --   --   --  15.1* 14.2* 15.8*  CREATININE 4.53* 3.86* 3.66*  --   --  3.34* 2.79* 2.53*  LATICACIDVEN  --   --   --  1.5 1.1  --   --   --     Estimated Creatinine Clearance: 47.5 mL/min (A) (by C-G formula based on SCr of 2.53 mg/dL (H)).    No Known Allergies   11/11/20, PharmD, BCPS Please check AMION for all [facility] Pharmacy contact numbers Clinical Pharmacist 11/09/2020 3:57 PM

## 2020-11-09 NOTE — Progress Notes (Signed)
Nutrition Follow-up  DOCUMENTATION CODES:   Obesity unspecified  INTERVENTION:   Initiate tube feeding via Cortrak: Osmolite 1.5 at 55 ml/h (1320 ml per day) Prosource TF 45 ml QID  Provides 2140 kcal, 127 gm protein, 1006 ml free water daily  NUTRITION DIAGNOSIS:   Inadequate oral intake related to inability to eat as evidenced by NPO status.  Ongoing  GOAL:   Patient will meet greater than or equal to 90% of their needs  Progressing   MONITOR:   TF tolerance,Labs,Diet advancement  REASON FOR ASSESSMENT:   Consult Enteral/tube feeding initiation and management  ASSESSMENT:   40 year old male who presented to the ED on 3/2 with chest pain x 2 weeks and developed acute hyperactive delirium. No pertinent PMH. Pt intubated for airway protection.  Patient was started on a renal carbohydrate modified diet 3/15. Suspected aspiration event 3/15 afternoon, so patient was made NPO. Will likely need a MBSS with SLP when appropriate.  Cortrak tube has been ordered. RD to order TF per discussion with CCM.  No evidence of bowel obstruction or free air found on abdominal film this morning.  Renal function improving. UOP: 3,250 ml x 24 hours Nephrology signed off.   Labs reviewed.  CBG: 96-90  Medications reviewed and include Reglan, MVI with minerals, Senokot, vitamin B-12.  Admission weight 102.1 kg Lowest weight since admission 102 kg Current weight 111.3 kg I/O +12.7 L since admission Per RN documentation, patient has mild pitting generalized edema.   Diet Order:   Diet Order            Diet NPO time specified Except for: Sips with Meds  Diet effective now                 EDUCATION NEEDS:   No education needs have been identified at this time  Skin:  Skin Assessment: Reviewed RN Assessment  Last BM:  3/15 type 7  Height:   Ht Readings from Last 1 Encounters:  11/07/20 5\' 10"  (1.778 m)    Weight:   Wt Readings from Last 1 Encounters:   11/09/20 111.3 kg    BMI:  Body mass index is 35.21 kg/m.  Estimated Nutritional Needs:   Kcal:  2000-2200  Protein:  120-140 gm  Fluid:  >/= 2 L   11/11/20, RD, LDN, CNSC Please refer to Amion for contact information.

## 2020-11-09 NOTE — Progress Notes (Signed)
ANTICOAGULATION CONSULT NOTE  Pharmacy Consult for Lovenox Indication: pulmonary embolus  Labs: Recent Labs    11/07/20 0140 11/07/20 1942 11/08/20 0309 11/08/20 0330 11/09/20 0237 11/09/20 0915  HGB 8.7*  --  8.8* 8.6* 8.9*  --   HCT 27.1*  --  26.0* 27.5* 27.9*  --   PLT 382  --   --  407* 492*  --   HEPARINUNFRC  --  0.73*  --  0.66  --   --   CREATININE 3.34*  --   --  2.79* 2.53*  --   CKTOTAL 540*  --   --  359  --  338    Assessment: 40 yom with acute DVT, likely PE on 3/13 (CTA not performed due to renal dysfunction), transitioned from heparin drip to apixaban. Now patient re-intubated with plan for trach placement. Pharmacy consulted to transition apixaban to Lovenox in anticipation of procedure. CBC stable. SCr down to 2.53, CrCl >30. No active bleed issues reported.  Goal of Therapy:  Anti-Xa level 0.6-1 units/ml 4hrs after LMWH dose given Monitor platelets by anticoagulation protocol: Yes   Plan:  D/c apixaban in anticipation of trach procedure Start Lovenox 110 (1mg /kg) [place] q12h at time next apixaban dose would have been due Monitor CBC at least q72h, SCr, s/sx bleeding F/u plans for trach procedure scheduling   , PharmD, BCPS Clinical Pharmacist 11/09/2020 4:42 PM

## 2020-11-09 NOTE — Progress Notes (Signed)
IVT consulted for PIV assessment.  Pt has had multiple PIV's during current visit.  RN, Harrold Donath to discuss central line w/MD.

## 2020-11-09 NOTE — Progress Notes (Addendum)
Patient ID: Andrew [name], male   DOB: Mar 31, 1981, 40 y.o.   MRN: [id number] [name] Progress Note   Assessment/ Plan:   1. Acute kidney Injury: Secondary to rhabdomyolysis/pigment nephropathy; continues to maintain excellent urine output overnight with gradually improving renal function.  Discontinue hemodialysis catheter when mental status allows for safe/reliable placement of peripheral IV (currently using HD catheter pigtail for IV access). 2.  Hypertension: Blood pressures remain elevated on the current regimen; monitor for transition to oral antihypertensive therapy based on mental status. 3.  Ventilator dependent respiratory failure: Secondary to generalized weakness/acute hyperactive delirium. 4.  Hypernatremia: This is secondary to polyuria/impaired free water intake-currently improving; encourage oral intake. 5.  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  With robust urine output and improving renal function, renal service will sign off and remain available for questions or concerns.  Subjective:   Remains off Precedex overnight and appears to do well with bedside sitter/redirection.   Objective:   BP (!) 173/97   Pulse (!) 106   Temp 97.9 F (36.6 C) (Oral)   Resp (!) 39   Ht 5\' 10"  (1.778 m)   Wt 111.3 kg   SpO2 96%   BMI 35.21 kg/m   Intake/Output Summary (Last 24 hours) at 11/09/2020 0650 Last data filed at 11/09/2020 11/11/2020 Gross per 24 hour  Intake 1995.94 ml  Output 3575 ml  Net -1579.06 ml   Weight change: -3.7 kg  Physical Exam: Gen: Currently comfortable resting in bed-awakens to conversation CVS: Pulse regular tachycardia, S1 and S2 normal Resp: Anteriorly clear to auscultation, no rales/rhonchi Abd: Soft, obese, nontender, bowel sounds normal Ext: Trace-1+ pitting lower extremity edema.  Imaging: DG CHEST PORT 1 VIEW  Result Date:  11/08/2020 CLINICAL DATA:  Hypoxia. EXAM: PORTABLE CHEST 1 VIEW COMPARISON:  11/06/2020. FINDINGS: Right IJ catheter tip is in the projection of the SVC. Heart size appears normal. Progressive bilateral interstitial and airspace opacities are identified. No pleural effusion. No pneumothorax. IMPRESSION: Progressive bilateral interstitial and airspace opacities compatible with multifocal infection. Electronically Signed   By: 11/08/2020 M.D.   On: 11/08/2020 17:46   VAS 11/10/2020 LOWER EXTREMITY VENOUS (DVT)  Result Date: 11/08/2020  Lower Venous DVT Study Indications: Edema, and Suspected PE.  Risk Factors: Rhabdomyolysis. Comparison Study: No previous exams. Performing Technologist: 11/10/2020  Examination Guidelines: A complete evaluation includes B-mode imaging, spectral Doppler, color Doppler, and power Doppler as needed of all accessible portions of each vessel. Bilateral testing is considered an integral part of a complete examination. Limited examinations for reoccurring indications may be performed as noted. The reflux portion of the exam is performed with the patient in reverse Trendelenburg.  +---------+---------------+---------+-----------+----------+-------------------+ RIGHT    CompressibilityPhasicitySpontaneityPropertiesThrombus Aging      +---------+---------------+---------+-----------+----------+-------------------+ CFV      Full           Yes      Yes                                      +---------+---------------+---------+-----------+----------+-------------------+ SFJ      Full                                                             +---------+---------------+---------+-----------+----------+-------------------+  FV Prox  Full           Yes      Yes                                      +---------+---------------+---------+-----------+----------+-------------------+ FV Mid   Full           Yes      Yes                                       +---------+---------------+---------+-----------+----------+-------------------+ FV DistalFull           Yes      Yes                                      +---------+---------------+---------+-----------+----------+-------------------+ PFV      Full                                                             +---------+---------------+---------+-----------+----------+-------------------+ POP      Full           Yes      Yes                                      +---------+---------------+---------+-----------+----------+-------------------+ PTV      Full                                                             +---------+---------------+---------+-----------+----------+-------------------+ PERO     Full                                                             +---------+---------------+---------+-----------+----------+-------------------+ Gastroc  Partial        No       No                   Acute - one of                                                            paired gastroc      +---------+---------------+---------+-----------+----------+-------------------+   +---------+---------------+---------+-----------+----------+--------------+ LEFT     CompressibilityPhasicitySpontaneityPropertiesThrombus Aging +---------+---------------+---------+-----------+----------+--------------+ CFV      Full           Yes      Yes                                 +---------+---------------+---------+-----------+----------+--------------+  SFJ      Full                                                        +---------+---------------+---------+-----------+----------+--------------+ FV Prox  Full           Yes      Yes                                 +---------+---------------+---------+-----------+----------+--------------+ FV Mid   Full           Yes      Yes                                  +---------+---------------+---------+-----------+----------+--------------+ FV DistalFull           Yes      Yes                                 +---------+---------------+---------+-----------+----------+--------------+ PFV      Full                                                        +---------+---------------+---------+-----------+----------+--------------+ POP      Full           Yes      Yes                                 +---------+---------------+---------+-----------+----------+--------------+ PTV      Full                                                        +---------+---------------+---------+-----------+----------+--------------+ PERO     Full                                                        +---------+---------------+---------+-----------+----------+--------------+ Gastroc  None           No       No                   Acute          +---------+---------------+---------+-----------+----------+--------------+     Summary: BILATERAL: -No evidence of popliteal cyst, bilaterally. RIGHT: - Findings consistent with acute deep vein thrombosis involving one of the paired right gastrocnemius veins.  LEFT: - Findings consistent with acute deep vein thrombosis involving the left gastrocnemius veins.  *See table(s) above for measurements and observations. Electronically signed by Waverly Ferrarihristopher Dickson MD on 11/08/2020 at 12:37:47 PM.    Final    ECHOCARDIOGRAM LIMITED  Result Date: 11/07/2020    ECHOCARDIOGRAM  LIMITED REPORT   Patient Name:   Andrew [name] Date of Exam: 11/07/2020 Medical Rec #:  [id number]          Height:       70.0 in Accession #:    [id number]         Weight:       253.5 lb Date of Birth:  February 06, 1981           BSA:          2.309 m Patient Age:    40 years           BP:           186/101 mmHg Patient Gender: M                  HR:           128 bpm. Exam Location:  Inpatient Procedure: Cardiac Doppler and Color Doppler Indications:     Pulmonary embolus  History:        Patient has prior history of Echocardiogram examinations, most                 recent 10/27/2020. Signs/Symptoms:Chest Pain; Risk                 Factors:Hypertension. Acute bilateral DVT.  Sonographer:    Ross Ludwig RDCS (AE) Referring Phys: [id number] Steffanie Dunn IMPRESSIONS  1. Left ventricular ejection fraction, by estimation, is 60 to 65%. The left ventricle has normal function. The left ventricle has no regional wall motion abnormalities.  2. Pt has acute bilateral DVT. There is no evidence of RV strain or enlargement by echo to suggest a large pulmonary embolus. Suggest alternative imaging for pulmonary embolus if not already performed. I note that his creatinine is elevated. Consider VQ  scan .Marland Kitchen Right ventricular systolic function is normal. The right ventricular size is normal.  3. The mitral valve is normal in structure. No evidence of mitral valve regurgitation. No evidence of mitral stenosis.  4. The aortic valve is normal in structure. Aortic valve regurgitation is not visualized. No aortic stenosis is present. FINDINGS  Left Ventricle: Left ventricular ejection fraction, by estimation, is 60 to 65%. The left ventricle has normal function. The left ventricle has no regional wall motion abnormalities. The left ventricular internal cavity size was normal in size. Right Ventricle: Pt has acute bilateral DVT. There is no evidence of RV strain or enlargement by echo to suggest a large pulmonary embolus. Suggest alternative imaging for pulmonary embolus if not already performed. I note that his creatinine is elevated. Consider VQ scan. The right ventricular size is normal. Right ventricular systolic function is normal. Mitral Valve: The mitral valve is normal in structure. No evidence of mitral valve stenosis. Aortic Valve: The aortic valve is normal in structure. Aortic valve regurgitation is not visualized. No aortic stenosis is present. LEFT VENTRICLE PLAX 2D LVOT diam:      2.10 cm LVOT Area:     3.46 cm  RIGHT VENTRICLE         IVC TAPSE (M-mode): 2.8 cm  IVC diam: 2.30 cm  AORTA Ao Root diam: 3.30 cm  SHUNTS Systemic Diam: 2.10 cm Kristeen Miss MD Electronically signed by Kristeen Miss MD Signature Date/Time: 11/07/2020/5:23:17 PM    Final     Labs: BMET Recent Labs  Lab 11/03/20 6644 11/04/20 0347 11/05/20 4259 11/06/20 5638 11/06/20 1024 11/07/20 0140 11/07/20 0818 11/08/20 0309 11/08/20 0330 11/09/20 7564  NA 141 141 141 145 144 146*  --  154* 150* 144  K 4.3 3.9 4.0 5.0 5.3* 4.7 4.0 4.0 4.0 4.5  CL 105 107 107 111 109 109  --   --  112* 110  CO2 20* 22 26 28 24 23   --   --  28 23  GLUCOSE 132* 154* 146* 115* 138* 104*  --   --  128* 100*  BUN 93* 71* 49* 53* 58* 61*  --   --  59* 54*  CREATININE 10.29* 7.16* 4.53* 3.86* 3.66* 3.34*  --   --  2.79* 2.53*  CALCIUM 7.7* 8.0* 8.3* 8.7* 9.0 8.8*  --   --  8.9 8.8*  PHOS 6.9* 5.9*  --   --   --   --   --   --   --   --    CBC Recent Labs  Lab 11/06/20 0416 11/07/20 0140 11/08/20 0309 11/08/20 0330 11/09/20 0237  WBC 13.9* 15.1*  --  14.2* 15.8*  HGB 8.6* 8.7* 8.8* 8.6* 8.9*  HCT 27.4* 27.1* 26.0* 27.5* 27.9*  MCV 97.5 96.8  --  97.5 97.9  PLT 335 382  --  407* 492*    Medications:    . amLODipine  10 mg Oral Daily  . apixaban  10 mg Oral BID   Followed by  . [START ON 11/15/2020] apixaban  5 mg Oral BID  . carvedilol  12.5 mg Oral BID WC  . Chlorhexidine Gluconate Cloth  6 each Topical Q0600  . Chlorhexidine Gluconate Cloth  6 each Topical Q0600  . feeding supplement  1 Container Oral TID BM  . mouth rinse  15 mL Mouth Rinse BID  . metoCLOPramide (REGLAN) injection  5 mg Intravenous Q8H  . multivitamin with minerals  1 tablet Oral Daily  . pantoprazole  40 mg Oral Daily  . senna  1 tablet Oral BID  . sodium chloride flush  10-40 mL Intracatheter Q12H  . vitamin B-12  1,000 mcg Oral Daily   11/17/2020, MD 11/09/2020, 6:50 AM

## 2020-11-09 NOTE — Procedures (Signed)
Cortrak  Person Inserting Tube:  Andrew [name], RD Tube Type:  Cortrak - 43 inches Tube Location:  Left nare Initial Placement:  Stomach Secured by: Bridle Technique Used to Measure Tube Placement:  Documented cm marking at nare/ corner of mouth Cortrak Secured At:  73 cm Procedure Comments:  Cortrak Tube Team Note:  Consult received to place a Cortrak feeding tube.   No x-ray is required. RN may begin using tube.    If the tube becomes dislodged please keep the tube and contact the Cortrak team at [email or web address] (password [email or web address]) for replacement.  If after hours and replacement cannot be delayed, place a NG tube and confirm placement with an abdominal x-ray.      Andrew Cullifer, MS, RD, LDN, CNSC Inpatient Clinical Dietitian RD pager # available in AMION  After hours/weekend pager # available in AMION      

## 2020-11-09 NOTE — Progress Notes (Signed)
eLink Physician-Brief Progress Note Patient Name: Andrew [name] DOB: December 09, 1980 MRN: [id number]   Date of Service  11/09/2020  HPI/Events of Note  Patient needs sitter order renewed for patient safety.  eICU Interventions  Sitter order renewed.        Andrew [name] 11/09/2020, 1:57 AM

## 2020-11-09 NOTE — Procedures (Signed)
Intubation Procedure Note  Andrew [name]  [id number]  May 03, 1981  Date:11/09/20  Time:3:41 PM   Provider Performing:Glennis Borger Kathie Rhodes Greggory Stallion    Procedure: Intubation (31500)  Indication(s) Respiratory Failure  Consent Risks of the procedure as well as the alternatives and risks of each were explained to the patient and/or caregiver.  Consent for the procedure was obtained and is signed in the bedside chart   Anesthesia Etomidate, Versed and Rocuronium   Time Out Verified patient identification, verified procedure, site/side was marked, verified correct patient position, special equipment/implants available, medications/allergies/relevant history reviewed, required imaging and test results available.   Sterile Technique Usual hand hygeine, masks, and gloves were used   Procedure Description Patient positioned in bed supine.  Sedation given as noted above.  Patient was intubated with endotracheal tube using Glidescope.  View was Grade 1 full glottis .  Number of attempts was 1.  Colorimetric CO2 detector was consistent with tracheal placement.   Complications/Tolerance None; patient tolerated the procedure well. Chest X-ray is ordered to verify placement.   EBL none   Specimen(s) None    Gershon Mussel., MSN, APRN, AGACNP-BC [facility]  11/09/2020 , 3:44 PM   Please see [email or web address] for pager details  From 7a-7p if no response, please call [phone] After hours, please call Elink at [phone]

## 2020-11-09 NOTE — Progress Notes (Signed)
Reexamined at bedside. RR in low 50s, abdominal accessory muscle use. Did not respond to a dose of fentanyl to assess for possible opiate withdraw precipitating symptoms. Decision made to reintubate. His father was at bedside and we discussed that at this point we likely need to progress to a tracheostomy in the next few days to give him a longer vent wean. He understands that this is the safest way to proceed given his recurrent episodes of severe respiratory failure. Intubated uneventfully.   Steffanie Dunn, DO 11/09/20 3:32 PM [facility]

## 2020-11-10 ENCOUNTER — Inpatient Hospital Stay (HOSPITAL_COMMUNITY): Payer: Medicaid Other

## 2020-11-10 LAB — CBC
HCT: 27.9 % — ABNORMAL LOW (ref 39.0–52.0)
Hemoglobin: 8.9 g/dL — ABNORMAL LOW (ref 13.0–17.0)
MCH: 31 pg (ref 26.0–34.0)
MCHC: 31.9 g/dL (ref 30.0–36.0)
MCV: 97.2 fL (ref 80.0–100.0)
Platelets: 504 10*3/uL — ABNORMAL HIGH (ref 150–400)
RBC: 2.87 MIL/uL — ABNORMAL LOW (ref 4.22–5.81)
RDW: 13.3 % (ref 11.5–15.5)
WBC: 11.5 10*3/uL — ABNORMAL HIGH (ref 4.0–10.5)
nRBC: 0 % (ref 0.0–0.2)

## 2020-11-10 LAB — BASIC METABOLIC PANEL
Anion gap: 10 (ref 5–15)
BUN: 52 mg/dL — ABNORMAL HIGH (ref 6–20)
CO2: 23 mmol/L (ref 22–32)
Calcium: 8.5 mg/dL — ABNORMAL LOW (ref 8.9–10.3)
Chloride: 113 mmol/L — ABNORMAL HIGH (ref 98–111)
Creatinine, Ser: 2.28 mg/dL — ABNORMAL HIGH (ref 0.61–1.24)
GFR, Estimated: 36 mL/min — ABNORMAL LOW (ref >60)
Glucose, Bld: 129 mg/dL — ABNORMAL HIGH (ref 70–99)
Potassium: 4.5 mmol/L (ref 3.5–5.1)
Sodium: 146 mmol/L — ABNORMAL HIGH (ref 135–145)

## 2020-11-10 LAB — GLUCOSE, CAPILLARY
Glucose-Capillary: 107 mg/dL — ABNORMAL HIGH (ref 70–99)
Glucose-Capillary: 116 mg/dL — ABNORMAL HIGH (ref 70–99)
Glucose-Capillary: 118 mg/dL — ABNORMAL HIGH (ref 70–99)
Glucose-Capillary: 121 mg/dL — ABNORMAL HIGH (ref 70–99)
Glucose-Capillary: 125 mg/dL — ABNORMAL HIGH (ref 70–99)
Glucose-Capillary: 129 mg/dL — ABNORMAL HIGH (ref 70–99)

## 2020-11-10 LAB — CK: Total CK: 211 U/L (ref 49–397)

## 2020-11-10 LAB — PROCALCITONIN: Procalcitonin: 0.49 ng/mL

## 2020-11-10 LAB — MRSA PCR SCREENING: MRSA by PCR: NEGATIVE

## 2020-11-10 MED ORDER — ENOXAPARIN SODIUM 120 MG/0.8ML ~~LOC~~ SOLN: 110.0000 mg | Freq: Two times a day (BID) | SUBCUTANEOUS | Status: DC

## 2020-11-10 MED ORDER — MIDAZOLAM HCL 2 MG/2ML IJ SOLN
2.0000 mg | Freq: Once | INTRAMUSCULAR | Status: DC
  Filled 2020-11-10 (×2): qty 2

## 2020-11-10 MED ORDER — FENTANYL CITRATE (PF) 100 MCG/2ML IJ SOLN
200.0000 ug | Freq: Once | INTRAMUSCULAR | Status: AC
  Administered 2020-11-11: 200 ug via INTRAVENOUS
  Filled 2020-11-10: qty 4

## 2020-11-10 MED ORDER — VECURONIUM BROMIDE 10 MG IV SOLR
10.0000 mg | Freq: Once | INTRAVENOUS | Status: AC
  Administered 2020-11-11: 8 mg via INTRAVENOUS
  Filled 2020-11-10: qty 10

## 2020-11-10 MED ORDER — PROPOFOL 10 MG/ML IV BOLUS
500.0000 mg | Freq: Once | INTRAVENOUS | Status: AC
  Administered 2020-11-11: 30 mg via INTRAVENOUS
  Filled 2020-11-10: qty 60

## 2020-11-10 MED ORDER — MIDAZOLAM HCL 2 MG/2ML IJ SOLN
5.0000 mg | Freq: Once | INTRAMUSCULAR | Status: AC
  Administered 2020-11-11: 5 mg via INTRAVENOUS
  Filled 2020-11-10: qty 6

## 2020-11-10 MED ORDER — BUPROPION HCL 100 MG PO TABS
50.0000 mg | ORAL_TABLET | Freq: Two times a day (BID) | ORAL | Status: DC
  Administered 2020-11-10 – 2020-11-15 (×10): 50 mg
  Filled 2020-11-10 (×12): qty 0.5

## 2020-11-10 MED ORDER — FREE WATER
100.0000 mL | Freq: Four times a day (QID) | Status: DC
  Administered 2020-11-10 (×2): 100 mL

## 2020-11-10 MED ORDER — ETOMIDATE 2 MG/ML IV SOLN
40.0000 mg | Freq: Once | INTRAVENOUS | Status: AC
  Administered 2020-11-11: 20 mg via INTRAVENOUS
  Filled 2020-11-10: qty 20

## 2020-11-10 MED ORDER — QUETIAPINE FUMARATE 100 MG PO TABS
100.0000 mg | ORAL_TABLET | Freq: Every day | ORAL | Status: DC
  Administered 2020-11-10 – 2020-11-14 (×5): 100 mg
  Filled 2020-11-10 (×5): qty 1

## 2020-11-10 NOTE — Progress Notes (Addendum)
NAME:  Andrew [name], MRN:  [id number], DOB:  [birth year], LOS: 14 ADMISSION DATE:  10/26/2020,   Brief History:  40 y/o male admitted 3/2 with chest pain & acute hyperactive delirium requiring mechanical ventilation.  Subsequently found to be in rhabdo with AKI requiring HD.  Concern Wellbutrin, Seroquel contributing.  UDS negative x2, CSF negative, MRI negative. Extubated on 3/14 and re-intubated for the third time 3/16.  Past Medical History:  PTSD Depression  Significant Hospital Events:  3/04 extubated, AKI worse, found to be in rhabdo 3/05 Continues to have diffuse muscle pain, renal consult 3/06 increased body pain particularly right shoulder, concern for septic arthritis, MRI neg 3/13 Abd distention, cuff leak, bloody drainage around ETT  3/14 Weaning on PSV, remains on high dose sedation, extubated, delirium overnight on dex 3/15 Episode of desaturation after eating, suspected silent aspiration. Vasovagal event with ice water 3/16 Re-intubated, remains on esmolol gtt / resistant HTN  Consults:  Renal Psych Neuro  Procedures:  ETT 3/3  >> 3/4 ETT 3/6 >> 3/14 HD catheter 3/9 >> 3/15 ETT 3/16 >>  Significant Diagnostic Tests:   Head CT 3/3 >> sphenoid sinusitis  Echo 3/3 >> nml LVEF, no WMA  MRI right shoulder 3/6 >> no evidence of septic joint, no effusion, severe edema and muscles around shoulder joint consistent with rhabdo  Head CT 3/6 >> neg  CSF cell count 3/7 >> WBC 2, RBC 2 (hypocellular)  MRI brain 3/7 >> normal, paranasal sinus mucosal thickening, small mastoid effusions  BLLE Ultrasound 3/14  >> BL DVT gastrocnemius  Limited ECHO 3/15 >> EF 60, no evidence of RV strain  Chest CT >>  Micro Data:  COVID 3/3 >> negative Influenza 3/3 >> negative Resp 3/3 >> nml flora Blood 3/3 >> negative Blood 3/6 >> negative CSF 3/7 >> negative  Fecal lactoferrin 3/11 >> negative UA 3/14 >> +Ketones, Neg MRSA swab 3/17 >> Trach asp 3/17  >>  Antimicrobials:  Ceftx 3/3 >> 3/6 Azithro 3/3 x 1 Cefazolin 3/6 >> 3/7 Ceftriaxone 3/7 >> 3/9 Vanc 3/6  X 1 dose Vanco 3/7 >> 3/8 Ceftriaxone 3/13 >> 3/15 Cefepime 3/16 >>  Interval events:   Possible aspiration event on 3/15 Yesterday (3/16) tachypnic in the 50's for the morning, parodoxical breathing, reintubated Cefepime started, MRSA swab sennt Weaned esmolol drip, added more antihypertensive agents Diuresed with lasix and added metolazone, 5L UOP, -2677 in last 24 hours  Objective   Blood pressure 130/79, pulse 81, temperature 98.8 F (37.1 C), temperature source Oral, resp. rate 16, height 5\' 10"  (1.778 m), weight 105.6 kg, SpO2 98 %.    Vent Mode: PRVC FiO2 (%):  [30 %-100 %] 30 % Set Rate:  [15 bmp] 15 bmp Vt Set:  [580 mL] 580 mL PEEP:  [5 cmH20] 5 cmH20 Plateau Pressure:  [18 cmH20-22 cmH20] 22 cmH20   Intake/Output Summary (Last 24 hours) at 11/10/2020 0836 Last data filed at 11/10/2020 0700 Gross per 24 hour  Intake 2706.29 ml  Output 5450 ml  Net -2743.71 ml   Filed Weights   11/08/20 0346 11/09/20 0314 11/10/20 0500  Weight: 109.8 kg 111.3 kg 105.6 kg    Examination: General: in bed, appears comfortable, unlabored, mildly interactive with staff HEENT: MM pink & moist, trachea midline, right IJ HD cath, clean site, Left ND tube, ETT tube Neuro: Rass -1, GCS 10T, eyes open to voice, PERRL, MAE CV: s1s2, NSR, no m/r/g appreciated, cap refill x<3 PULM: Coarse lung sounds, symmetrical  chest expansion, moderate white/yellow sputum GI: Mild distention, decreased bowel sounds x4, grimace on abdominal palpation,  flexiseal in place Extremities: warm, skin dry, 1+ pretibial edema  Skin: no rashes or lesions  CXR personally reviewed- improvement of aeration in upper lobes, suspicious RML hampton's hump, unsure if infiltrate vs edema  Resolved problems:  Acute AG metabolic acidosis Bloody/coffee-ground NG aspirate Acute Hypoxic Respiratory Failure  requiring Mechanical Ventilation     Assessment & Plan:   Acute Hypoxemic Respiratory Failure Requiring Mechanical Ventilation Tachypnea with paradoxical inspiratory effort, known BLLE DVT, presumed PE, concern for aspiration. CO2 45 on ABG 1 HR post intubation, suspected hypercarbia prior to intervention - Continue PRVC with TV of 94ml/kg today - Wean O2 with goal sat of >90% - Non-con chest CT to evaluate pulmonary process - VAP protocol - PAD protocol, minimize sedation as able, goal RASS 0 to -1 - Mobilize with PT/OT - Eliquis switched to Lovenox with consideration of possible tracheostomy requirement - Unsure if psychiatric component to respiratory failure requiring intubation, will follow up after evaluation of chest ct and culture results.   Acute Metabolic Encephalopathy with Hypoactive Delirium  Evolving neuropathy, suspected due to uremia Underlying PSY / Followed at the [facility]  PTA on multiple psychoactive medications including Seroquel and Wellbutrin. UDS and head CT negative on admit, denies history of drug abuse but sister reports suspected use, 3/5 staff notes girlfriend visiting before his deterioration in mental status. She was noted to be unsteady, suspicious for surreptitious drug use, UDS repeated 3/6 only shows benzos and opiates which are prescribed medicines. MRI brain, CSF no growth.  - CK 221 today, will start low dose seroquel and wellbutrin now per neph recommendations - Wean precedex and fentanyl with goal of RASS 0 to -1 - Cont B12 supplementation - Delirium prevention measures - Mobilize with PT/OT  Aspiration Pneumonitis with Without PNA Sphenoid Sinusitis Wedge Shaped Opacity on CXR 3/17 Extubated 3/14, suspected aspiration event on 3/15, tachypnea with hypoxemia requiring intubation on 3/16, CXR post intubation shows hampton's hump in RML with questionable edema vs infiltrate vs infarct - Non con chest ct ordered - Tracheal aspirate ordered - continue  Cefepime 3/16 for empiric treatment, will discontinue based on CT imaging - Follow up on MRSA Swab  Leukocytosis/Fever Possible aspiration pnumonia vs edema vs infiltrate - Continue cefepime - fever curve continues to improve - WBC downtrend, continue to trend   Bilateral Gastrocnemius Deep Vein Thrombosis -Switched from eliquis to lovenox due to possible trach requirement -patient will need for 3-6 months of therapy, will consider transition back to eliquis based on trach discussions  Hypertension Not on home agents, suspect undiagnosed. Followed at the [place]. Esmolol weaned this morning. - Continue Coreg, norvasc, hydralazine, clonididne - follow up on aldosterone/renin activity ration - will consider further diuresis based on ct images  Tachycardia, elevated d-dimer Consistent with suspected PE. ECHO demonstrated EF around 60% with no RV strain. - Continue anticoagulation plan as discussed - If new hemodynamic instability, consider tPA  AKI , Rhabdomyolysis Hypernatremia Hyperphosphatemia Cause unclear.  Question meds vs aggressively fighting/ resisting.  No overt seizure activity noted. - Start free h2o q6h - Continue trending BMP/UOP - Replace electolytes as needed - Goal map of 65 for renal perfusion, avoid nephrotoxic agents  Hyperglycemia - Critically ill, glucose goal of 140-160 - continue monitoring with increasing tube feeding, no SSI need currently  Abdominal Distention No documented stool output, gastric bubble improving on CXR, KUB yesterday showed gas with  no evidence of bowel obstruction  - Progress to goal tube feeds through ND tube - Continue reglan - Wean fentanyl as able - Continue bowel regimen - Mobilize   Anemia due to critical illness, dilution - Continue trending CBC - Transfuse with goal of HGB >9  Best practice (evaluated daily)  Diet: NPO, Tube feeds Pain/Anxiety/Delirium protocol (if indicated): n/a VAP protocol (if indicated):  Yes DVT prophylaxis: enoxaprin GI prophylaxis: protonix Glucose control: n/a Mobility: bedrest Disposition: ICU Family: Father, Gabriel Rung, updated via phone 3/17 on plan of care.   Goals of Care:  Code Status: Full Code   Labs   CBC: Recent Labs  Lab 11/06/20 0416 11/07/20 0140 11/08/20 0309 11/08/20 0330 11/09/20 0237 11/09/20 1653 11/10/20 0354  WBC 13.9* 15.1*  --  14.2* 15.8*  --  11.5*  HGB 8.6* 8.7* 8.8* 8.6* 8.9* 9.9* 8.9*  HCT 27.4* 27.1* 26.0* 27.5* 27.9* 29.0* 27.9*  MCV 97.5 96.8  --  97.5 97.9  --  97.2  PLT 335 382  --  407* 492*  --  504*    Basic Metabolic Panel: Recent Labs  Lab 11/04/20 0208 11/05/20 0311 11/06/20 1024 11/07/20 0140 11/07/20 0818 11/08/20 0309 11/08/20 0330 11/09/20 0237 11/09/20 1653 11/10/20 0354  NA 141   < > 144 146*  --  154* 150* 144 149* 146*  K 3.9   < > 5.3* 4.7   < > 4.0 4.0 4.5 3.8 4.5  CL 107   < > 109 109  --   --  112* 110  --  113*  CO2 22   < > 24 23  --   --  28 23  --  23  GLUCOSE 154*   < > 138* 104*  --   --  128* 100*  --  129*  BUN 71*   < > 58* 61*  --   --  59* 54*  --  52*  CREATININE 7.16*   < > 3.66* 3.34*  --   --  2.79* 2.53*  --  2.28*  CALCIUM 8.0*   < > 9.0 8.8*  --   --  8.9 8.8*  --  8.5*  MG 2.0  --   --   --   --   --  1.9  --   --   --   PHOS 5.9*  --   --   --   --   --   --   --   --   --    < > = values in this interval not displayed.   GFR: Estimated Creatinine Clearance: 51.3 mL/min (A) (by C-G formula based on SCr of 2.28 mg/dL (H)). Recent Labs  Lab 11/05/20 0311 11/06/20 0416 11/06/20 1036 11/06/20 1257 11/07/20 0140 11/08/20 0330 11/09/20 0237 11/10/20 0354  PROCALCITON 0.72 0.42  --   --   --   --  0.71 0.49  WBC 13.2* 13.9*  --   --  15.1* 14.2* 15.8* 11.5*  LATICACIDVEN  --   --  1.5 1.1  --   --   --   --     Liver Function Tests: No results for input(s): AST, ALT, ALKPHOS, BILITOT, PROT, ALBUMIN in the last 168 hours. No results for input(s): LIPASE, AMYLASE in the  last 168 hours. No results for input(s): AMMONIA in the last 168 hours.  ABG    Component Value Date/Time   PHART 7.353 11/09/2020 1653   PCO2ART 45.3 11/09/2020 1653  PO2ART 476 (H) 11/09/2020 1653   HCO3 24.9 11/09/2020 1653   TCO2 26 11/09/2020 1653   ACIDBASEDEF 1.0 11/09/2020 1653   O2SAT 100.0 11/09/2020 1653     Coagulation Profile: No results for input(s): INR, PROTIME in the last 168 hours.  Cardiac Enzymes: Recent Labs  Lab 11/05/20 0034 11/07/20 0140 11/08/20 0330 11/09/20 0915 11/10/20 0354  CKTOTAL 1,481* 540* 359 338 211    HbA1C: No results found for: HGBA1C  CBG: Recent Labs  Lab 11/09/20 1548 11/09/20 1950 11/09/20 2336 11/10/20 0342 11/10/20 0737  GLUCAP 96 100* 109* 107* 129*    Critical Care Time: 36 minutes   Gershon Musselimothy Scott Gaius Ishaq, Jr., MSN, APRN, AGACNP-BC [facility]  11/10/2020 , 8:41 AM   Please see [email or web address] for pager details  From 7a-7p if no response, please call [phone] After hours, please call Elink at [phone]

## 2020-11-10 NOTE — Progress Notes (Signed)
Nutrition Follow-up  DOCUMENTATION CODES:   Obesity unspecified  INTERVENTION:   Continue tube feeding via Cortrak: Osmolite 1.5 at 55 ml/h (1320 ml per day) Prosource TF 45 ml QID  Provides 2140 kcal, 127 gm protein, 1006 mll free water daily  NUTRITION DIAGNOSIS:   Inadequate oral intake related to inability to eat as evidenced by NPO status.  Ongoing  GOAL:   Patient will meet greater than or equal to 90% of their needs  Progressing   MONITOR:   TF tolerance,Labs,Diet advancement  REASON FOR ASSESSMENT:   Consult Enteral/tube feeding initiation and management  ASSESSMENT:   40 year old male who presented to the ED on 3/2 with chest pain x 2 weeks and developed acute hyperactive delirium. No pertinent PMH. Pt intubated for airway protection.  Patient required reintubation on 3/16. Plans for trach placement 3/18.  Patient is currently receiving Osmolite 1.5 via Cortrak at 55 ml/h (1320 ml/day) with Prosource TF 45 ml QID to provide 2140 kcals, 127 gm protein, 1006 ml free water daily.  Free water flushes 100 ml q 6 hours.  Patient is currently intubated on ventilator support MV: 9.3 L/min Temp (24hrs), Avg:99.2 F (37.3 C), Min:97.7 F (36.5 C), Max:100.6 F (38.1 C)   Labs reviewed. Na 146 CBG: 129-118  Medications reviewed and include Colace, Reglan, Miralax, Senokot, vitamin B-12.  Lowest weight since admission 102 kg Current weight 105.6 kg I/O +9.5 L since admission   Diet Order:   Diet Order            Diet NPO time specified  Diet effective midnight           Diet NPO time specified  Diet effective now                 EDUCATION NEEDS:   No education needs have been identified at this time  Skin:  Skin Assessment: Reviewed RN Assessment  Last BM:  3/16  Height:   Ht Readings from Last 1 Encounters:  11/07/20 5\' 10"  (1.778 m)    Weight:   Wt Readings from Last 1 Encounters:  11/10/20 105.6 kg    BMI:  Body mass index  is 33.4 kg/m.  Estimated Nutritional Needs:   Kcal:  2205  Protein:  120-140 gm  Fluid:  >/= 2.2 L   2206, RD, LDN, CNSC Please refer to Amion for contact information.

## 2020-11-10 NOTE — Progress Notes (Signed)
Occupational Therapy Treatment Patient Details Name: Andrew [name] MRN: [id number] DOB: 20-Feb-1981 Today's Date: 11/10/2020    History of present illness 39y/o male admitted 3/2 with two weeks of chest pain.  Pt was emergently intubated 3/2- 3/4. EEG suggested moderate, diffuse encephalopathy without seizure activity. Found to be in rhabdo with AKI. RUE pain--MRI shoulder:Edema in musculature about the shoulder. reintubated 3/6 due to hypoactive delirium for airway protection; 3/7 lumbar puncture. Extubation 3/14. PMhx: PTSD, anxiety. Re-intubated 3/17.   OT comments  This 40 yo male admitted with above presents to acute OT today with only AAROM x 4 extremities due pt re-intubated last night and RN feels that ROM is the best for him today. Pt was able to A with all exercises (doing ~25% of work). He will continue to benefit from acute OT with follow up on CIR still recommended.  Follow Up Recommendations  CIR;Supervision/Assistance - 24 hour    Equipment Recommendations  Other (comment) (TBD next venue)       Precautions / Restrictions Precautions Precautions: Fall Precaution Comments: RUE not currently giving him any pain as it was last week Restrictions Weight Bearing Restrictions: No                                     Exercises Other Exercises Other Exercises: Supine in bed patient completed 10 reps of Bil LE AAROM hip flexion, hip add/abduction, and ankle pumps (ankles remain with good AAROM for standing/walking when able); 10 reps of Bil UE AAROM shoulder flexion (~90 degrees at shoulder) and elbow flexion/extension.        Frequency  Min 2X/week        Progress Toward Goals  OT Goals(current goals can now be found in the care plan section)  Progress towards OT goals: Not progressing toward goals - comment  Acute Rehab OT Goals Patient Stated Goal: to have some water OT Goal Formulation: With patient Time For Goal Achievement: 11/22/20 Potential  to Achieve Goals: Good  Plan Discharge plan remains appropriate       AM-PAC OT "6 Clicks" Daily Activity     Outcome Measure   Help from another person eating meals?: Total Help from another person taking care of personal grooming?: Total Help from another person toileting, which includes using toliet, bedpan, or urinal?: Total Help from another person bathing (including washing, rinsing, drying)?: Total Help from another person to put on and taking off regular upper body clothing?: Total Help from another person to put on and taking off regular lower body clothing?: Total 6 Click Score: 6    End of Session Equipment Utilized During Treatment:  (endotracheal tube)  OT Visit Diagnosis: Unsteadiness on feet (R26.81);Muscle weakness (generalized) (M62.81);Other symptoms and signs involving cognitive function   Activity Tolerance Patient tolerated treatment well   Patient Left in bed;with call bell/phone within reach;with bed alarm set   Nurse Communication  (pt wanting water and not happy that he can't have any--he is open to a mouth swab at end of session (was not earlier))        Time: 8841-6606 OT Time Calculation (min): 24 min  Charges: OT General Charges $OT Visit: 1 Visit OT Treatments $Therapeutic Exercise: 23-37 mins  Ignacia Palma, OTR/L Acute [employer] Pager [phone] Office [phone]      Evette Georges 11/10/2020, 11:05 AM

## 2020-11-10 NOTE — Progress Notes (Signed)
   11/10/20 0854  Vent Select  Invasive or Noninvasive Invasive  Adult Vent Y  Airway 8 mm  Placement Date/Time: 11/09/20 1513   Placed By: (c) ICU physician  Airway Device: Endotracheal Tube  Laryngoscope Blade: (c) MAC;4  ETT Types: Oral  Size (mm): 8 mm  Cuffed: Cuffed  Insertion attempts: 1  Airway Equipment: Stylet;Video Laryngoscope  P...  Secured at (cm) 26 cm  Measured From Lips  Secured Location Right  Secured By Actuary Repositioned Yes  Prone position No  Head position Right  Cuff Pressure (cm H2O) 22 cm H2O  Site Condition Drainage (Comment) (oral secretions noted, cleared.)  Adult Ventilator Settings  Vent Type Servo i  Humidity HME  Vent Mode PRVC  Vt Set 580 mL  Set Rate 15 bmp  FiO2 (%) 30 %  I Time 0.9 Sec(s)  PEEP 5 cmH20  Adult Ventilator Measurements  Peak Airway Pressure 19 L/min  Mean Airway Pressure 8 cmH20  Resp Rate Spontaneous 3 br/min  Resp Rate Total 18 br/min  Exhaled Vt 795 mL  Measured Ve 9.5 mL  I:E Ratio Measured 1:6.1  Total PEEP 5 cmH20  SpO2 100 %  Adult Ventilator Alarms  Alarms On Y  Ve High Alarm 18 L/min  Ve Low Alarm 6 L/min  Resp Rate High Alarm 38 br/min  Resp Rate Low Alarm 13  PEEP Low Alarm 4 cmH2O  Press High Alarm 45 cmH2O  T Apnea 20 sec(s)  VAP Prevention  HOB> 30 Degrees Y  Equipment wiped down Yes  Daily Weaning Assessment  Daily Assessment of Readiness to Wean Wean protocol criteria met (SBT performed)  SBT Method (S)   (PSV 10/5, low RR, patient complained of pain, failed wean.)  Reason SBT Terminated Excessive agitation, marked accessory muscle use, abdominal paradox, or diaphoresis noted  Breath Sounds  Bilateral Breath Sounds Clear;Diminished  Vent Respiratory Assessment  Patient Effort Fair  Level of Consciousness Alert  Respiratory Pattern Regular;Unlabored (on rest mode.)  Respiratory Effort Short of breath (with exertion.)  Oral Suctioning/Secretions  Suction Type  Oral  Suction Device Yankauer  Secretion Amount Small  Secretion Color White;Yellow  Secretion Consistency Thin  Suction Tolerance Tolerated well  Suctioning Adverse Effects None  Airway Suctioning/Secretions  Suction Type ETT  Suction Device  Catheter  Secretion Amount Small  Secretion Color White;Pink tinged  Secretion Consistency Thin  Suction Tolerance Tolerated well  Suctioning Adverse Effects None

## 2020-11-10 NOTE — Progress Notes (Signed)
Father, Gabriel Rung, called and discussed timing of trach, procedure details, risks & benefits of procedure.  Consent obtained with two person verified, on chart.   Plan for trach 3/18 11am    [name], MSN, APRN, NP-C, AGACNP-BC [facility] 11/10/2020, 1:00 PM   Please see [email or web address] for pager details.   From 7A-7P if no response, please call [phone] After hours, please call ELink [phone]

## 2020-11-10 NOTE — Progress Notes (Signed)
Pt was transported from 2M15 to CT, room 2, and back on 100% FIO2, 2 full tanks present on the back of vent, 100% Sp02 maintained throughout transport, on PRVC, previous settings.  No respiratory distress noted, ETT secure.

## 2020-11-11 ENCOUNTER — Inpatient Hospital Stay (HOSPITAL_COMMUNITY): Payer: Medicaid Other

## 2020-11-11 LAB — GLUCOSE, CAPILLARY
Glucose-Capillary: 106 mg/dL — ABNORMAL HIGH (ref 70–99)
Glucose-Capillary: 110 mg/dL — ABNORMAL HIGH (ref 70–99)
Glucose-Capillary: 114 mg/dL — ABNORMAL HIGH (ref 70–99)
Glucose-Capillary: 114 mg/dL — ABNORMAL HIGH (ref 70–99)
Glucose-Capillary: 79 mg/dL (ref 70–99)

## 2020-11-11 LAB — BASIC METABOLIC PANEL
Anion gap: 9 (ref 5–15)
BUN: 42 mg/dL — ABNORMAL HIGH (ref 6–20)
CO2: 24 mmol/L (ref 22–32)
Calcium: 9 mg/dL (ref 8.9–10.3)
Chloride: 117 mmol/L — ABNORMAL HIGH (ref 98–111)
Creatinine, Ser: 1.83 mg/dL — ABNORMAL HIGH (ref 0.61–1.24)
GFR, Estimated: 47 mL/min — ABNORMAL LOW (ref >60)
Glucose, Bld: 116 mg/dL — ABNORMAL HIGH (ref 70–99)
Potassium: 3.5 mmol/L (ref 3.5–5.1)
Sodium: 150 mmol/L — ABNORMAL HIGH (ref 135–145)

## 2020-11-11 LAB — CBC
HCT: 27.6 % — ABNORMAL LOW (ref 39.0–52.0)
Hemoglobin: 8.5 g/dL — ABNORMAL LOW (ref 13.0–17.0)
MCH: 30.8 pg (ref 26.0–34.0)
MCHC: 30.8 g/dL (ref 30.0–36.0)
MCV: 100 fL (ref 80.0–100.0)
Platelets: 503 10*3/uL — ABNORMAL HIGH (ref 150–400)
RBC: 2.76 MIL/uL — ABNORMAL LOW (ref 4.22–5.81)
RDW: 13.6 % (ref 11.5–15.5)
WBC: 10.6 10*3/uL — ABNORMAL HIGH (ref 4.0–10.5)
nRBC: 0 % (ref 0.0–0.2)

## 2020-11-11 LAB — HEPARIN LEVEL (UNFRACTIONATED): Heparin Unfractionated: 0.5 IU/mL (ref 0.30–0.70)

## 2020-11-11 LAB — HEPARIN ANTI-XA: Heparin LMW: 0.56 IU/mL

## 2020-11-11 LAB — APTT: aPTT: 33 seconds (ref 24–36)

## 2020-11-11 LAB — PROCALCITONIN: Procalcitonin: 0.27 ng/mL

## 2020-11-11 MED ORDER — STERILE WATER FOR INJECTION IJ SOLN
INTRAMUSCULAR | Status: AC
  Administered 2020-11-11: 10 mL
  Filled 2020-11-11: qty 10

## 2020-11-11 MED ORDER — SODIUM CHLORIDE 0.9 % IV SOLN
2.0000 g | Freq: Three times a day (TID) | INTRAVENOUS | Status: AC
  Administered 2020-11-11 – 2020-11-15 (×14): 2 g via INTRAVENOUS
  Filled 2020-11-11 (×14): qty 2

## 2020-11-11 MED ORDER — ENOXAPARIN SODIUM 100 MG/ML ~~LOC~~ SOLN
100.0000 mg | Freq: Two times a day (BID) | SUBCUTANEOUS | Status: DC
  Administered 2020-11-11 – 2020-11-14 (×5): 100 mg via SUBCUTANEOUS
  Filled 2020-11-11 (×7): qty 1

## 2020-11-11 MED ORDER — FREE WATER
150.0000 mL | Status: DC
  Administered 2020-11-11 – 2020-11-12 (×4): 150 mL

## 2020-11-11 MED ORDER — METOCLOPRAMIDE HCL 5 MG PO TABS
5.0000 mg | ORAL_TABLET | Freq: Three times a day (TID) | ORAL | Status: DC
  Administered 2020-11-11 – 2020-11-15 (×12): 5 mg
  Filled 2020-11-11 (×14): qty 1

## 2020-11-11 NOTE — Progress Notes (Signed)
RT present, oxygenated with 100% FIO2 during full support ventilation while trach was placed, airway is secure, extra trach and obturator at the bedside.  Pt weaned to 30%, Sp02 100%, suctioned, thick, clear, white amounts of secretions.

## 2020-11-11 NOTE — Progress Notes (Signed)
NAME:  Andrew [name], MRN:  [id number], DOB:  [birth year], LOS: 15 ADMISSION DATE:  10/26/2020,   Brief History:  40 y/o male admitted 3/2 with chest pain & acute hyperactive delirium requiring mechanical ventilation.  Subsequently found to be in rhabdo with AKI requiring HD.  Concern Wellbutrin, Seroquel contributing.  UDS negative x2, CSF negative, MRI negative. Extubated on 3/14 and re-intubated for the third time 3/16.  Past Medical History:  PTSD Depression  Significant Hospital Events:  3/04 extubated, AKI worse, found to be in rhabdo 3/05 Continues to have diffuse muscle pain, renal consult 3/06 increased body pain particularly right shoulder, concern for septic arthritis, MRI neg 3/13 Abd distention, cuff leak, bloody drainage around ETT  3/14 Weaning on PSV, remains on high dose sedation, extubated, delirium overnight on dex 3/15 Episode of desaturation after eating, suspected silent aspiration. Vasovagal event with ice water 3/16 Re-intubated, remains on esmolol gtt / resistant HTN  Consults:  Renal Psych Neuro  Procedures:  ETT 3/3  >> 3/4 ETT 3/6 >> 3/14 HD catheter 3/9 >> 3/15 ETT 3/16 >>  Significant Diagnostic Tests:   Head CT 3/3 >> sphenoid sinusitis  Echo 3/3 >> nml LVEF, no WMA  MRI right shoulder 3/6 >> no evidence of septic joint, no effusion, severe edema and muscles around shoulder joint consistent with rhabdo  Head CT 3/6 >> neg  CSF cell count 3/7 >> WBC 2, RBC 2 (hypocellular)  MRI brain 3/7 >> normal, paranasal sinus mucosal thickening, small mastoid effusions  BLLE Ultrasound 3/14  >> BL DVT gastrocnemius  Limited ECHO 3/15 >> EF 60, no evidence of RV strain 3/17 Chest CT >>1. The appearance of the lungs is most compatible with severe multilobar bilateral bronchopneumonia. 2. Trace right pleural effusion. 3. Support apparatus, as above.   Micro Data:  COVID 3/3 >> negative Influenza 3/3 >> negative Resp 3/3 >> nml flora Blood 3/3  >> negative Blood 3/6 >> negative CSF 3/7 >> negative  Fecal lactoferrin 3/11 >> negative UA 3/14 >> +Ketones, Neg MRSA swab 3/17 >> Trach asp 3/17 >>  Antimicrobials:  Ceftx 3/3 >> 3/6 Azithro 3/3 x 1 Cefazolin 3/6 >> 3/7 Ceftriaxone 3/7 >> 3/9 Vanc 3/6  X 1 dose Vanco 3/7 >> 3/8 Ceftriaxone 3/13 >> 3/15 Cefepime 3/16 >>  Interval events:   3/18: off esmolol gtt this am. Sodium up to 150. Needs free water, renal indices cont to improve as well as ck  Objective   Blood pressure 113/70, pulse 73, temperature 99.1 F (37.3 C), temperature source Oral, resp. rate 15, height 5\' 10"  (1.778 m), weight 102.8 kg, SpO2 99 %.    Vent Mode: PRVC FiO2 (%):  [30 %] 30 % Set Rate:  [15 bmp] 15 bmp Vt Set:  [580 mL] 580 mL PEEP:  [5 cmH20] 5 cmH20 Plateau Pressure:  [19 cmH20-23 cmH20] 22 cmH20   Intake/Output Summary (Last 24 hours) at 11/11/2020 0755 Last data filed at 11/11/2020 0600 Gross per 24 hour  Intake 2140.22 ml  Output 4350 ml  Net -2209.78 ml   Filed Weights   11/09/20 0314 11/10/20 0500 11/11/20 0500  Weight: 111.3 kg 105.6 kg 102.8 kg    Examination: General: in bed, appears comfortable, unlabored, mildly interactive with staff HEENT: MM pink & moist, trachea midline, right IJ HD cath, clean site, Left ND tube, ETT tube Neuro: Rass -1, GCS 10T, eyes open to voice, PERRL, MAE CV: s1s2, NSR, no m/r/g appreciated, cap refill x<3 PULM: rhonchi  but diminished, symmetrical chest expansion, moderate white/yellow sputum GI: Mild distention, decreased bowel sounds x4, grimace on abdominal palpation,  flexiseal in place Extremities: warm, skin dry, 1+ pretibial edema  Skin: no rashes or lesions   Resolved problems:  Acute AG metabolic acidosis Bloody/coffee-ground NG aspirate Acute Hypoxic Respiratory Failure requiring Mechanical Ventilation     Assessment & Plan:   Acute Hypoxemic Respiratory Failure Requiring Mechanical Ventilation -ct chest 3/17 c/w multilobar  pna, cxr stable from previous (relatively) - Continue PRVC with TV of 56ml/kg today - Wean O2 with goal sat of >90% - VAP protocol - PAD protocol, minimize sedation as able, goal RASS 0 to -1 - Mobilize with PT/OT as able - Eliquis switched to Lovenox with consideration of possible tracheostomy requirement    Acute Metabolic Encephalopathy with Hypoactive Delirium  Evolving neuropathy, suspected due to uremia Underlying PSY / Followed at the [facility]  PTA on multiple psychoactive medications including Seroquel and Wellbutrin. UDS and head CT negative on admit, denies history of drug abuse but sister reports suspected use, 3/5 staff notes girlfriend visiting before his deterioration in mental status. She was noted to be unsteady, suspicious for surreptitious drug use, UDS repeated 3/6 only shows benzos and opiates which are prescribed medicines. MRI brain no acute, CSF no growth.  - cont low dose seroquel and wellbutrin for now - Wean precedex and fentanyl with goal of RASS 0 to -1 - Cont B12 supplementation - Delirium prevention measures - Mobilize with PT/OT  Aspiration Pneumonitis with pneumonia Sphenoid Sinusitis Wedge Shaped Opacity on CXR 3/17 - Non con chest ct ordered - Tracheal aspirate ordered and ngtd - continue Cefepime 3/16 for empiric treatment - MRSA swab negative  Leukocytosis/Fever Possible aspiration pnumonia vs edema vs infiltrate - Continue cefepime x 7 days - fever curve continues to improve - WBC downtrend, continue to trend   Bilateral Gastrocnemius Deep Vein Thrombosis -Switched from eliquis to lovenox due to need for trach -patient will need for 3-6 months of therapy, will consider transition back to eliquis based on trach discussions  Hypertension Not on home agents, suspect undiagnosed. Followed at the [place]. Esmolol off this am - Continue Coreg, norvasc, hydralazine, clonididne - will consider further diuresis based on ct images  Tachycardia, elevated  d-dimer Consistent with suspected PE. ECHO demonstrated EF around 60% with no RV strain. - Continue anticoagulation plan as discussed - If new hemodynamic instability, consider tPA  AKI , Rhabdomyolysis, improving Hypernatremia Hyperphosphatemia Cause unclear.  Question meds vs aggressively fighting/ resisting.  No overt seizure activity noted. - increase fwf to q4h - Continue trending BMP/UOP - Replace electolytes as needed - Goal map of 65 for renal perfusion, avoid nephrotoxic agents  Hyperglycemia - Critically ill, glucose goal of 140-160 - continue monitoring with increasing tube feeding, no SSI need currently  Abdominal Distention No documented stool output, gastric bubble improving on CXR, KUB yesterday showed gas with no evidence of bowel obstruction  - Progress to goal tube feeds through ND tube - Continue reglan - Wean fentanyl as able - Continue bowel regimen - Mobilize as able -kub  Anemia due to critical illness, dilution - Continue trending CBC - Transfuse with goal of HGB >7  Best practice (evaluated daily)  Diet: NPO, Tube feeds Pain/Anxiety/Delirium protocol (if indicated): n/a VAP protocol (if indicated): Yes DVT prophylaxis: enoxaparin GI prophylaxis: protonix Glucose control: n/a Mobility: bedrest Disposition: ICU Family: Father, Gabriel Rung, updated via phone 3/17 on plan of care.   Goals of Care:  Code Status: Full Code   Labs   CBC: Recent Labs  Lab 11/07/20 0140 11/08/20 0309 11/08/20 0330 11/09/20 0237 11/09/20 1653 11/10/20 0354 11/11/20 0511  WBC 15.1*  --  14.2* 15.8*  --  11.5* 10.6*  HGB 8.7*   < > 8.6* 8.9* 9.9* 8.9* 8.5*  HCT 27.1*   < > 27.5* 27.9* 29.0* 27.9* 27.6*  MCV 96.8  --  97.5 97.9  --  97.2 100.0  PLT 382  --  407* 492*  --  504* 503*   < > = values in this interval not displayed.    Basic Metabolic Panel: Recent Labs  Lab 11/07/20 0140 11/07/20 0818 11/08/20 0330 11/09/20 0237 11/09/20 1653 11/10/20 0354  11/11/20 0511  NA 146*   < > 150* 144 149* 146* 150*  K 4.7   < > 4.0 4.5 3.8 4.5 3.5  CL 109  --  112* 110  --  113* 117*  CO2 23  --  28 23  --  23 24  GLUCOSE 104*  --  128* 100*  --  129* 116*  BUN 61*  --  59* 54*  --  52* 42*  CREATININE 3.34*  --  2.79* 2.53*  --  2.28* 1.83*  CALCIUM 8.8*  --  8.9 8.8*  --  8.5* 9.0  MG  --   --  1.9  --   --   --   --    < > = values in this interval not displayed.   GFR: Estimated Creatinine Clearance: 63.1 mL/min (A) (by C-G formula based on SCr of 1.83 mg/dL (H)). Recent Labs  Lab 11/05/20 0311 11/06/20 0416 11/06/20 1036 11/06/20 1257 11/07/20 0140 11/08/20 0330 11/09/20 0237 11/10/20 0354 11/11/20 0511  PROCALCITON 0.72 0.42  --   --   --   --  0.71 0.49  --   WBC 13.2* 13.9*  --   --    < > 14.2* 15.8* 11.5* 10.6*  LATICACIDVEN  --   --  1.5 1.1  --   --   --   --   --    < > = values in this interval not displayed.    Liver Function Tests: No results for input(s): AST, ALT, ALKPHOS, BILITOT, PROT, ALBUMIN in the last 168 hours. No results for input(s): LIPASE, AMYLASE in the last 168 hours. No results for input(s): AMMONIA in the last 168 hours.  ABG    Component Value Date/Time   PHART 7.353 11/09/2020 1653   PCO2ART 45.3 11/09/2020 1653   PO2ART 476 (H) 11/09/2020 1653   HCO3 24.9 11/09/2020 1653   TCO2 26 11/09/2020 1653   ACIDBASEDEF 1.0 11/09/2020 1653   O2SAT 100.0 11/09/2020 1653     Coagulation Profile: No results for input(s): INR, PROTIME in the last 168 hours.  Cardiac Enzymes: Recent Labs  Lab 11/05/20 0034 11/07/20 0140 11/08/20 0330 11/09/20 0915 11/10/20 0354  CKTOTAL 1,481* 540* 359 338 211    HbA1C: No results found for: HGBA1C  CBG: Recent Labs  Lab 11/10/20 1124 11/10/20 1535 11/10/20 1940 11/10/20 2334 11/11/20 0320  GLUCAP 118* 116* 125* 121* 106*    Critical care time: The patient is critically ill with multiple organ systems failure and requires high complexity  decision making for assessment and support, frequent evaluation and titration of therapies, application of advanced monitoring technologies and extensive interpretation of multiple databases.  Critical care time 37mins. This represents my time independent of the NPs  time taking care of the pt. This is excluding procedures.    Briant Sites DO [facility] 11/11/2020, 7:55 AM See Amion for pager If no response to pager, please call [phone] until 1900 After 1900 please call [facility] [phone]

## 2020-11-11 NOTE — Procedures (Signed)
Percutaneous Tracheostomy Procedure Note   Andrew [name]  [id number]  March 08, 1981  Date:11/11/20  Time:3:47 PM   Provider Performing:Lorae Roig  Procedure: Percutaneous Tracheostomy with Bronchoscopic Guidance (08657)  Indication(s) Prolonged mechanical ventilation.   Consent Risks of the procedure as well as the alternatives and risks of each were explained to the patient and/or caregiver.  Consent for the procedure was obtained.  Anesthesia Etomidate, Versed, Fentanyl, Vecuronium   Time Out Verified patient identification, verified procedure, site/side was marked, verified correct patient position, special equipment/implants available, medications/allergies/relevant history reviewed, required imaging and test results available.   Sterile Technique Maximal sterile technique including sterile barrier drape, hand hygiene, sterile gown, sterile gloves, mask, hair covering.    Procedure Description Appropriate anatomy identified by palpation.  Patient's neck prepped and draped in sterile fashion.  1% lidocaine with epinephrine was used to anesthetize skin overlying neck.  1.5cm incision made and blunt dissection performed until tracheal rings could be easily palpated.   Then a size 6 Shiley tracheostomy was placed under bronchoscopic visualization using usual Seldinger technique and serial dilation.   Bronchoscope confirmed placement above the carina.  Tracheostomy was sutured in place with adhesive pad to protect skin under pressure.    Patient connected to ventilator.   Complications/Tolerance None; patient tolerated the procedure well. Chest X-ray is ordered to confirm no post-procedural complication.   EBL Minimal   Specimen(s) None   Andrew Catalan, MD [facility] Xxx ICU Physician [facility] Critical Care  Pager: [phone] Or Epic Secure Chat After hours: [phone].  11/11/2020, 3:48 PM

## 2020-11-11 NOTE — Procedures (Signed)
Diagnostic Bronchoscopy  Andrew [name]  [id number]  [birth year]  Date:11/11/20  Time:3:47 PM   Provider Performing:Pete E Tanja Port   Procedure: Diagnostic Bronchoscopy (88891)  Indication(s) Assist with direct visualization of tracheostomy placement  Consent Risks of the procedure as well as the alternatives and risks of each were explained to the patient and/or caregiver.  Consent for the procedure was obtained.   Anesthesia See separate tracheostomy note   Time Out Verified patient identification, verified procedure, site/side was marked, verified correct patient position, special equipment/implants available, medications/allergies/relevant history reviewed, required imaging and test results available.   Sterile Technique Usual hand hygiene, masks, gowns, and gloves were used   Procedure Description Bronchoscope advanced through endotracheal tube and into airway.  After suctioning out tracheal secretions, bronchoscope used to provide direct visualization of tracheostomy placement.   Complications/Tolerance None; patient tolerated the procedure well.   EBL None  Specimen(s) None    Simonne Martinet ACNP-BC [facility] Pulmonary/Critical Care Pager # [phone] OR # [phone] if no answer

## 2020-11-11 NOTE — Progress Notes (Signed)
Physical Therapy Treatment Patient Details Name: Andrew [name] MRN: [id number] DOB: May 23, 1981 Today's Date: 11/11/2020    History of Present Illness 40y/o male admitted 3/2 with two weeks of chest pain.  Pt was emergently intubated 3/2- 3/4. EEG suggested moderate, diffuse encephalopathy without seizure activity. Found to be in rhabdo with AKI. RUE pain--MRI shoulder:Edema in musculature about the shoulder. reintubated 3/6 due to hypoactive delirium for airway protection; 3/7 lumbar puncture. Extubation 3/14.  Re-intubated 3/17. PMhx: PTSD, anxiety.   PT Comments    Pt admitted with above diagnosis. Pt re-intubated 3/17 and for ROM only today per nurse and MD. Pt to be trached today.  Hopeful that pt can continue mobility on Monday.  Pt moves all 4 extremities well. Goals revised today as  0/5 goals met.  Goals revised.  Pt currently with functional limitations due to balance and endurance deficits. Pt will benefit from skilled PT to increase their independence and safety with mobility to allow discharge to the venue listed below.     Follow Up Recommendations  CIR     Equipment Recommendations  Other (comment) (TBD)    Recommendations for Other Services OT consult     Precautions / Restrictions Precautions Precautions: Fall Restrictions Weight Bearing Restrictions: No    Mobility  Bed Mobility               General bed mobility comments: bed level eval only    Transfers                    Ambulation/Gait                 Stairs             Wheelchair Mobility    Modified Rankin (Stroke Patients Only)       Balance                                            Cognition Arousal/Alertness: Awake/alert Behavior During Therapy: Flat affect Overall Cognitive Status: Impaired/Different from baseline Area of Impairment: Orientation;Following commands;Safety/judgement;Awareness;Problem solving                  Orientation Level: Disoriented to;Situation;Time Current Attention Level: Sustained   Following Commands: Follows one step commands with increased time Safety/Judgement: Decreased awareness of safety;Decreased awareness of deficits Awareness: Intellectual Problem Solving: Slow processing;Requires verbal cues;Requires tactile cues General Comments: Pt following 1 step commands      Exercises General Exercises - Upper Extremity Shoulder Flexion: AROM;Left;AAROM;Right;10 reps;Supine Shoulder ADduction: AROM;Left;AAROM;Right;10 reps;Supine Shoulder Horizontal ABduction: AROM;Left;AAROM;Right;10 reps;Supine Elbow Flexion: AROM;Both;10 reps;Supine Elbow Extension: AROM;Both;10 reps;Supine Digit Composite Flexion: AROM;5 reps;Both Composite Extension: AROM;Both;5 reps General Exercises - Lower Extremity Ankle Circles/Pumps: PROM;Both (30 sec stretch x 2 bil; to neutral bil) Quad Sets: AROM;Both;10 reps;Supine Gluteal Sets: AROM;Both;10 reps;Supine Heel Slides: AAROM;Both;10 reps;Supine (hip flexion 45 Rt 30 Lt) Hip ABduction/ADduction: AAROM;Both;10 reps;Supine Straight Leg Raises: AAROM;Both;10 reps;Supine    General Comments General comments (skin integrity, edema, etc.): 77 bpm, 100% on 30% FiO2, PEEP 5, 127/83, PRVC      Pertinent Vitals/Pain Pain Assessment: No/denies pain    Home Living                      Prior Function            PT Goals (current goals can  now be found in the care plan section) Acute Rehab PT Goals PT Goal Formulation: Patient unable to participate in goal setting Time For Goal Achievement: 12/02/20 Potential to Achieve Goals: Fair Progress towards PT goals: Progressing toward goals    Frequency    Min 3X/week      PT Plan Current plan remains appropriate    Co-evaluation              AM-PAC PT "6 Clicks" Mobility   Outcome Measure  Help needed turning from your back to your side while in a flat bed without using  bedrails?: A Lot Help needed moving from lying on your back to sitting on the side of a flat bed without using bedrails?: A Lot Help needed moving to and from a bed to a chair (including a wheelchair)?: Total Help needed standing up from a chair using your arms (e.g., wheelchair or bedside chair)?: Total Help needed to walk in hospital room?: Total Help needed climbing 3-5 steps with a railing? : Total 6 Click Score: 8    End of Session   Activity Tolerance: Patient limited by pain;Patient limited by fatigue Patient left: with call bell/phone within reach;in bed;with restraints reapplied Nurse Communication: Mobility status PT Visit Diagnosis: Other abnormalities of gait and mobility (R26.89);Muscle weakness (generalized) (M62.81);Pain Pain - Right/Left:  (bil) Pain - part of body: Leg     Time: 7106-2694 PT Time Calculation (min) (ACUTE ONLY): 20 min  Charges:  $Therapeutic Exercise: 8-22 mins                     Andrew [name] M,PT Acute Rehab Services [phone] (pager)   Andrew [name] 11/11/2020, 1:53 PM

## 2020-11-11 NOTE — Progress Notes (Addendum)
Pharmacy Antibiotic Note  Andrew [name] is a 40 y.o. male admitted on 10/26/2020 after complaining of chest pain x2 weeks. Patient found to be in rhabdo and was emergently intubated on 3/2. Patient extubated and reintubated twice throughout admission, most recently intubated 3/16. Patient now with possible VAP. Pharmacy has been consulted for cefepime dosing.   Patient started on cefepime 2 g IV q12h. Rhabdo is resolving and renal function improved (Scr 1.83, CrCl 63.1 mL/min with C-G equation).   Plan: Change cefepime to 2g IV q8h  Monitor signs of infection resolution  Plan for 7 days of therapy from 3/16  Height: 5\' 10"  (177.8 cm) Weight: 102.8 kg (226 lb 10.1 oz) IBW/kg (Calculated) : 73  Temp (24hrs), Avg:98.8 F (37.1 C), Min:97.8 F (36.6 C), Max:99.3 F (37.4 C)  Recent Labs  Lab 11/06/20 1036 11/06/20 1257 11/07/20 0140 11/08/20 0330 11/09/20 0237 11/10/20 0354 11/11/20 0511  WBC  --   --  15.1* 14.2* 15.8* 11.5* 10.6*  CREATININE  --   --  3.34* 2.79* 2.53* 2.28* 1.83*  LATICACIDVEN 1.5 1.1  --   --   --   --   --     Estimated Creatinine Clearance: 63.1 mL/min (A) (by C-G formula based on SCr of 1.83 mg/dL (H)).    No Known Allergies  Antimicrobials this admission: Cefepime 3/16 >> (3/22) Metronidazole 3/13 >> 3/14  CTX 3/3 >> 3/5; 3/7 >> 3/9; 3/13 >>3/15 Cefazolin 3/5>>3/7 Azithro 3/3 >> 3/4 Vanc 3/5>>3/6; 3/7 >>3/8  Dose adjustments this admission: Cefepime 2 g q12h >> Cefepime 2 g q8h  Microbiology results: 3/17 TA: pending 3/17 MRSA PCR: negative  Thank you for allowing pharmacy to be a part of this patient's care.  4/17  PharmD Candidate, Class of 2022 11/11/2020 10:16 AM

## 2020-11-12 LAB — COMPREHENSIVE METABOLIC PANEL
ALT: 18 U/L (ref 0–44)
AST: 27 U/L (ref 15–41)
Albumin: 2.8 g/dL — ABNORMAL LOW (ref 3.5–5.0)
Alkaline Phosphatase: 60 U/L (ref 38–126)
Anion gap: 9 (ref 5–15)
BUN: 40 mg/dL — ABNORMAL HIGH (ref 6–20)
CO2: 26 mmol/L (ref 22–32)
Calcium: 8.6 mg/dL — ABNORMAL LOW (ref 8.9–10.3)
Chloride: 118 mmol/L — ABNORMAL HIGH (ref 98–111)
Creatinine, Ser: 1.75 mg/dL — ABNORMAL HIGH (ref 0.61–1.24)
GFR, Estimated: 49 mL/min — ABNORMAL LOW (ref >60)
Glucose, Bld: 118 mg/dL — ABNORMAL HIGH (ref 70–99)
Potassium: 3.9 mmol/L (ref 3.5–5.1)
Sodium: 153 mmol/L — ABNORMAL HIGH (ref 135–145)
Total Bilirubin: 0.7 mg/dL (ref 0.3–1.2)
Total Protein: 6 g/dL — ABNORMAL LOW (ref 6.5–8.1)

## 2020-11-12 LAB — CBC
HCT: 26.1 % — ABNORMAL LOW (ref 39.0–52.0)
Hemoglobin: 8.3 g/dL — ABNORMAL LOW (ref 13.0–17.0)
MCH: 31.6 pg (ref 26.0–34.0)
MCHC: 31.8 g/dL (ref 30.0–36.0)
MCV: 99.2 fL (ref 80.0–100.0)
Platelets: 483 10*3/uL — ABNORMAL HIGH (ref 150–400)
RBC: 2.63 MIL/uL — ABNORMAL LOW (ref 4.22–5.81)
RDW: 13.5 % (ref 11.5–15.5)
WBC: 11.9 10*3/uL — ABNORMAL HIGH (ref 4.0–10.5)
nRBC: 0 % (ref 0.0–0.2)

## 2020-11-12 LAB — GLUCOSE, CAPILLARY
Glucose-Capillary: 115 mg/dL — ABNORMAL HIGH (ref 70–99)
Glucose-Capillary: 108 mg/dL — ABNORMAL HIGH (ref 70–99)
Glucose-Capillary: 105 mg/dL — ABNORMAL HIGH (ref 70–99)
Glucose-Capillary: 108 mg/dL — ABNORMAL HIGH (ref 70–99)
Glucose-Capillary: 115 mg/dL — ABNORMAL HIGH (ref 70–99)
Glucose-Capillary: 101 mg/dL — ABNORMAL HIGH (ref 70–99)

## 2020-11-12 LAB — CULTURE, RESPIRATORY W GRAM STAIN
Culture: NORMAL
Special Requests: NORMAL

## 2020-11-12 MED ORDER — MIDAZOLAM HCL 2 MG/2ML IJ SOLN
1.0000 mg | INTRAMUSCULAR | Status: DC | PRN
  Administered 2020-11-12 – 2020-11-13 (×3): 2 mg via INTRAVENOUS
  Filled 2020-11-12 (×3): qty 2

## 2020-11-12 MED ORDER — SODIUM CHLORIDE 0.9% FLUSH
10.0000 mL | Freq: Two times a day (BID) | INTRAVENOUS | Status: DC
  Administered 2020-11-13 – 2020-11-23 (×19): 10 mL

## 2020-11-12 MED ORDER — DEXTROSE 5 % IV SOLN: INTRAVENOUS | Status: DC

## 2020-11-12 MED ORDER — SODIUM CHLORIDE 0.9% FLUSH
10.0000 mL | INTRAVENOUS | Status: DC | PRN
Start: 2020-11-12 — End: 2020-11-24

## 2020-11-12 MED ORDER — FREE WATER
150.0000 mL | Status: DC
  Administered 2020-11-12 – 2020-11-13 (×15): 150 mL

## 2020-11-12 MED ORDER — HYDROMORPHONE HCL 1 MG/ML IJ SOLN
0.5000 mg | INTRAMUSCULAR | Status: DC | PRN
  Administered 2020-11-12 – 2020-11-15 (×7): 1 mg via INTRAVENOUS
  Administered 2020-11-15 – 2020-11-17 (×2): 0.5 mg via INTRAVENOUS
  Filled 2020-11-12 (×9): qty 1

## 2020-11-12 NOTE — Progress Notes (Signed)
NAME:  Andrew [name], MRN:  [id number], DOB:  06/15/1981, LOS: 16 ADMISSION DATE:  10/26/2020, CONSULTATION DATE:  10/27/2020 REFERRING MD:  ER CHIEF COMPLAINT: Respiratory Failure  Brief History   Andrew [name] is a 40 y/o male admitted on 3/2 with chest pain & acute hyperactive delirium requiring mechanical ventilation. Subsequently found to be in rhabdo with AKI requiring HD. Concern Wellbutrin, Seroquel contributing. UDS negative x2, CSF negative, MRI negative. Extubated on 3/14 and re-intubated for the third time 3/16. Tracheostomy on 3/18.   Past Medical History  PTSD Depression  Significant Hospital Events   3/04 extubated, AKI worse, found to be in rhabdo 3/05 Continues to have diffuse muscle pain, renal consult 3/06 increased body pain particularly right shoulder, concern for septic arthritis, MRI neg 3/13 Abd distention, cuff leak, bloody drainage around ETT  3/14 Weaning on PSV, remains on high dose sedation, extubated, delirium overnight on dex 3/15 Episode of desaturation after eating, suspected silent aspiration. Vasovagal event with ice water 3/16 Re-intubated, remains on esmolol gtt / resistant HTN 3/18 Tracheostomy  Consults:  Renal Psych Neuro  Procedures:  ETT 3/3  >> 3/4 ETT 3/6 >> 3/14 HD catheter 3/9 >> 3/15 ETT 3/16 >> Trach 3/18 >>  Significant Diagnostic Tests:   Head CT 3/3 >> sphenoid sinusitis  Echo 3/3 >> nml LVEF, no WMA  MRI right shoulder 3/6 >> no evidence of septic joint, no effusion, severe edema and muscles around shoulder joint consistent with rhabdo  Head CT 3/6 >> neg  CSF cell count 3/7 >> WBC 2, RBC 2 (hypocellular)  MRI brain 3/7 >> normal, paranasal sinus mucosal thickening, small mastoid effusions  BLLE Ultrasound 3/14  >> BL DVT gastrocnemius  Limited ECHO 3/15 >> EF 60, no evidence of RV strain  Chest CT 3/17 >> Severe multilobar bilateral bronchopneumonia, trace R pleural effusion   Micro Data:  COVID 3/3 >>  negative Influenza 3/3 >> negative Resp 3/3 >> nml flora Blood 3/3 >> negative Blood 3/6 >> negative CSF 3/7 >> negative  Fecal lactoferrin 3/11 >> negative UA 3/14 >> +Ketones, Neg MRSA swab 3/17 >> Trach asp 3/17 >> pending  Antimicrobials:  Ceftriaxone 3/3 >> 3/6 Azithromycin 3/3 x 1 Cefazolin 3/6 >> 3/7 Ceftriaxone 3/7 >> 3/9 Vanc 3/6  X 1 dose Vanc 3/7 >> 3/8 Ceftriaxone 3/13 >> 3/15 Cefepime 3/16 >>  Interim history/subjective:  Successful trach on 3/18 No acute events overnight Unsuccessful SBT this morning, back on full support  Objective   Blood pressure 125/74, pulse 98, temperature 100.2 F (37.9 C), temperature source Axillary, resp. rate 15, height 5\' 10"  (1.778 m), weight 102.3 kg, SpO2 100 %.    Vent Mode: PRVC FiO2 (%):  [30 %] 30 % Set Rate:  [15 bmp-16 bmp] 16 bmp Vt Set:  [580 mL] 580 mL PEEP:  [5 cmH20] 5 cmH20 Pressure Support:  [5 cmH20] 5 cmH20 Plateau Pressure:  [22 cmH20] 22 cmH20   Intake/Output Summary (Last 24 hours) at 11/12/2020 0805 Last data filed at 11/12/2020 0600 Gross per 24 hour  Intake 1977.26 ml  Output 4535 ml  Net -2557.74 ml   Filed Weights   11/10/20 0500 11/11/20 0500 11/12/20 0400  Weight: 105.6 kg 102.8 kg 102.3 kg    Examination: General: Ill-appearing man laying comfortably in bed. No acute distress HEENT: NCAT. MMM. ETT tube in place. Tracheal midline Lungs: Diminished breath sounds. No wheezes or rales. No increased WOB Cardiovascular: RRR, no m/g/r. 1+ BLE edema.  Abdomen:  soft, NT. Mild distention. Hypoactive BS.  Extremities: Well-perfused. Normal ROM Skin: Warm and dry. No obvious rashes or lesions Neuro: Alert and awake. Follows commands. Moves all 4 extremities   Resolved Hospital Problem list   AGMA  Assessment & Plan:    Acute Hypoxemic Respiratory Failure Requiring Mechanical Ventilation  (Failed extubation x 2, re-intubated on 3/16) CT chest 3/17 significant for multilobar pna. Continues to  require MV. Unable to wean off this morning so back on full support.  P: --Continue PRVC with TV of 36ml/kg --Continue SBT's --Titrate FiO2 to maintain SpO2 >90% --VAP protocol --PAD protocol, minimize sedation as able, goal RASS 0 to -1 --Mobilize with PT/OT as able --Consider intermittent trach collar as able --Slowly wean of sedation  Acute Metabolic Encephalopathy with Hypoactive Delirium  Hx of psychiatric disease Patient with acute psychosis on admission. On multiple psychoactive medications including Seroquel and Wellbutrin. UDS and head CT negative on admit, denies history of drug abuse but sister reports suspected use. Work up so far unremarkable. Mental status continues to improve.  P: --Continue low-dose Seroquel and Wellbutrin --Wean precedex and fentanyl with goal of RASS 0 to -1 --Delirium precautions --Consider re-consulting psych once of MV and close to baseline  Multilobar PNA, Aspiration pneumonitis Wedge Shaped Opacity on CXR 3/17 Leukocystosis White count and fever curve trending down. Diminished breath sounds on exam.  P: --Continue Cefepime x 7 days (Day 5 of 7) - MRSA swab negative --Pending tracheal aspirate --Daily CBC  Bilateral Gastrocnemius DVTs Suspected PE Provoked clot so will likely only need 3 months of anticoagulation. Mild BLE edema but no leg pain. HR improving. Will continue to monitor hemodynamics closely.  P: --Continue Lovenox for now --Consider switching back to Eliquis before discharge --Daily vitals  Hypertension Improved. Systolic BP in the 100s-130s overnight and this AM. Not on any antihypertensives at home. Followed at the [place]. P: --Continue Coreg, norvasc, hydralazine, clonidine --Daily vitals  AKI, rhabdo, improved Hypernatremia Hyperphosphatemia Electrolyte abnormalities likely multifactorial in the setting of recent rhabdomyolysis and AKI. Na up to 153 and creatinine down to 1.75 today.  P: --Increase free water to  150 ml q2h --Trend BMP/UOP --Avoid nephrotoxic agents --Keep MAPS above 65 for better renal perfusion. --strict I/Os, monitor electrolytes --Consider taking out dialysis catheter  Abd distention No signs of bowel obstruction on recent KUB. Abdomen remains distended. Making dark stools via rectal tube. Denies any abdominal pain. P: --Continue tube feeds via NG tube --Continue Reglan --Wean off fentanyl as tolerated --Continue bowel regimen  Anemia 2/2 critical illness Hgb stable at 8.3. No signs of active bleeding --Trend CBC --Continue vit B12 supplementation --Transfuse w/ Hgb goal of >7  Best practice:  Diet: NPO, Tube feeds Pain/Anxiety/Delirium protocol (if indicated): n/a VAP protocol (if indicated): Yes DVT prophylaxis: enoxaparin GI prophylaxis: protonix Glucose control: n/a Mobility: bedrest Disposition: ICU Family: Father, Gabriel Rung, updated via phone 3/17 on plan of care.   Labs   CBC: Recent Labs  Lab 11/08/20 0330 11/09/20 0237 11/09/20 1653 11/10/20 0354 11/11/20 0511 11/12/20 0436  WBC 14.2* 15.8*  --  11.5* 10.6* 11.9*  HGB 8.6* 8.9* 9.9* 8.9* 8.5* 8.3*  HCT 27.5* 27.9* 29.0* 27.9* 27.6* 26.1*  MCV 97.5 97.9  --  97.2 100.0 99.2  PLT 407* 492*  --  504* 503* 483*    Basic Metabolic Panel: Recent Labs  Lab 11/08/20 0330 11/09/20 0237 11/09/20 1653 11/10/20 0354 11/11/20 0511 11/12/20 0436  NA 150* 144 149* 146* 150* 153*  K 4.0 4.5 3.8 4.5 3.5 3.9  CL 112* 110  --  113* 117* 118*  CO2 28 23  --  23 24 26   GLUCOSE 128* 100*  --  129* 116* 118*  BUN 59* 54*  --  52* 42* 40*  CREATININE 2.79* 2.53*  --  2.28* 1.83* 1.75*  CALCIUM 8.9 8.8*  --  8.5* 9.0 8.6*  MG 1.9  --   --   --   --   --    GFR: Estimated Creatinine Clearance: 65.9 mL/min (A) (by C-G formula based on SCr of 1.75 mg/dL (H)). Recent Labs  Lab 11/06/20 0416 11/06/20 1036 11/06/20 1257 11/07/20 0140 11/09/20 0237 11/10/20 0354 11/11/20 0511 11/12/20 0436  PROCALCITON  0.42  --   --   --  0.71 0.49 0.27  --   WBC 13.9*  --   --    < > 15.8* 11.5* 10.6* 11.9*  LATICACIDVEN  --  1.5 1.1  --   --   --   --   --    < > = values in this interval not displayed.    Liver Function Tests: Recent Labs  Lab 11/12/20 0436  AST 27  ALT 18  ALKPHOS 60  BILITOT 0.7  PROT 6.0*  ALBUMIN 2.8*   No results for input(s): LIPASE, AMYLASE in the last 168 hours. No results for input(s): AMMONIA in the last 168 hours.  ABG    Component Value Date/Time   PHART 7.353 11/09/2020 1653   PCO2ART 45.3 11/09/2020 1653   PO2ART 476 (H) 11/09/2020 1653   HCO3 24.9 11/09/2020 1653   TCO2 26 11/09/2020 1653   ACIDBASEDEF 1.0 11/09/2020 1653   O2SAT 100.0 11/09/2020 1653     Coagulation Profile: No results for input(s): INR, PROTIME in the last 168 hours.  Cardiac Enzymes: Recent Labs  Lab 11/07/20 0140 11/08/20 0330 11/09/20 0915 11/10/20 0354  CKTOTAL 540* 359 338 211    HbA1C: No results found for: HGBA1C  CBG: Recent Labs  Lab 11/11/20 1626 11/11/20 1933 11/11/20 2305 11/12/20 0314 11/12/20 0740  GLUCAP 114* 114* 79 115* 108*   Past Medical History  He,  has no past medical history on file.   Surgical History   History reviewed. No pertinent surgical history.   Social History      Family History   His family history is not on file.   Allergies No Known Allergies   Home Medications  Prior to Admission medications   Medication Sig Start Date End Date Taking? Authorizing Provider  buPROPion (WELLBUTRIN) 100 MG tablet Take 100 mg by mouth daily.   Yes [provider]  QUEtiapine (SEROQUEL) 400 MG tablet Take 400 mg by mouth at bedtime.   Yes [provider]     Critical care time: 30

## 2020-11-12 NOTE — Progress Notes (Signed)
SLP Cancellation Note  Patient Details Name: Andrew [name] MRN: [id number] DOB: August 02, 1981   Cancelled treatment:        Orders received for PMV, swallow. Trach'd yesterday, vent/sedated. Will follow progress.    Royce Macadamia 11/12/2020, 8:46 AM  Breck Coons Lonell Face.Ed Nurse, children's [phone] Office [phone]

## 2020-11-13 LAB — GLUCOSE, CAPILLARY
Glucose-Capillary: 100 mg/dL — ABNORMAL HIGH (ref 70–99)
Glucose-Capillary: 105 mg/dL — ABNORMAL HIGH (ref 70–99)
Glucose-Capillary: 106 mg/dL — ABNORMAL HIGH (ref 70–99)
Glucose-Capillary: 106 mg/dL — ABNORMAL HIGH (ref 70–99)
Glucose-Capillary: 110 mg/dL — ABNORMAL HIGH (ref 70–99)
Glucose-Capillary: 121 mg/dL — ABNORMAL HIGH (ref 70–99)

## 2020-11-13 LAB — COMPREHENSIVE METABOLIC PANEL
ALT: 22 U/L (ref 0–44)
AST: 30 U/L (ref 15–41)
Albumin: 3.3 g/dL — ABNORMAL LOW (ref 3.5–5.0)
Alkaline Phosphatase: 69 U/L (ref 38–126)
Anion gap: 12 (ref 5–15)
BUN: 27 mg/dL — ABNORMAL HIGH (ref 6–20)
CO2: 25 mmol/L (ref 22–32)
Calcium: 8.9 mg/dL (ref 8.9–10.3)
Chloride: 116 mmol/L — ABNORMAL HIGH (ref 98–111)
Creatinine, Ser: 1.43 mg/dL — ABNORMAL HIGH (ref 0.61–1.24)
GFR, Estimated: >60 mL/min (ref >60)
Glucose, Bld: 120 mg/dL — ABNORMAL HIGH (ref 70–99)
Potassium: 2.6 mmol/L — CL (ref 3.5–5.1)
Sodium: 153 mmol/L — ABNORMAL HIGH (ref 135–145)
Total Bilirubin: 0.5 mg/dL (ref 0.3–1.2)
Total Protein: 7 g/dL (ref 6.5–8.1)

## 2020-11-13 MED ORDER — DEXMEDETOMIDINE HCL IN NACL 400 MCG/100ML IV SOLN
0.4000 ug/kg/h | INTRAVENOUS | Status: DC
  Administered 2020-11-13: 0.6 ug/kg/h via INTRAVENOUS
  Administered 2020-11-13: 0.4 ug/kg/h via INTRAVENOUS
  Administered 2020-11-14: 0.2 ug/kg/h via INTRAVENOUS
  Administered 2020-11-15: 0.6 ug/kg/h via INTRAVENOUS
  Filled 2020-11-13 (×4): qty 100

## 2020-11-13 MED ORDER — "THROMBI-PAD 3""X3"" EX PADS"
1.0000 | MEDICATED_PAD | CUTANEOUS | Status: AC
  Administered 2020-11-13: 1 via TOPICAL
  Filled 2020-11-13: qty 1

## 2020-11-13 MED ORDER — POTASSIUM CHLORIDE 10 MEQ/100ML IV SOLN
10.0000 meq | INTRAVENOUS | Status: AC
  Administered 2020-11-13 (×4): 10 meq via INTRAVENOUS
  Filled 2020-11-13 (×4): qty 100

## 2020-11-13 MED ORDER — POTASSIUM CHLORIDE 20 MEQ PO PACK
40.0000 meq | PACK | Freq: Once | ORAL | Status: AC
  Administered 2020-11-13: 40 meq
  Filled 2020-11-13: qty 2

## 2020-11-13 MED ORDER — FREE WATER
200.0000 mL | Status: DC
  Administered 2020-11-13 – 2020-11-15 (×23): 200 mL

## 2020-11-13 NOTE — Progress Notes (Signed)
K 2.6 Electrolytes replaced per protocol

## 2020-11-13 NOTE — Progress Notes (Signed)
Per PCCM, okay to keep pt on ATC overnight if tolerated. Vent orders changed to PRN.

## 2020-11-13 NOTE — Progress Notes (Signed)
eLink Physician-Brief Progress Note Patient Name: Andrew [name] DOB: Sep 29, 1980 MRN: [id number]   Date of Service  11/13/2020  HPI/Events of Note  Patient oozing from trach site and on full dose Lovenox for presumption of PE.   eICU Interventions  Plan: 1. Apply Thrombi-pad to trach site.      Intervention Category Major Interventions: Hemorrhage - evaluation and management  [name] 11/13/2020, 10:22 PM

## 2020-11-13 NOTE — Progress Notes (Signed)
NAME:  Andrew [name], MRN:  [id number], DOB:  05/06/1981, LOS: 17 ADMISSION DATE:  10/26/2020, CONSULTATION DATE:  10/27/2020 REFERRING MD:  ER CHIEF COMPLAINT: Respiratory Failure  Brief History   Andrew [name] is a 40 y/o male admitted on 3/2 with chest pain & acute hyperactive delirium requiring mechanical ventilation. Subsequently found to be in rhabdo with AKI requiring HD. Concern Wellbutrin, Seroquel contributing. UDS negative x2, CSF negative, MRI negative. Extubated on 3/14 and re-intubated for the third time 3/16. Tracheostomy on 3/18.   Past Medical History  PTSD Depression  Significant Hospital Events   3/04 extubated, AKI worse, found to be in rhabdo 3/05 Continues to have diffuse muscle pain, renal consult 3/06 increased body pain particularly right shoulder, concern for septic arthritis, MRI neg 3/13 Abd distention, cuff leak, bloody drainage around ETT  3/14 Weaning on PSV, remains on high dose sedation, extubated, delirium overnight on dex 3/15 Episode of desaturation after eating, suspected silent aspiration. Vasovagal event with ice water 3/16 Re-intubated, remains on esmolol gtt / resistant HTN 3/18 Tracheostomy  Consults:  Renal Psych Neuro  Procedures:  ETT 3/3  >> 3/4 ETT 3/6 >> 3/14 HD catheter 3/9 >> 3/15 ETT 3/16 >> Trach 3/18 >>  Significant Diagnostic Tests:   Head CT 3/3 >> sphenoid sinusitis  Echo 3/3 >> nml LVEF, no WMA  MRI right shoulder 3/6 >> no evidence of septic joint, no effusion, severe edema and muscles around shoulder joint consistent with rhabdo  Head CT 3/6 >> neg  CSF cell count 3/7 >> WBC 2, RBC 2 (hypocellular)  MRI brain 3/7 >> normal, paranasal sinus mucosal thickening, small mastoid effusions  BLLE Ultrasound 3/14  >> BL DVT gastrocnemius  Limited ECHO 3/15 >> EF 60, no evidence of RV strain  Chest CT 3/17 >> Severe multilobar bilateral bronchopneumonia, trace R pleural effusion   Micro Data:  COVID 3/3 >>  negative Influenza 3/3 >> negative Resp 3/3 >> nml flora Blood 3/3 >> negative Blood 3/6 >> negative CSF 3/7 >> negative  Fecal lactoferrin 3/11 >> negative UA 3/14 >> +Ketones, Neg MRSA swab 3/17 >> Trach asp 3/17 >> pending  Antimicrobials:  Ceftriaxone 3/3 >> 3/6 Azithromycin 3/3 x 1 Cefazolin 3/6 >> 3/7 Ceftriaxone 3/7 >> 3/9 Vanc 3/6  X 1 dose Vanc 3/7 >> 3/8 Ceftriaxone 3/13 >> 3/15 Cefepime 3/16 >>  Interim history/subjective:  3/20: awake and following commands. Placed on cpap and doing well but anxious. Placed subsequently on atc and anxiety increased so started low dose precedex and pt has been tolerating atc (update on atc this early this am) will cont with trach collar as long as tolerated with vent prn.   Objective   Blood pressure (!) 163/94, pulse (!) 108, temperature 99.1 F (37.3 C), temperature source Oral, resp. rate 18, height 5\' 10"  (1.778 m), weight 98.9 kg, SpO2 100 %.    Vent Mode: PSV;CPAP FiO2 (%):  [21 %-30 %] 21 % Set Rate:  [16 bmp] 16 bmp Vt Set:  [580 mL] 580 mL PEEP:  [5 cmH20] 5 cmH20 Pressure Support:  [5 cmH20] 5 cmH20 Plateau Pressure:  [24 cmH20] 24 cmH20   Intake/Output Summary (Last 24 hours) at 11/13/2020 1312 Last data filed at 11/13/2020 1200 Gross per 24 hour  Intake 3294.83 ml  Output 3085 ml  Net 209.83 ml   Filed Weights   11/11/20 0500 11/12/20 0400 11/13/20 0400  Weight: 102.8 kg 102.3 kg 98.9 kg    Examination: General: NAD,  sitting up in bed on ATC HEENT: NCAT. MMM. Trach in place Lungs: Diminished breath sounds. No wheezes or rales. No increased WOB Cardiovascular: RRR, no m/g/r. 1+ BLE edema.  Abdomen: soft, NT. Mild distention. Hypoactive BS.  Extremities: Well-perfused. Normal ROM Skin: Warm and dry. No obvious rashes or lesions Neuro: Alert and awake. Follows commands. Moves all 4 extremities, slightly anxious   Resolved Hospital Problem list   AGMA  Assessment & Plan:    Acute Hypoxemic Respiratory  Failure Requiring Mechanical Ventilation  (Failed extubation x 2, re-intubated on 3/16) -s/p trach -cont with atc as tolerated -requiring precedex at this time but will slowly wean off once he understands his progress.   Acute Metabolic Encephalopathy with Hypoactive Delirium  Hx of psychiatric disease Patient with acute psychosis on admission. On multiple psychoactive medications including Seroquel and Wellbutrin. UDS and head CT negative on admit, denies history of drug abuse but sister reports suspected use. Work up so far unremarkable. Mental status continues to improve.  P: --Continue low-dose Seroquel and Wellbutrin --cont precedex for today to keep off vent as tolerates and then wean precedex --Delirium precautions --Consider re-consulting psych once of MV and close to baseline  Multilobar PNA, Aspiration pneumonitis Wedge Shaped Opacity on CXR 3/17 Leukocystosis White count and fever curve trending down. Diminished breath sounds on exam.  P: --Continue Cefepime x 7 days (Day 6 of 7) - MRSA swab negative --Daily CBC  Bilateral Gastrocnemius DVTs Suspected PE Provoked clot so will likely only need 3 months of anticoagulation. Mild BLE edema but no leg pain. HR improving. Will continue to monitor hemodynamics closely.  P: --Continue Lovenox for now --Consider switching back to Eliquis before discharge --Daily vitals  Hypertension Improved. Systolic BP in the 100s-130s overnight and this AM. Not on any antihypertensives at home. Followed at the [place]. P: --Continue Coreg, norvasc, hydralazine, clonidine --Daily vitals  AKI, rhabdo, improved Hypernatremia Hyperphosphatemia Electrolyte abnormalities likely multifactorial in the setting of recent rhabdomyolysis and AKI. Na up to 153 and creatinine down to 1.75 today.  P: --cont free water at increased rate, if still not improved will do d5w tomorrow.  --Trend BMP/UOP --Avoid nephrotoxic agents --Keep MAPS above 65 for  better renal perfusion. --strict I/Os, monitor electrolytes --removed hd catheter yesterday 3/19 -recheck ck tomorrow am with resumption of home meds to ensure stability  Abd distention No signs of bowel obstruction on recent KUB. Abdomen remains distended. Making dark stools via rectal tube. Denies any abdominal pain. P: --Continue tube feeds via cortrak --Consider d/c reglan as he cont to improve clinically, becomes more mobile and fentanyl off.  --Continue bowel regimen  Anemia 2/2 critical illness -no cbc today, will recheck tomorrow for q48 trend --Continue vit B12 supplementation --Transfuse w/ Hgb goal of >7  Best practice:  Diet: NPO, Tube feeds Pain/Anxiety/Delirium protocol (if indicated): n/a VAP protocol (if indicated): Yes DVT prophylaxis: enoxaparin GI prophylaxis: protonix Glucose control: n/a Mobility: bedrest Disposition: ICU Family: Father, Gabriel Rung, updated via phone 3/17 on plan of care.   Labs   CBC: Recent Labs  Lab 11/08/20 0330 11/09/20 0237 11/09/20 1653 11/10/20 0354 11/11/20 0511 11/12/20 0436  WBC 14.2* 15.8*  --  11.5* 10.6* 11.9*  HGB 8.6* 8.9* 9.9* 8.9* 8.5* 8.3*  HCT 27.5* 27.9* 29.0* 27.9* 27.6* 26.1*  MCV 97.5 97.9  --  97.2 100.0 99.2  PLT 407* 492*  --  504* 503* 483*    Basic Metabolic Panel: Recent Labs  Lab 11/08/20 0330 11/09/20 0237  11/09/20 1653 11/10/20 0354 11/11/20 0511 11/12/20 0436 11/13/20 0238  NA 150* 144 149* 146* 150* 153* 153*  K 4.0 4.5 3.8 4.5 3.5 3.9 2.6*  CL 112* 110  --  113* 117* 118* 116*  CO2 28 23  --  23 24 26 25   GLUCOSE 128* 100*  --  129* 116* 118* 120*  BUN 59* 54*  --  52* 42* 40* 27*  CREATININE 2.79* 2.53*  --  2.28* 1.83* 1.75* 1.43*  CALCIUM 8.9 8.8*  --  8.5* 9.0 8.6* 8.9  MG 1.9  --   --   --   --   --   --    GFR: Estimated Creatinine Clearance: 79.4 mL/min (A) (by C-G formula based on SCr of 1.43 mg/dL (H)). Recent Labs  Lab 11/09/20 0237 11/10/20 0354 11/11/20 0511  11/12/20 0436  PROCALCITON 0.71 0.49 0.27  --   WBC 15.8* 11.5* 10.6* 11.9*    Liver Function Tests: Recent Labs  Lab 11/12/20 0436 11/13/20 0238  AST 27 30  ALT 18 22  ALKPHOS 60 69  BILITOT 0.7 0.5  PROT 6.0* 7.0  ALBUMIN 2.8* 3.3*   No results for input(s): LIPASE, AMYLASE in the last 168 hours. No results for input(s): AMMONIA in the last 168 hours.  ABG    Component Value Date/Time   PHART 7.353 11/09/2020 1653   PCO2ART 45.3 11/09/2020 1653   PO2ART 476 (H) 11/09/2020 1653   HCO3 24.9 11/09/2020 1653   TCO2 26 11/09/2020 1653   ACIDBASEDEF 1.0 11/09/2020 1653   O2SAT 100.0 11/09/2020 1653     Coagulation Profile: No results for input(s): INR, PROTIME in the last 168 hours.  Cardiac Enzymes: Recent Labs  Lab 11/07/20 0140 11/08/20 0330 11/09/20 0915 11/10/20 0354  CKTOTAL 540* 359 338 211    HbA1C: No results found for: HGBA1C  CBG: Recent Labs  Lab 11/12/20 2018 11/12/20 2312 11/13/20 0440 11/13/20 0746 11/13/20 1146  GLUCAP 115* 101* 106* 106* 110*    Critical care time: The patient is critically ill with multiple organ systems failure and requires high complexity decision making for assessment and support, frequent evaluation and titration of therapies, application of advanced monitoring technologies and extensive interpretation of multiple databases.  Critical care time 38 mins. This represents my time independent of the NPs time taking care of the pt. This is excluding procedures.    11/15/20 DO [facility] 11/13/2020, 2:15 PM See Amion for pager If no response to pager, please call [phone] until 1900 After 1900 please call [facility] [phone]

## 2020-11-14 DIAGNOSIS — Z930 Tracheostomy status: Secondary | ICD-10-CM

## 2020-11-14 LAB — COMPREHENSIVE METABOLIC PANEL
ALT: 19 U/L (ref 0–44)
AST: 32 U/L (ref 15–41)
Albumin: 3.2 g/dL — ABNORMAL LOW (ref 3.5–5.0)
Alkaline Phosphatase: 62 U/L (ref 38–126)
Anion gap: 8 (ref 5–15)
BUN: 20 mg/dL (ref 6–20)
CO2: 24 mmol/L (ref 22–32)
Calcium: 8.6 mg/dL — ABNORMAL LOW (ref 8.9–10.3)
Chloride: 118 mmol/L — ABNORMAL HIGH (ref 98–111)
Creatinine, Ser: 1.19 mg/dL (ref 0.61–1.24)
GFR, Estimated: >60 mL/min (ref >60)
Glucose, Bld: 131 mg/dL — ABNORMAL HIGH (ref 70–99)
Potassium: 3.3 mmol/L — ABNORMAL LOW (ref 3.5–5.1)
Sodium: 150 mmol/L — ABNORMAL HIGH (ref 135–145)
Total Bilirubin: 0.5 mg/dL (ref 0.3–1.2)
Total Protein: 6.8 g/dL (ref 6.5–8.1)

## 2020-11-14 LAB — BASIC METABOLIC PANEL
Anion gap: 9 (ref 5–15)
BUN: 22 mg/dL — ABNORMAL HIGH (ref 6–20)
CO2: 20 mmol/L — ABNORMAL LOW (ref 22–32)
Calcium: 8.7 mg/dL — ABNORMAL LOW (ref 8.9–10.3)
Chloride: 117 mmol/L — ABNORMAL HIGH (ref 98–111)
Creatinine, Ser: 1.18 mg/dL (ref 0.61–1.24)
GFR, Estimated: >60 mL/min (ref >60)
Glucose, Bld: 116 mg/dL — ABNORMAL HIGH (ref 70–99)
Potassium: 3.9 mmol/L (ref 3.5–5.1)
Sodium: 146 mmol/L — ABNORMAL HIGH (ref 135–145)

## 2020-11-14 LAB — CBC
HCT: 29.9 % — ABNORMAL LOW (ref 39.0–52.0)
Hemoglobin: 9.6 g/dL — ABNORMAL LOW (ref 13.0–17.0)
MCH: 31 pg (ref 26.0–34.0)
MCHC: 32.1 g/dL (ref 30.0–36.0)
MCV: 96.5 fL (ref 80.0–100.0)
Platelets: 561 10*3/uL — ABNORMAL HIGH (ref 150–400)
RBC: 3.1 MIL/uL — ABNORMAL LOW (ref 4.22–5.81)
RDW: 13.5 % (ref 11.5–15.5)
WBC: 10.5 10*3/uL (ref 4.0–10.5)
nRBC: 0 % (ref 0.0–0.2)

## 2020-11-14 LAB — GLUCOSE, CAPILLARY
Glucose-Capillary: 104 mg/dL — ABNORMAL HIGH (ref 70–99)
Glucose-Capillary: 107 mg/dL — ABNORMAL HIGH (ref 70–99)
Glucose-Capillary: 107 mg/dL — ABNORMAL HIGH (ref 70–99)
Glucose-Capillary: 116 mg/dL — ABNORMAL HIGH (ref 70–99)
Glucose-Capillary: 127 mg/dL — ABNORMAL HIGH (ref 70–99)

## 2020-11-14 LAB — MAGNESIUM: Magnesium: 1.9 mg/dL (ref 1.7–2.4)

## 2020-11-14 LAB — CK: Total CK: 560 U/L — ABNORMAL HIGH (ref 49–397)

## 2020-11-14 MED ORDER — CARVEDILOL 25 MG PO TABS
37.5000 mg | ORAL_TABLET | Freq: Two times a day (BID) | ORAL | Status: DC
  Administered 2020-11-14: 37.5 mg
  Filled 2020-11-14 (×2): qty 1

## 2020-11-14 MED ORDER — POTASSIUM CHLORIDE 20 MEQ PO PACK
20.0000 meq | PACK | ORAL | Status: AC
  Administered 2020-11-14 (×2): 20 meq
  Filled 2020-11-14 (×2): qty 1

## 2020-11-14 MED ORDER — ENOXAPARIN SODIUM 100 MG/ML ~~LOC~~ SOLN
95.0000 mg | Freq: Two times a day (BID) | SUBCUTANEOUS | Status: DC
  Administered 2020-11-14: 95 mg via SUBCUTANEOUS
  Filled 2020-11-14 (×2): qty 0.95

## 2020-11-14 MED ORDER — CLONAZEPAM 0.5 MG PO TBDP
0.5000 mg | ORAL_TABLET | Freq: Two times a day (BID) | ORAL | Status: DC | PRN
  Administered 2020-11-14 – 2020-11-15 (×2): 0.5 mg
  Filled 2020-11-14 (×2): qty 1

## 2020-11-14 MED ORDER — HALOPERIDOL LACTATE 5 MG/ML IJ SOLN
5.0000 mg | Freq: Once | INTRAMUSCULAR | Status: AC
  Administered 2020-11-14: 5 mg via INTRAVENOUS
  Filled 2020-11-14: qty 1

## 2020-11-14 MED ORDER — POTASSIUM CHLORIDE 10 MEQ/100ML IV SOLN
10.0000 meq | INTRAVENOUS | Status: AC
  Administered 2020-11-14 (×4): 10 meq via INTRAVENOUS
  Filled 2020-11-14 (×4): qty 100

## 2020-11-14 NOTE — Progress Notes (Signed)
Pt very agitated violently moving in bed and attempting to kick staff. Pt not wanting to be touched at this time. Lab unable to draw blood and 1600 CBG and temp unable to be obtained at this time.

## 2020-11-14 NOTE — Progress Notes (Signed)
NAME:  Andrew [name], MRN:  [id number], DOB:  June 02, 1981, LOS: 18 ADMISSION DATE:  10/26/2020, CONSULTATION DATE:  10/27/2020 REFERRING MD:  ER CHIEF COMPLAINT: Respiratory Failure  Brief History   Mr. Stupka is a 40 y/o male admitted on 3/2 with chest pain & acute hyperactive delirium requiring mechanical ventilation. Subsequently found to be in rhabdo with AKI requiring HD. Concern Wellbutrin, Seroquel contributing. UDS negative x2, CSF negative, MRI negative. Extubated on 3/14 and re-intubated for the third time 3/16. Tracheostomy on 3/18.   Past Medical History  PTSD Depression  Significant Hospital Events   3/04 extubated, AKI worse, found to be in rhabdo 3/05 Continues to have diffuse muscle pain, renal consult 3/06 increased body pain particularly right shoulder, concern for septic arthritis, MRI neg 3/13 Abd distention, cuff leak, bloody drainage around ETT  3/14 Weaning on PSV, remains on high dose sedation, extubated, delirium overnight on dex 3/15 Episode of desaturation after eating, suspected silent aspiration. Vasovagal event with ice water 3/16 Re-intubated, remains on esmolol gtt / resistant HTN 3/18 Tracheostomy  Consults:  Renal Psych Neuro  Procedures:  ETT 3/3  >> 3/4 ETT 3/6 >> 3/14 HD catheter 3/9 >> 3/15 ETT 3/16 >> Trach 3/18 >>  Significant Diagnostic Tests:   Head CT 3/3 >> sphenoid sinusitis  Echo 3/3 >> nml LVEF, no WMA  MRI right shoulder 3/6 >> no evidence of septic joint, no effusion, severe edema and muscles around shoulder joint consistent with rhabdo  Head CT 3/6 >> neg  CSF cell count 3/7 >> WBC 2, RBC 2 (hypocellular)  MRI brain 3/7 >> normal, paranasal sinus mucosal thickening, small mastoid effusions  BLLE Ultrasound 3/14  >> BL DVT gastrocnemius  Limited ECHO 3/15 >> EF 60, no evidence of RV strain  Chest CT 3/17 >> Severe multilobar bilateral bronchopneumonia, trace R pleural effusion   Micro Data:  COVID 3/3 >>  negative Influenza 3/3 >> negative Resp 3/3 >> nml flora Blood 3/3 >> negative Blood 3/6 >> negative CSF 3/7 >> negative  Fecal lactoferrin 3/11 >> negative UA 3/14 >> +Ketones, Neg MRSA swab 3/17 >> Trach asp 3/17 >> pending  Antimicrobials:  Ceftriaxone 3/3 >> 3/6 Azithromycin 3/3 x 1 Cefazolin 3/6 >> 3/7 Ceftriaxone 3/7 >> 3/9 Vanc 3/6  X 1 dose Vanc 3/7 >> 3/8 Ceftriaxone 3/13 >> 3/15 Cefepime 3/16 >>  Interim history/subjective:   3/21: Stable on trach collar overnight. Appropriate O2 sats this morning Weaned off Precedex this morning. Continues to have some anxiety.   Objective   Blood pressure (!) 162/93, pulse (!) 120, temperature 99.5 F (37.5 C), resp. rate (!) 21, height 5\' 10"  (1.778 m), weight 93.8 kg, SpO2 100 %.    Vent Mode: PSV;CPAP FiO2 (%):  [21 %-30 %] 21 % PEEP:  [5 cmH20] 5 cmH20 Pressure Support:  [5 cmH20] 5 cmH20   Intake/Output Summary (Last 24 hours) at 11/14/2020 0648 Last data filed at 11/14/2020 11/16/2020 Gross per 24 hour  Intake 3470.23 ml  Output 4675 ml  Net -1204.77 ml   Filed Weights   11/12/20 0400 11/13/20 0400 11/14/20 0400  Weight: 102.3 kg 98.9 kg 93.8 kg    Examination: General: Ill-appearing man laying comfortably in bed. NAD HEENT: NCAT. MMM. Trach in place Lungs: Diminished breath sounds. No wheezes or rales. No increased WOB Cardiovascular: Tachycardic. Regular rhythm. No m/g/r. 1+ BLE edema.  Abdomen: soft, NT. Mild distention. Hypoactive BS.  Extremities: Well-perfused. Normal ROM Skin: Warm and dry. No obvious  rashes or lesions Neuro: Alert and Awake. Follows commands and spontaneously moves all extremities.  Psych: Anxious mood  Resolved Hospital Problem list   AGMA  Assessment & Plan:    Acute Hypoxemic Respiratory Failure Requiring Mechanical Ventilation  (Failed extubation x 2, re-intubated on 3/16) --s/p trach --On trach collar --Weaned off precedex this morning. Can add low dose klonopin if anxiety  worsens --PT recommending CIR  Acute Metabolic Encephalopathy with Hypoactive Delirium  Hx of psychiatric disease Patient with acute psychosis on admission. On multiple psychoactive medications including Seroquel and Wellbutrin. UDS and head CT negative on admit, denies history of drug abuse but sister reports suspected use. Work up so far unremarkable. Mental status improved and off vent.  P: --Continue low-dose Seroquel and Wellbutrin --Can add PRN Klonopin for anxiety --Delirium precautions --Consider re-consulting psych for further recommendations  Multilobar PNA, Aspiration pneumonitis Wedge Shaped Opacity on CXR 3/17 Leukocystosis, resolved White count now normal at 10.5. Continues to be afebrile. Lung sounds diminished but clear to auscultation. O2 sats 99-100 on just trach collar.  P: --Continue Cefepime x 7 days (Day 6 of 7) - MRSA swab negative --Daily CBC  Bilateral Gastrocnemius DVTs Suspected PE Provoked clot so will likely only need 3 months of anticoagulation. Closely monitor for any worsening respiratory distress. Denies any CP, palpitations or leg pain.  P: --Continue Lovenox for now --Consider switching back to Eliquis before discharge --Daily vitals  Hypertension BP still elevated. Continue to monitor and modify antihypertensive agents if still elevated tomorrow.  P: --Continue Coreg, norvasc, hydralazine, clonidine --Daily vitals  AKI, rhabdo, improved Hypernatremia Hyperphosphatemia Electrolyte abnormalities likely multifactorial in the setting of recent rhabdomyolysis and AKI. Na slowing trending down. Cr now wnl. CK slightly up today but patient asymptomatic.  P: --Continue free water at 200 q2hr --Trend BMP/UOP --Avoid nephrotoxic agents --Keep MAPS above 65 for better renal perfusion. --strict I/Os, monitor electrolytes  Abd distention Improving. No signs of bowel obstruction on recent KUB. Continues to deny abd pain.  P: --Continue tube  feeds via cortrak --SLP eval --Consider d/c reglan as he cont to improve clinically, becomes more mobile and fentanyl off.  --Continue bowel regimen  Anemia 2/2 critical illness Hgb stable at 9.6 today. Will continue to trend CBC --Continue vit B12 supplementation --Transfuse w/ Hgb goal of >7  Best practice:  Diet: NPO, Tube feeds Pain/Anxiety/Delirium protocol (if indicated): n/a VAP protocol (if indicated): Yes DVT prophylaxis: enoxaparin GI prophylaxis: protonix Glucose control: n/a Mobility: OOB Disposition: Pending transfer to floor/CIR Family: Father, Gabriel Rung, updated via phone 3/17 on plan of care.   Labs   CBC: Recent Labs  Lab 11/09/20 0237 11/09/20 1653 11/10/20 0354 11/11/20 0511 11/12/20 0436 11/14/20 0033  WBC 15.8*  --  11.5* 10.6* 11.9* 10.5  HGB 8.9* 9.9* 8.9* 8.5* 8.3* 9.6*  HCT 27.9* 29.0* 27.9* 27.6* 26.1* 29.9*  MCV 97.9  --  97.2 100.0 99.2 96.5  PLT 492*  --  504* 503* 483* 561*    Basic Metabolic Panel: Recent Labs  Lab 11/08/20 0330 11/09/20 0237 11/10/20 0354 11/11/20 0511 11/12/20 0436 11/13/20 0238 11/14/20 0033  NA 150*   < > 146* 150* 153* 153* 150*  K 4.0   < > 4.5 3.5 3.9 2.6* 3.3*  CL 112*   < > 113* 117* 118* 116* 118*  CO2 28   < > 23 24 26 25 24   GLUCOSE 128*   < > 129* 116* 118* 120* 131*  BUN 59*   < >  52* 42* 40* 27* 20  CREATININE 2.79*   < > 2.28* 1.83* 1.75* 1.43* 1.19  CALCIUM 8.9   < > 8.5* 9.0 8.6* 8.9 8.6*  MG 1.9  --   --   --   --   --   --    < > = values in this interval not displayed.   GFR: Estimated Creatinine Clearance: 93 mL/min (by C-G formula based on SCr of 1.19 mg/dL). Recent Labs  Lab 11/09/20 0237 11/10/20 0354 11/11/20 0511 11/12/20 0436 11/14/20 0033  PROCALCITON 0.71 0.49 0.27  --   --   WBC 15.8* 11.5* 10.6* 11.9* 10.5    Liver Function Tests: Recent Labs  Lab 11/12/20 0436 11/13/20 0238 11/14/20 0033  AST 27 30 32  ALT 18 22 19   ALKPHOS 60 69 62  BILITOT 0.7 0.5 0.5  PROT  6.0* 7.0 6.8  ALBUMIN 2.8* 3.3* 3.2*   No results for input(s): LIPASE, AMYLASE in the last 168 hours. No results for input(s): AMMONIA in the last 168 hours.  ABG    Component Value Date/Time   PHART 7.353 11/09/2020 1653   PCO2ART 45.3 11/09/2020 1653   PO2ART 476 (H) 11/09/2020 1653   HCO3 24.9 11/09/2020 1653   TCO2 26 11/09/2020 1653   ACIDBASEDEF 1.0 11/09/2020 1653   O2SAT 100.0 11/09/2020 1653     Coagulation Profile: No results for input(s): INR, PROTIME in the last 168 hours.  Cardiac Enzymes: Recent Labs  Lab 11/08/20 0330 11/09/20 0915 11/10/20 0354 11/14/20 0033  CKTOTAL 359 338 211 560*    HbA1C: No results found for: HGBA1C  CBG: Recent Labs  Lab 11/13/20 1146 11/13/20 1543 11/13/20 2005 11/13/20 2342 11/14/20 0434  GLUCAP 110* 100* 105* 121* 107*    Critical care time: 25

## 2020-11-14 NOTE — Evaluation (Signed)
[facility] - Evaluation Patient Details  Name: Andrew [name] MRN: [id number] Date of Birth: [birth year]  Today's Date: 11/14/2020 Time: 7322-0254 SLP Time Calculation (min) (ACUTE ONLY): 16 min  Past Medical History: History reviewed. No pertinent past medical history. Past Surgical History: History reviewed. No pertinent surgical history. HPI:  Pt is a 40 y/o male with PMH PTSD, and anxiety. He was admitted 3/2 with two weeks of chest pain.  Pt was emergently intubated 3/2-3/4. EEG suggested moderate, diffuse encephalopathy without seizure activity. Found to be in rhabdo with AKI. Pt reintubated 3/6-3/14 for airway protection due to hypoactive delirium. BSE 3/15: no signs of aspiration or dysphagia. He both passed a 3 oz water swallow with RN and SLP. A clear liquid diet had been started by MD at that time and SLP recommended advancement per critical care. Episode of desaturation after eating on 3/15, MD suspected silent aspiration. Vasovagal event with ice water. Pt re-intubated 3/17-trach 3/18.  CXR 3/18: Patchy interstitial and airspace opacities persist compatible with multi lobar pneumonia.   Assessment / Plan / Recommendation Clinical Impression  Pt was seen for PMSV evaluation. Pt was educated regarding the anatomy of the larynx, the impact of the trach on voicing, and the goals of the session. Pt indicated understanding. Cuff was inflated upon SLP's arrival and pt tolerated cuff deflation well. Pt's RN, Lequita Halt, was present to perform tracheal suctioning, but it did not appear to be necessary and considering pt's baseline blood-tingued secretions, it was agreed to defer tracheal suctioning. He presented with vitals of RR 17, SpO2 96, and HR 129 at baseline and RR 10, SpO2 96, and HR 128 following cuff deflation. Pt tolerated finger occlusion without evidence of air trapping. He was able to produce single words with finger occlusion. He tolerated PMSV for 6 minutes with vitals  ranging RR 25-29, SpO2 96-97, and HR 126-128. Pt was able to expectorate secretions orally following PMSV placement. Vocal intensity was [facility]. Vocal quality could not be adequately assessed due to pt's limited verbal output. After six minutes of placement, pt closed his eyes and refused to respond to questions or to participate further after production of limited single-word utterances. The PMSV was removed thereafter. Case was discussed with RN and it was agreed that the pt's cuff would be left deflated. PMSV may be used with SLP only at this time. SLP will continue to follow pt. SLP Visit Diagnosis: Aphonia (R49.1)    SLP Assessment  Patient needs continued Speech Lanaguage Pathology Services    Follow Up Recommendations  Inpatient Rehab    Frequency and Duration min 2x/week  2 weeks    PMSV Trial PMSV was placed for: 6 Able to redirect subglottic air through upper airway: Yes Able to Attain Phonation: Yes Voice Quality: Normal (though assessment was limited.) Able to Expectorate Secretions: Yes Level of Secretion Expectoration with PMSV: Oral Breath Support for Phonation:  (difficult to assess) Intelligibility: Intelligible Respirations During Trial:  (25-29) SpO2 During Trial:  (96-97) Pulse During Trial:  (126-128) Behavior: Poor eye contact;Lethargic (minimal attempts tp communicate)   Tracheostomy Tube       Vent Dependency  FiO2 (%): 21 %    Cuff Deflation Trial    Shanika I. Vear Clock, MS, CCC-SLP Acute Rehabilitation Services Office number [phone] Pager [phone]  Tolerated Cuff Deflation: Yes Length of Time for Cuff Deflation Trial: 15 Behavior: Responsive to questions (initially)        Scheryl Marten 11/14/2020, 4:48 PM

## 2020-11-14 NOTE — Progress Notes (Signed)
Physical Therapy Treatment Patient Details Name: Andrew [name] MRN: [id number] DOB: Nov 17, 1980 Today's Date: 11/14/2020    History of Present Illness 39y/o male admitted 3/2 with two weeks of chest pain.  Pt was emergently intubated 3/2- 3/4. EEG suggested moderate, diffuse encephalopathy without seizure activity. Found to be in rhabdo with AKI. RUE pain--MRI shoulder:Edema in musculature about the shoulder. reintubated 3/6 due to hypoactive delirium for airway protection; 3/7 lumbar puncture. Extubation 3/14. Re-intubated 3/17.  Trach 3/18.  PMhx: PTSD, anxiety.    PT Comments    Pt admitted with above diagnosis. Pt was able to ambulate with +2 mod assist with chair follow by a 3rd person on 30% trach collar with VSS.  Pt reports weakness and does demonstrate decr postural stability at times however is showing some progress today with mobility. Decided to place pt in chair position in bed on return due to pts impulsivity instead of getting pt in chair.  Pt very happy to walk today and it seemed to lift his spirits.  Will continue to follow acutely.   Pt currently with functional limitations due to the deficits listed below (see PT Problem List). Pt will benefit from skilled PT to increase their independence and safety with mobility to allow discharge to the venue listed below.     Follow Up Recommendations  CIR     Equipment Recommendations  Other (comment) (TBD)    Recommendations for Other Services OT consult     Precautions / Restrictions Precautions Precautions: Fall Restrictions Weight Bearing Restrictions: No    Mobility  Bed Mobility Overal bed mobility: Needs Assistance Bed Mobility: Supine to Sit Rolling: Min guard Sidelying to sit: Min guard Supine to sit: Min guard     General bed mobility comments: Pt needed constant cues for impulsivity as he kept trying to get OOB before PT was ready for him to come to EOB.    Transfers Overall transfer level: Needs  assistance Equipment used: Rolling walker (2 wheeled) Transfers: Sit to/from [employer] Sit to Stand: Mod assist;+2 physical assistance;From elevated surface;Min assist         General transfer comment: Pt with needing mod assist for sit to stand for stability. Noted BM therefore cleaned pt and he needed to sit and rest.  Pt moves impulsively with transitions and needs cues constantly.  Once up, min to modA +2 for stability and for equipment/line management  Ambulation/Gait Ambulation/Gait assistance: Min assist;+2 physical assistance;Mod assist (3rd person for chair follow) Gait Distance (Feet): 60 Feet (10 feet then 50 feet) Assistive device: Rolling walker (2 wheeled) Gait Pattern/deviations: Step-through pattern;Decreased stride length;Wide base of support;Decreased step length - right;Decreased step length - left;Trunk flexed;Drifts right/left   Gait velocity interpretation: <1.8 ft/sec, indicate of risk for recurrent falls General Gait Details: Pt walked to door and needed to rest (chair follow).  Sat down to rest and moved pt to hallway and faced back to room so that he could walk back to bed.  Pt with very wide BOS and unequal step length with pt unsteady at times. Poorly coordinated gait pattern and pt needed assist to steer RW as well as cues for coordination of LEs and postural stability.   Stairs             Wheelchair Mobility    Modified Rankin (Stroke Patients Only)       Balance Overall balance assessment: Needs assistance Sitting-balance support: Feet supported;No upper extremity supported Sitting balance-Leahy Scale: Fair  Standing balance support: Bilateral upper extremity supported Standing balance-Leahy Scale: Poor Standing balance comment: min to ModA +2 for standing balance and stability                            Cognition Arousal/Alertness: Awake/alert Behavior During Therapy: Flat affect Overall Cognitive  Status: Impaired/Different from baseline Area of Impairment: Orientation;Following commands;Safety/judgement;Awareness;Problem solving                 Orientation Level: Disoriented to;Situation;Time Current Attention Level: Sustained   Following Commands: Follows one step commands with increased time Safety/Judgement: Decreased awareness of safety;Decreased awareness of deficits Awareness: Intellectual Problem Solving: Slow processing;Requires verbal cues;Requires tactile cues General Comments: Pt following 1 step commands      Exercises      General Comments General comments (skin integrity, edema, etc.): Pt was on 28% trach collar on arrival and pt had pulled the trach collar off. Notified nursing and replaced the collar for pt. His sats were still above 90% with it removed.  During treatment, pt was placedon 30% trach collar to walk with HR 138-142 bpm, sats 97% and above.  Pt placed back on trach collar at 28% with sats 100% when back to room.  Nurse got wrist restraints for pt and PT applied prior to departure from room.      Pertinent Vitals/Pain Pain Assessment: No/denies pain    Home Living                      Prior Function            PT Goals (current goals can now be found in the care plan section) Acute Rehab PT Goals Patient Stated Goal: to have some water Progress towards PT goals: Progressing toward goals    Frequency    Min 3X/week      PT Plan Current plan remains appropriate    Co-evaluation              AM-PAC PT "6 Clicks" Mobility   Outcome Measure  Help needed turning from your back to your side while in a flat bed without using bedrails?: A Little Help needed moving from lying on your back to sitting on the side of a flat bed without using bedrails?: A Little Help needed moving to and from a bed to a chair (including a wheelchair)?: A Lot Help needed standing up from a chair using your arms (e.g., wheelchair or bedside  chair)?: A Lot Help needed to walk in hospital room?: A Lot Help needed climbing 3-5 steps with a railing? : Total 6 Click Score: 13    End of Session Equipment Utilized During Treatment: Gait belt;Oxygen (28% trach collar) Activity Tolerance: Patient limited by fatigue Patient left: with call bell/phone within reach;in bed;with restraints reapplied;with bed alarm set Nurse Communication: Mobility status PT Visit Diagnosis: Other abnormalities of gait and mobility (R26.89);Muscle weakness (generalized) (M62.81);Pain Pain - Right/Left:  (bil) Pain - part of body: Leg     Time: 3976-7341 PT Time Calculation (min) (ACUTE ONLY): 45 min  Charges:  $Gait Training: 23-37 mins $Therapeutic Activity: 8-22 mins                     Derrell Milanes M,PT Acute Rehab Services [phone] (pager)   Bevelyn Buckles 11/14/2020, 1:19 PM

## 2020-11-14 NOTE — Progress Notes (Signed)
Pharmacy Antibiotic Note  Andrew [name] is a 40 y.o. male admitted on 10/26/2020 after complaining of chest pain x2 weeks. Patient found to be in rhabdo and was emergently intubated on 3/2. Patient extubated and reintubated twice throughout admission, most recently intubated 3/16. Pharmacy has been consulted for cefepime dosing. Patient has been receiving cefepime 2g q8h - today is day 6 of cefepime. WBC 10.5. Afebrile.   Plan: Continue cefepime 2g q8h for 7 days since 3/16 (stop date in place)  Height: 5\' 10"  (177.8 cm) Weight: 93.8 kg (206 lb 12.7 oz) IBW/kg (Calculated) : 73  Temp (24hrs), Avg:99.5 F (37.5 C), Min:99.3 F (37.4 C), Max:99.5 F (37.5 C)  Recent Labs  Lab 11/09/20 0237 11/10/20 0354 11/11/20 0511 11/12/20 0436 11/13/20 0238 11/14/20 0033  WBC 15.8* 11.5* 10.6* 11.9*  --  10.5  CREATININE 2.53* 2.28* 1.83* 1.75* 1.43* 1.19    Estimated Creatinine Clearance: 93 mL/min (by C-G formula based on SCr of 1.19 mg/dL).    No Known Allergies  Antimicrobials this admission: Cefepime 3/16 >> (3/22) Metronidazole 3/13 >> 3/14  CTX 3/3 >> 3/5; 3/7 >> 3/9; 3/13 >>3/15 Cefazolin 3/5>>3/7 Azithro 3/3 >> 3/4 Vanc 3/5>>3/6; 3/7 >>3/8  Dose adjustments this admission: 3/18: Cefepime 2 g q12h >> Cefepime 2 g q8h  Microbiology results: 3/17 TA: normal flora 3/17 MRSA PCR: negative  Thank you for allowing pharmacy to be a part of this patient's care.  4/17, PharmD Clinical Pharmacist  11/14/2020 12:22 PM

## 2020-11-14 NOTE — Progress Notes (Signed)
K 3.3 Electrolytes replaced per protocol 

## 2020-11-14 NOTE — Progress Notes (Signed)
Still agitated and requesting something to drink. Still in two point wrist restraints. Will not let us assess him or draw blood. Notified eLink and recommended to restart precedex and use PRNs to help him calm down. Will continue to monitor and titrate over night.  Esperanza Sheets, RN 11/14/2020 1935

## 2020-11-14 NOTE — Progress Notes (Signed)
Inpatient Rehabilitation Admissions Coordinator   Therapy recommends Rehab consult for possible CIR admit. Please place rehab order if you would like him considered for admit.  Ottie Glazier, RN, MSN Rehab Admissions Coordinator [phone] 11/14/2020 3:10 PM

## 2020-11-15 ENCOUNTER — Inpatient Hospital Stay (HOSPITAL_COMMUNITY): Payer: Medicaid Other

## 2020-11-15 LAB — GLUCOSE, CAPILLARY
Glucose-Capillary: 122 mg/dL — ABNORMAL HIGH (ref 70–99)
Glucose-Capillary: 119 mg/dL — ABNORMAL HIGH (ref 70–99)
Glucose-Capillary: 114 mg/dL — ABNORMAL HIGH (ref 70–99)
Glucose-Capillary: 107 mg/dL — ABNORMAL HIGH (ref 70–99)
Glucose-Capillary: 106 mg/dL — ABNORMAL HIGH (ref 70–99)
Glucose-Capillary: 112 mg/dL — ABNORMAL HIGH (ref 70–99)

## 2020-11-15 LAB — COMPREHENSIVE METABOLIC PANEL
ALT: 23 U/L (ref 0–44)
AST: 60 U/L — ABNORMAL HIGH (ref 15–41)
Albumin: 3.4 g/dL — ABNORMAL LOW (ref 3.5–5.0)
Alkaline Phosphatase: 67 U/L (ref 38–126)
Anion gap: 9 (ref 5–15)
BUN: 23 mg/dL — ABNORMAL HIGH (ref 6–20)
CO2: 22 mmol/L (ref 22–32)
Calcium: 9.1 mg/dL (ref 8.9–10.3)
Chloride: 116 mmol/L — ABNORMAL HIGH (ref 98–111)
Creatinine, Ser: 1.23 mg/dL (ref 0.61–1.24)
GFR, Estimated: >60 mL/min (ref >60)
Glucose, Bld: 121 mg/dL — ABNORMAL HIGH (ref 70–99)
Potassium: 4 mmol/L (ref 3.5–5.1)
Sodium: 147 mmol/L — ABNORMAL HIGH (ref 135–145)
Total Bilirubin: 0.8 mg/dL (ref 0.3–1.2)
Total Protein: 7.3 g/dL (ref 6.5–8.1)

## 2020-11-15 MED ORDER — SERTRALINE HCL 25 MG PO TABS
25.0000 mg | ORAL_TABLET | Freq: Every day | ORAL | Status: DC
  Administered 2020-11-15: 25 mg
  Filled 2020-11-15: qty 1

## 2020-11-15 MED ORDER — APIXABAN 5 MG PO TABS
10.0000 mg | ORAL_TABLET | Freq: Two times a day (BID) | ORAL | Status: AC
  Administered 2020-11-15 – 2020-11-21 (×14): 10 mg via ORAL
  Filled 2020-11-15 (×14): qty 2

## 2020-11-15 MED ORDER — CLONIDINE HCL 0.2 MG PO TABS
0.1000 mg | ORAL_TABLET | Freq: Three times a day (TID) | ORAL | Status: DC
  Administered 2020-11-15 – 2020-11-16 (×4): 0.1 mg
  Filled 2020-11-15 (×3): qty 1

## 2020-11-15 MED ORDER — APIXABAN (ELIQUIS) EDUCATION KIT FOR DVT/PE PATIENTS
PACK | Freq: Once | Status: AC
  Administered 2020-11-15: 1
  Filled 2020-11-15: qty 1

## 2020-11-15 MED ORDER — CLONAZEPAM 0.5 MG PO TBDP
1.0000 mg | ORAL_TABLET | Freq: Two times a day (BID) | ORAL | Status: DC | PRN
  Administered 2020-11-15: 1 mg
  Filled 2020-11-15: qty 2

## 2020-11-15 MED ORDER — APIXABAN 5 MG PO TABS
5.0000 mg | ORAL_TABLET | Freq: Two times a day (BID) | ORAL | Status: DC
  Administered 2020-11-22 – 2020-11-24 (×5): 5 mg via ORAL
  Filled 2020-11-15 (×5): qty 1

## 2020-11-15 MED ORDER — PRAZOSIN HCL 2 MG PO CAPS
4.0000 mg | ORAL_CAPSULE | Freq: Every day | ORAL | Status: DC
  Administered 2020-11-15: 4 mg
  Filled 2020-11-15: qty 2

## 2020-11-15 MED ORDER — CARVEDILOL 25 MG PO TABS
25.0000 mg | ORAL_TABLET | Freq: Two times a day (BID) | ORAL | Status: DC
  Administered 2020-11-15 – 2020-11-16 (×3): 25 mg
  Filled 2020-11-15 (×2): qty 1

## 2020-11-15 MED ORDER — HALOPERIDOL LACTATE 5 MG/ML IJ SOLN
5.0000 mg | INTRAMUSCULAR | Status: DC | PRN
  Administered 2020-11-15 – 2020-11-16 (×3): 5 mg via INTRAVENOUS
  Filled 2020-11-15 (×5): qty 1

## 2020-11-15 NOTE — Progress Notes (Signed)
NAME:  Andrew [name], MRN:  [id number], DOB:  1981-02-14, LOS: 19 ADMISSION DATE:  10/26/2020, CONSULTATION DATE:  10/27/2020 REFERRING MD:  ER CHIEF COMPLAINT: Respiratory Failure  Brief History   Mr. Trefz is a 39 y/o male admitted on 3/2 with chest pain & acute hyperactive delirium requiring mechanical ventilation. Subsequently found to be in rhabdo with AKI requiring HD. Concern Wellbutrin, Seroquel contributing. UDS negative x2, CSF negative, MRI negative. Extubated on 3/14 and re-intubated for the third time 3/16. Tracheostomy on 3/18.   Past Medical History  PTSD Depression GAD  Significant Hospital Events   3/04 extubated, AKI worse, found to be in rhabdo 3/05 Continues to have diffuse muscle pain, renal consult 3/06 increased body pain particularly right shoulder, concern for septic arthritis, MRI neg 3/13 Abd distention, cuff leak, bloody drainage around ETT  3/14 Weaning on PSV, remains on high dose sedation, extubated, delirium overnight on dex 3/15 Episode of desaturation after eating, suspected silent aspiration. Vasovagal event with ice water 3/16 Re-intubated, remains on esmolol gtt / resistant HTN 3/18 Tracheostomy 3/21 Transferred to [name]  Consults:  Renal Psych Neuro  Procedures:  ETT 3/3  >> 3/4 ETT 3/6 >> 3/14 HD catheter 3/9 >> 3/15 ETT 3/16 >> Trach 3/18 >>  Significant Diagnostic Tests:   Head CT 3/3 >> sphenoid sinusitis  Echo 3/3 >> nml LVEF, no WMA  MRI right shoulder 3/6 >> no evidence of septic joint, no effusion, severe edema and muscles around shoulder joint consistent with rhabdo  Head CT 3/6 >> neg  CSF cell count 3/7 >> WBC 2, RBC 2 (hypocellular)  MRI brain 3/7 >> normal, paranasal sinus mucosal thickening, small mastoid effusions  BLLE Ultrasound 3/14  >> BL DVT gastrocnemius  Limited ECHO 3/15 >> EF 60, no evidence of RV strain  Chest CT 3/17 >> Severe multilobar bilateral bronchopneumonia, trace R pleural  effusion   Micro Data:  COVID 3/3 >> negative Influenza 3/3 >> negative Resp 3/3 >> nml flora Blood 3/3 >> negative Blood 3/6 >> negative CSF 3/7 >> negative  Fecal lactoferrin 3/11 >> negative UA 3/14 >> +Ketones, Neg MRSA swab 3/17 >> Trach asp 3/17 >> pending  Antimicrobials:  Ceftriaxone 3/3 >> 3/6 Azithromycin 3/3 x 1 Cefazolin 3/6 >> 3/7 Ceftriaxone 3/7 >> 3/9 Vanc 3/6  X 1 dose Vanc 3/7 >> 3/8 Ceftriaxone 3/13 >> 3/15 Cefepime 3/16 >>  Interim history/subjective:   3/21: Patient became agitated and attempted to kick staff, refused to be touch. Precedex was restarted at the recommendation of eLink. 3/22: No acute events overnight Psych eval today  Objective   Blood pressure 106/66, pulse (!) 112, temperature 98.7 F (37.1 C), temperature source Oral, resp. rate (!) 38, height 5\' 10"  (1.778 m), weight 92.2 kg, SpO2 100 %.    FiO2 (%):  [21 %] 21 %   Intake/Output Summary (Last 24 hours) at 11/15/2020 0804 Last data filed at 11/15/2020 11/17/2020 Gross per 24 hour  Intake 5230.53 ml  Output 2875 ml  Net 2355.53 ml   Filed Weights   11/13/20 0400 11/14/20 0400 11/15/20 0400  Weight: 98.9 kg 93.8 kg 92.2 kg    Examination: General: Ill-appearing man laying comfortably in bed. NAD HEENT: NCAT. MMM. Trach in place  Lungs: Diminished breath sounds. No wheezes or rales. No increased WOB Cardiovascular: Tachycardic. Regular rhythm. No m/g/r. 1+ BLE edema.  Abdomen: soft, NT. Mild distention. Hypoactive BS.  Extremities: Well-perfused. Normal ROM Skin: Warm and dry. No obvious rashes or  lesions Neuro: Awake and Alert. Moves all extremities. Some visual hallucination.  Psych: Anxious mood  Resolved Hospital Problem list   AGMA  Assessment & Plan:    Acute Hypoxemic Respiratory Failure Requiring Mechanical Ventilation  (Failed extubation x 2, re-intubated on 3/16) --s/p trach --On trach collar --Off sedation and pressors.  --PT recommending CIR  Acute  Metabolic Encephalopathy with Hypoactive Delirium  Hx of psychiatric disease (PTSD, GAD, MDD) Patient with acute psychosis on admission. On multiple psychoactive medications including Seroquel and Wellbutrin. UDS and head CT negative on admit, denies history of drug abuse but sister reports suspected use. Became agitated last night requiring precedex. Discontinued precedex and started on haldol this morning P: --Pending psych eval today --Start prn haldol for agitation --Continue low-dose Seroquel and Wellbutrin --Continue PRN Klonopin for anxiety --Delirium precautions  Multilobar PNA, Aspiration pneumonitis Wedge Shaped Opacity on CXR 3/17 Leukocystosis, resolved Continues to be afebrile. O2 sats of 100 on trach collar with FIO2 of 21%.  P: --Completing 7 days of Cefepime today --MRSA swab negative  Bilateral Gastrocnemius DVTs Suspected PE Provoked clot so will likely only need 3 months of anticoagulation. Closely monitor for any worsening respiratory distress. Denies any calf pain or chest pain P: --Continue Lovenox for now --Consider switching back to Eliquis before discharge --Daily vitals  Hypertension BP improved overnight after increasing coreg to 37.5 mg  BID from 25 mg BID.  P: --Continue Coreg, norvasc, hydralazine, clonidine --Daily vitals  AKI, rhabdo, improved Hypernatremia Hyperphosphatemia Electrolyte abnormalities likely multifactorial in the setting of recent rhabdomyolysis and AKI. Na down to 147 with K+ of 4.0. No electrolyte abnormalities.  P: --Continue free water at 200 q2hr until sodium wnl.  --Trend BMP/UOP --Avoid nephrotoxic agents --Keep MAPS above 65 for better renal perfusion. --strict I/Os, monitor electrolytes  Abd distention Improving. No signs of bowel obstruction on recent KUB. Continues to deny abd pain. Pending MBS to evaluate ability to take orals.  P: --Continue tube feeds via cortrak --F/u MBS --Discontinue reglan --Continue  bowel regimen  Anemia 2/2 critical illness Hgb of 9.6 yesterday. Check CBC in the morning.  --Continue vit B12 supplementation --Transfuse w/ Hgb goal of >7  Best practice:  Diet: NPO, Tube feeds Pain/Anxiety/Delirium protocol (if indicated): n/a VAP protocol (if indicated): Yes DVT prophylaxis: enoxaparin GI prophylaxis: protonix Glucose control: n/a Mobility: OOB Disposition: Transfer to [facility] today Family: Father, Joe, updated via phone 3/17 on plan of care.   Labs   CBC: Recent Labs  Lab 11/09/20 0237 11/09/20 1653 11/10/20 0354 11/11/20 0511 11/12/20 0436 11/14/20 0033  WBC 15.8*  --  11.5* 10.6* 11.9* 10.5  HGB 8.9* 9.9* 8.9* 8.5* 8.3* 9.6*  HCT 27.9* 29.0* 27.9* 27.6* 26.1* 29.9*  MCV 97.9  --  97.2 100.0 99.2 96.5  PLT 492*  --  504* 503* 483* 561*    Basic Metabolic Panel: Recent Labs  Lab 11/12/20 0436 11/13/20 0238 11/14/20 0033 11/14/20 0918 11/14/20 1906 11/15/20 0157  NA 153* 153* 150*  --  146* 147*  K 3.9 2.6* 3.3*  --  3.9 4.0  CL 118* 116* 118*  --  117* 116*  CO2 26 25 24   --  20* 22  GLUCOSE 118* 120* 131*  --  116* 121*  BUN 40* 27* 20  --  22* 23*  CREATININE 1.75* 1.43* 1.19  --  1.18 1.23  CALCIUM 8.6* 8.9 8.6*  --  8.7* 9.1  MG  --   --   --  1.9  --   --    GFR: Estimated Creatinine Clearance: 89.3 mL/min (by C-G formula based on SCr of 1.23 mg/dL). Recent Labs  Lab 11/09/20 0237 11/10/20 0354 11/11/20 0511 11/12/20 0436 11/14/20 0033  PROCALCITON 0.71 0.49 0.27  --   --   WBC 15.8* 11.5* 10.6* 11.9* 10.5    Liver Function Tests: Recent Labs  Lab 11/12/20 0436 11/13/20 0238 11/14/20 0033 11/15/20 0157  AST 27 30 32 60*  ALT 18 22 19 23   ALKPHOS 60 69 62 67  BILITOT 0.7 0.5 0.5 0.8  PROT 6.0* 7.0 6.8 7.3  ALBUMIN 2.8* 3.3* 3.2* 3.4*   No results for input(s): LIPASE, AMYLASE in the last 168 hours. No results for input(s): AMMONIA in the last 168 hours.  ABG    Component Value Date/Time   PHART 7.353  11/09/2020 1653   PCO2ART 45.3 11/09/2020 1653   PO2ART 476 (H) 11/09/2020 1653   HCO3 24.9 11/09/2020 1653   TCO2 26 11/09/2020 1653   ACIDBASEDEF 1.0 11/09/2020 1653   O2SAT 100.0 11/09/2020 1653     Coagulation Profile: No results for input(s): INR, PROTIME in the last 168 hours.  Cardiac Enzymes: Recent Labs  Lab 11/09/20 0915 11/10/20 0354 11/14/20 0033  CKTOTAL 338 211 560*    HbA1C: No results found for: HGBA1C  CBG: Recent Labs  Lab 11/14/20 0727 11/14/20 1207 11/14/20 1942 11/14/20 2331 11/15/20 0324  GLUCAP 127* 116* 107* 104* 122*    Critical care time: 25

## 2020-11-15 NOTE — Progress Notes (Signed)
OT Cancellation Note  Patient Details Name: Andrew [name] MRN: [id number] DOB: 03/07/1981   Cancelled Treatment:    Reason Eval/Treat Not Completed: Patient at procedure or test/ unavailable (Pt at [facility] at time of OT arrival. OT to continue to follow for OT intervention. Per RN, pt has stood up and taken out IV from confusion and required medical care after MBS.)  OT following.  Flora Lipps, OTR/L Acute Rehabilitation Services Pager: [phone] Office: [phone]   Flora Lipps 11/15/2020, 5:21 PM

## 2020-11-15 NOTE — Consult Note (Signed)
[facility] Face-to-Face Psychiatry Consult   Reason for Consult: Psychiatric medication adjustments  Referring Physician: Linwood Dibbles, MD/ Jacky Kindle, MD  Patient Identification: Andrew [name] MRN:  [id number] Principal Diagnosis: Acute on chronic respiratory failure with hypoxia [facility]) Diagnosis:  Principal Problem:   Acute on chronic respiratory failure with hypoxia ([place]) Active Problems:   PTSD (post-traumatic stress disorder)   Severe episode of recurrent major depressive disorder, with psychotic features ([place])   Generalized anxiety disorder   AKI (acute kidney injury) ([place])   Encounter for central line placement   Abdominal distention   Tachycardia   Primary hypertension   Ileus ([place])   Acute respiratory failure with hypoxemia ([place])   Total Time spent with patient: 20 minutes  HPI:   Andrew [name] is a 40 y.o. male admitted on 10/26/2020 with chest pain and acute hyperactive delirium requiring mechanical ventilation.  Subsequently found to be in rhabdomyolysis with AKI requiring hemodialysis.  UDS negative*2, MRI negative.  Extubated on 3/14 and reintubated for the third time 3/16.  Tracheostomy on 3/18.  Seen by psychiatry twice during this hospitalization 10/28/20 and 11/05/2020 respectively.  Last evaluation it was recommended to consider checking CK level daily and not start Seroquel or any other antipsychotic until CK total level is below 300 as considering rhabdomyolysis may have been induced by antipsychotic.  On 10/27/2020 CK total was 141, next day on 3/4 increased to 32, 777 normalizing on 11/08/2020.  Psychiatry is reconsulted for medication management. Today patient denies suicidal or homicidal ideations.  It is difficult to do a complete psychiatric evaluation due to ongoing medical conditions but patient able to nod head.  He admits to auditory and visual hallucinations but denies any commands to harm himself or anybody around him.  Past Psychiatric History: PTSD,  generalized anxiety disorder, major depressive disorder, hallucination either due to severe PTSD or MDD with psychosis  Risk to Self:  No Risk to Others:  No Prior Inpatient Therapy:  No Prior Outpatient Therapy:  Yes  Past Medical History: History reviewed. No pertinent past medical history. History reviewed. No pertinent surgical history. Family History: History reviewed. No pertinent family history. Family Psychiatric  History: Patient's father has PTSD Social History: Patient lives with girlfriend and 48-year-old daughter from other girlfriend.    Allergies:  No Known Allergies  Labs:  CBC Latest Ref Rng & Units 11/14/2020 11/12/2020 11/11/2020  WBC 4.0 - 10.5 K/uL 10.5 11.9(H) 10.6(H)  Hemoglobin 13.0 - 17.0 g/dL 9.6(L) 8.3(L) 8.5(L)  Hematocrit 39.0 - 52.0 % 29.9(L) 26.1(L) 27.6(L)  Platelets 150 - 400 K/uL 561(H) 483(H) 503(H)   BMP Latest Ref Rng & Units 11/15/2020 11/14/2020 11/14/2020  Glucose 70 - 99 mg/dL 121(H) 116(H) 131(H)  BUN 6 - 20 mg/dL 23(H) 22(H) 20  Creatinine 0.61 - 1.24 mg/dL 1.23 1.18 1.19  Sodium 135 - 145 mmol/L 147(H) 146(H) 150(H)  Potassium 3.5 - 5.1 mmol/L 4.0 3.9 3.3(L)  Chloride 98 - 111 mmol/L 116(H) 117(H) 118(H)  CO2 22 - 32 mmol/L 22 20(L) 24  Calcium 8.9 - 10.3 mg/dL 9.1 8.7(L) 8.6(L)    Current Facility-Administered Medications  Medication Dose Route Frequency Provider Last Rate Last Admin  . 0.9 %  sodium chloride infusion   Intravenous PRN Noemi Chapel P, DO   Paused at 11/08/20 1130  . acetaminophen (TYLENOL) tablet 650 mg  650 mg Per Tube Q6H PRN Estill Cotta, NP   650 mg at 11/14/20 1359  . amLODipine (NORVASC) tablet 10 mg  10 mg Per Tube Daily Blenda Nicely, RPH   10 mg at 11/15/20 5974  . apixaban (ELIQUIS) tablet 10 mg  10 mg Oral BID Jacky Kindle, MD   10 mg at 11/15/20 1124   Followed by  . [START ON 11/22/2020] apixaban (ELIQUIS) tablet 5 mg  5 mg Oral BID Jacky Kindle, MD      . carvedilol (COREG) tablet 25 mg  25 mg Per  Tube BID WC Jacky Kindle, MD   25 mg at 11/15/20 0859  . ceFEPIme (MAXIPIME) 2 g in sodium chloride 0.9 % 100 mL IVPB  2 g Intravenous Q8H Marshall, Janett Billow, DO 200 mL/hr at 11/15/20 1354 2 g at 11/15/20 1354  . chlorhexidine gluconate (MEDLINE KIT) (PERIDEX) 0.12 % solution 15 mL  15 mL Mouth Rinse BID Estill Cotta, NP   15 mL at 11/15/20 0800  . Chlorhexidine Gluconate Cloth 2 % PADS 6 each  6 each Topical Q0600 Chesley Mires, MD   6 each at 11/15/20 0551  . clonazePAM (KLONOPIN) disintegrating tablet 1 mg  1 mg Per Tube BID PRN Jacky Kindle, MD      . cloNIDine (CATAPRES) tablet 0.1 mg  0.1 mg Per Tube TID Jacky Kindle, MD   0.1 mg at 11/15/20 0900  . docusate (COLACE) 50 MG/5ML liquid 100 mg  100 mg Per Tube BID Noemi Chapel P, DO   100 mg at 11/15/20 0900  . feeding supplement (OSMOLITE 1.5 CAL) liquid 1,000 mL  1,000 mL Per Tube Continuous Noemi Chapel P, DO 55 mL/hr at 11/15/20 0800 1,000 mL at 11/15/20 0800  . feeding supplement (PROSource TF) liquid 45 mL  45 mL Per Tube QID Noemi Chapel P, DO   45 mL at 11/15/20 0900  . free water 200 mL  200 mL Per Tube Q2H Marshall, Jessica, DO   200 mL at 11/15/20 1400  . haloperidol lactate (HALDOL) injection 5 mg  5 mg Intravenous Q4H PRN Jacky Kindle, MD   5 mg at 11/15/20 1141  . hydrALAZINE (APRESOLINE) injection 10-20 mg  10-20 mg Intravenous Q4H PRN Stretch, Marily Lente, MD   20 mg at 11/13/20 0434  . hydrALAZINE (APRESOLINE) tablet 50 mg  50 mg Per Tube Q6H Estill Cotta, NP   50 mg at 11/15/20 1124  . HYDROmorphone (DILAUDID) injection 0.5-1 mg  0.5-1 mg Intravenous Q2H PRN Lacinda Axon, MD   0.5 mg at 11/15/20 1241  . MEDLINE mouth rinse  15 mL Mouth Rinse 10 times per day Estill Cotta, NP   15 mL at 11/15/20 1200  . ondansetron (ZOFRAN) injection 4 mg  4 mg Intravenous Q6H PRN Deland Pretty, MD   4 mg at 10/30/20 1237  . pantoprazole sodium (PROTONIX) 40 mg/20 mL oral suspension 40 mg  40 mg Per Tube Daily Estill Cotta, NP   40 mg at 11/15/20 0902  . polyethylene glycol (MIRALAX / GLYCOLAX) packet 17 g  17 g Per Tube Daily Noemi Chapel P, DO   17 g at 11/10/20 1009  . polyethylene glycol (MIRALAX / GLYCOLAX) packet 17 g  17 g Per Tube Daily PRN Estill Cotta, NP      . prazosin (MINIPRESS) capsule 4 mg  4 mg Per Tube QHS Dagar, Meredith Staggers, MD      . senna (SENOKOT) tablet 8.6 mg  1 tablet Per Tube BID Estill Cotta, NP   8.6 mg at 11/14/20 0935  . sertraline (ZOLOFT) 20  MG/ML concentrated solution 25 mg  25 mg Per Tube QHS Dagar, Meredith Staggers, MD      . sodium chloride flush (NS) 0.9 % injection 10-40 mL  10-40 mL Intracatheter Q12H Chesley Mires, MD   10 mL at 11/14/20 2115  . sodium chloride flush (NS) 0.9 % injection 10-40 mL  10-40 mL Intracatheter PRN Chesley Mires, MD      . sodium chloride flush (NS) 0.9 % injection 10-40 mL  10-40 mL Intracatheter Q12H Audria Nine, DO   10 mL at 11/15/20 0910  . sodium chloride flush (NS) 0.9 % injection 10-40 mL  10-40 mL Intracatheter PRN Audria Nine, DO      . vitamin B-12 (CYANOCOBALAMIN) tablet 1,000 mcg  1,000 mcg Per Tube Daily Estill Cotta, NP   1,000 mcg at 11/15/20 0906    Musculoskeletal: Strength & Muscle Tone: within normal limits Gait & Station: Unable to evaluate due to patient's ongoing medical condition Patient leans: N/A  Psychiatric Specialty Exam: Physical Exam Vitals and nursing note reviewed.  HENT:     Head: Normocephalic and atraumatic.  Cardiovascular:     Rate and Rhythm: Tachycardia present.  Abdominal:     Palpations: Abdomen is soft.  Musculoskeletal:     Cervical back: Normal range of motion.  Skin:    General: Skin is warm.  Neurological:     Mental Status: He is oriented to person, place, and time.     Review of Systems  Blood pressure (!) 145/99, pulse (!) 112, temperature 98.7 F (37.1 C), temperature source Oral, resp. rate (!) 36, height 5' 10"  (1.778 m), weight 92.2 kg, SpO2 97 %.Body mass  index is 29.17 kg/m.  General Appearance: Casual  Eye Contact:  Good  Speech:  NA  Volume:  N/A  Mood:  Anxious  Affect:  Appropriate  Thought Process:  NA  Orientation:  Full (Time, Place, and Person)  Thought Content:  Hallucinations: Auditory Visual  Suicidal Thoughts:  No  Homicidal Thoughts:  No  Memory:  NA  Judgement:  NA  Insight:  NA  Psychomotor Activity:  Normal  Concentration:  Concentration: Fair and Attention Span: Fair  Recall:  NA  Fund of Knowledge:  NA  Language:  NA  Akathisia:  No  Handed:  Right  AIMS (if indicated):     Assets:  Desire for Improvement Resilience Social Support  ADL's:  Impaired  Cognition:  WNL  Sleep:      Assessment: 41 year old male presented with chest pain and admitted for acute respiratory failure, rhabdomyolysis and acute hyperactive delirium. -Patient is unable to participate in psychiatric evaluation due to tracheostomy and ongoing medical conditions.  Today he denies any suicidal or homicidal ideations by nodding his head.  He admits to auditory and visual hallucinations but does not consider them distressing but admits them being making him anxious.  He remembers the provider from earlier interview. -Prior to hospitalization he was on prazosin 4 mg nightly, bupropion 150 mg daily, quetiapine 400 mg nightly, lorazepam 1 mg nightly.  His ongoing hallucination can be because of his PTSD or it can be MDD with psychosis.  Unable to clarify at this point due to extremely limited history and patient's inability to participate in psychiatric interview. -Patient has been restarted on Seroquel and Wellbutrin.  His CK level is elevated to 560 again on 11/14/2020. -Plan is to discontinue Wellbutrin and start Zoloft to manage his depression and anxiety.  Also starting prazosin 4 mg nightly to address  his PTSD symptoms as it might be causing psychosis/nightmares and also making him extremely anxious.  We will hold Seroquel until his CK level are  back to normal.  Treatment Plan: --Start Zoloft 25 mg for anxiety and depression --Hold Seroquel until CK levels back to normal --Discontinue Wellbutrin --Start prazosin 4 mg nightly to address his PTSD as it might be causing nightmares/psychosis. --Daily contact with patient to assess and evaluate symptoms and progress in treatment --Psychiatry will continue to follow   Honor Junes, MD 11/15/2020 2:15 PM  PGY-1, Resident

## 2020-11-15 NOTE — Progress Notes (Signed)
Inpatient Rehabilitation Admissions Coordinator  Inpatient rehab consult received. I met with patient at bedside for assessment. He is frustrated, but not agitated. With his permission, I contacted his sister, Laqueta Linden. We discussed goals, expectations as well as rehab venue options. She will discuss with her Dad. Patient is a disabled veteran since leaving the [employer] after 6 years. Sister will verify if he has the 24/7 caregiver support after any rehab venue. He may need SNF through the [place] until he recovers to a level that he would only require intermittent supervision. I will follow to assess his needs.  Danne Baxter, RN, MSN Rehab Admissions Coordinator [phone] 11/15/2020 1:43 PM

## 2020-11-15 NOTE — Progress Notes (Signed)
Modified Barium Swallow Progress Note  Patient Details  Name: Andrew [name] MRN: [id number] Date of Birth: May 03, 1981  Today's Date: 11/15/2020  Modified Barium Swallow completed.  Full report located under Chart Review in the Imaging Section.  Brief recommendations include the following:  Clinical Impression  Pt presented with normal oropharyngeal function across all consistencies. There was adequate mastication of solids, timely swallow trigger, and reliable laryngeal vestibule closure. There was NO ASPIRATION. Pharyngeal stripping was normal and there was no residue post-swallow.  Pt used PMV for most of study - it was removed and he was tested with thin liquids for the last few trials to determine if its absence altered the physiology. He demonstrated one incident of penetration with the valve off.  Recommend beginning a dysphagia 3 diet with thin liquids. Give meds whole in puree. Use valve when eating/drinking to restore cough and benefits for airway protection. D/W pt and RN.  Pt was very happy with results and consumed 8 oz of orange juice after he returned to his room.  SLP will follow briefly to ensure safety/toleration.   Swallow Evaluation Recommendations       SLP Diet Recommendations: Dysphagia 3 (Mech soft) solids;Thin liquid   Liquid Administration via: Cup;Straw   Medication Administration: Whole meds with puree   Supervision: Patient able to self feed   Compensations: Minimize environmental distractions       Oral Care Recommendations: Oral care BID   Other Recommendations: Place PMSV during PO intake   Amanda L. Samson Frederic, MA CCC/SLP Acute Rehabilitation Services Office number [phone] Pager [phone]  Blenda Mounts Laurice 11/15/2020,3:10 PM

## 2020-11-15 NOTE — Progress Notes (Signed)
  Speech Language Pathology Treatment: [place] Speaking valve  Patient Details Name: Andrew [name] MRN: [id number] DOB: 09-30-1980 Today's Date: 11/15/2020 Time: 1015-1027 SLP Time Calculation (min) (ACUTE ONLY): 12 min  Assessment / Plan / Recommendation Clinical Impression  Pt more alert and participatory today than during yesterday's session.  Cuff deflated at baseline. VS remained consistent pre- and post-valve placement: Sp02 100%, HR 100%, RR 25-37. Pt achieved improved volume today; voice was quite hoarse.  He counted from 1-10, stated DOW and MOY with cues needed to slow down, pace voicing with exhalation.  Pt sleepy but interactive.  No evidence of air trapping upon removal of PMV at regular intervals.   Recommend pt use valve when fully supervised by staff; it should be removed when staff leaves the room.  Clinically, pt is ready for an instrumental swallow study - will D/W RN the likelihood of MBS happening today with regard to mental status and participation.  If LOA doesn't allow it today, will reassess tomorrow.    HPI HPI: Pt is a 40 y/o male with PMH PTSD, and anxiety. He was admitted 3/2 with two weeks of chest pain.  Pt was emergently intubated 3/2-3/4. EEG suggested moderate, diffuse encephalopathy without seizure activity. Found to be in rhabdo with AKI. Pt reintubated 3/6-3/14 for airway protection due to hypoactive delirium. BSE 3/15: no signs of aspiration or dysphagia. He both passed a 3 oz water swallow with RN and SLP. A clear liquid diet had been started by MD at that time and SLP recommended advancement per critical care. Episode of desaturation after eating on 3/15, MD suspected silent aspiration. Vasovagal event with ice water. Pt re-intubated 3/17-trach 3/18.  CXR 3/18: Patchy interstitial and airspace opacities persist compatible with multi lobar pneumonia.      SLP Plan  MBS       Recommendations  Diet recommendations: NPO      Patient may use  Passy-Muir Speech Valve: During all therapies with supervision PMSV Supervision: Full         Oral Care Recommendations: Oral care QID Follow up Recommendations: Inpatient Rehab SLP Visit Diagnosis: Aphonia (R49.1) Plan: MBS       GO               Amanda L. Samson Frederic, MA CCC/SLP Acute Rehabilitation Services Office number [phone] Pager [phone]  Carolan Shiver 11/15/2020, 10:32 AM

## 2020-11-15 NOTE — Progress Notes (Signed)
Patient found standing at bedside, cortrak and right PIV out. Pt did not fall. Helped to bed. VSS.

## 2020-11-15 NOTE — Progress Notes (Signed)
Dr. Merrily Pew was notified of increasing agitation despite clonazepam and haldol. Awaiting orders.

## 2020-11-16 LAB — GLUCOSE, CAPILLARY
Glucose-Capillary: 102 mg/dL — ABNORMAL HIGH (ref 70–99)
Glucose-Capillary: 107 mg/dL — ABNORMAL HIGH (ref 70–99)
Glucose-Capillary: 108 mg/dL — ABNORMAL HIGH (ref 70–99)
Glucose-Capillary: 112 mg/dL — ABNORMAL HIGH (ref 70–99)
Glucose-Capillary: 124 mg/dL — ABNORMAL HIGH (ref 70–99)
Glucose-Capillary: 98 mg/dL (ref 70–99)

## 2020-11-16 LAB — CBC
HCT: 27.5 % — ABNORMAL LOW (ref 39.0–52.0)
Hemoglobin: 8.8 g/dL — ABNORMAL LOW (ref 13.0–17.0)
MCH: 31 pg (ref 26.0–34.0)
MCHC: 32 g/dL (ref 30.0–36.0)
MCV: 96.8 fL (ref 80.0–100.0)
Platelets: 506 10*3/uL — ABNORMAL HIGH (ref 150–400)
RBC: 2.84 MIL/uL — ABNORMAL LOW (ref 4.22–5.81)
RDW: 12.9 % (ref 11.5–15.5)
WBC: 8.6 10*3/uL (ref 4.0–10.5)
nRBC: 0 % (ref 0.0–0.2)

## 2020-11-16 LAB — COMPREHENSIVE METABOLIC PANEL
ALT: 23 U/L (ref 0–44)
AST: 58 U/L — ABNORMAL HIGH (ref 15–41)
Albumin: 3.2 g/dL — ABNORMAL LOW (ref 3.5–5.0)
Alkaline Phosphatase: 57 U/L (ref 38–126)
Anion gap: 8 (ref 5–15)
BUN: 20 mg/dL (ref 6–20)
CO2: 22 mmol/L (ref 22–32)
Calcium: 8.7 mg/dL — ABNORMAL LOW (ref 8.9–10.3)
Chloride: 111 mmol/L (ref 98–111)
Creatinine, Ser: 1.22 mg/dL (ref 0.61–1.24)
GFR, Estimated: >60 mL/min (ref >60)
Glucose, Bld: 118 mg/dL — ABNORMAL HIGH (ref 70–99)
Potassium: 3.7 mmol/L (ref 3.5–5.1)
Sodium: 141 mmol/L (ref 135–145)
Total Bilirubin: 0.9 mg/dL (ref 0.3–1.2)
Total Protein: 6.9 g/dL (ref 6.5–8.1)

## 2020-11-16 MED ORDER — HYDRALAZINE HCL 50 MG PO TABS
50.0000 mg | ORAL_TABLET | Freq: Four times a day (QID) | ORAL | Status: DC
  Administered 2020-11-16 – 2020-11-19 (×12): 50 mg via ORAL
  Filled 2020-11-16 (×14): qty 1

## 2020-11-16 MED ORDER — ACETAMINOPHEN 325 MG PO TABS
650.0000 mg | ORAL_TABLET | Freq: Four times a day (QID) | ORAL | Status: DC | PRN
  Administered 2020-11-16 – 2020-11-22 (×2): 650 mg via ORAL
  Filled 2020-11-16 (×2): qty 2

## 2020-11-16 MED ORDER — DOCUSATE SODIUM 100 MG PO CAPS
100.0000 mg | ORAL_CAPSULE | Freq: Two times a day (BID) | ORAL | Status: DC
  Administered 2020-11-16 – 2020-11-22 (×5): 100 mg via ORAL
  Filled 2020-11-16 (×9): qty 1

## 2020-11-16 MED ORDER — "THROMBI-PAD 3""X3"" EX PADS"
1.0000 | MEDICATED_PAD | CUTANEOUS | Status: AC
  Administered 2020-11-16: 1 via TOPICAL
  Filled 2020-11-16: qty 1

## 2020-11-16 MED ORDER — ADULT MULTIVITAMIN W/MINERALS CH
1.0000 | ORAL_TABLET | Freq: Every day | ORAL | Status: DC
  Administered 2020-11-16 – 2020-11-24 (×9): 1 via ORAL
  Filled 2020-11-16 (×9): qty 1

## 2020-11-16 MED ORDER — SENNA 8.6 MG PO TABS
1.0000 | ORAL_TABLET | Freq: Two times a day (BID) | ORAL | Status: DC
  Administered 2020-11-16 – 2020-11-22 (×4): 8.6 mg via ORAL
  Filled 2020-11-16 (×9): qty 1

## 2020-11-16 MED ORDER — CLONAZEPAM 0.5 MG PO TBDP
1.0000 mg | ORAL_TABLET | Freq: Two times a day (BID) | ORAL | Status: DC | PRN
  Administered 2020-11-16 – 2020-11-23 (×6): 1 mg via ORAL
  Filled 2020-11-16 (×6): qty 2

## 2020-11-16 MED ORDER — SERTRALINE HCL 25 MG PO TABS: 25.0000 mg | ORAL_TABLET | Freq: Every day | ORAL | Status: DC

## 2020-11-16 MED ORDER — VITAMIN B-12 1000 MCG PO TABS
1000.0000 ug | ORAL_TABLET | Freq: Every day | ORAL | Status: DC
  Administered 2020-11-17 – 2020-11-24 (×8): 1000 ug via ORAL
  Filled 2020-11-16 (×8): qty 1

## 2020-11-16 MED ORDER — CLONIDINE HCL 0.1 MG PO TABS
0.1000 mg | ORAL_TABLET | Freq: Three times a day (TID) | ORAL | Status: DC
  Administered 2020-11-16 – 2020-11-19 (×9): 0.1 mg via ORAL
  Filled 2020-11-16 (×11): qty 1

## 2020-11-16 MED ORDER — MIDAZOLAM HCL 2 MG/2ML IJ SOLN
INTRAMUSCULAR | Status: AC
  Administered 2020-11-16: 2 mg
  Filled 2020-11-16: qty 2

## 2020-11-16 MED ORDER — FENTANYL CITRATE (PF) 100 MCG/2ML IJ SOLN
INTRAMUSCULAR | Status: AC
  Administered 2020-11-16: 50 ug
  Filled 2020-11-16: qty 2

## 2020-11-16 MED ORDER — ENSURE ENLIVE PO LIQD
237.0000 mL | Freq: Two times a day (BID) | ORAL | Status: DC
  Administered 2020-11-16 – 2020-11-23 (×12): 237 mL via ORAL
  Filled 2020-11-16: qty 237

## 2020-11-16 MED ORDER — AMLODIPINE BESYLATE 10 MG PO TABS
10.0000 mg | ORAL_TABLET | Freq: Every day | ORAL | Status: DC
  Administered 2020-11-17 – 2020-11-19 (×3): 10 mg via ORAL
  Filled 2020-11-16 (×3): qty 1

## 2020-11-16 MED ORDER — PRAZOSIN HCL 2 MG PO CAPS
4.0000 mg | ORAL_CAPSULE | Freq: Every day | ORAL | Status: DC
  Administered 2020-11-16 – 2020-11-23 (×8): 4 mg via ORAL
  Filled 2020-11-16 (×9): qty 2

## 2020-11-16 MED ORDER — POLYETHYLENE GLYCOL 3350 17 G PO PACK
17.0000 g | PACK | Freq: Every day | ORAL | Status: DC
  Administered 2020-11-18: 17 g via ORAL
  Filled 2020-11-16 (×5): qty 1

## 2020-11-16 MED ORDER — POLYETHYLENE GLYCOL 3350 17 G PO PACK: 17.0000 g | PACK | Freq: Every day | ORAL | Status: DC | PRN

## 2020-11-16 MED ORDER — LIDOCAINE-EPINEPHRINE 1 %-1:100000 IJ SOLN
10.0000 mL | INTRAMUSCULAR | Status: AC
  Administered 2020-11-16: 10 mL via INTRADERMAL
  Filled 2020-11-16: qty 10

## 2020-11-16 MED ORDER — PANTOPRAZOLE SODIUM 40 MG PO TBEC
40.0000 mg | DELAYED_RELEASE_TABLET | Freq: Every day | ORAL | Status: DC
  Administered 2020-11-16 – 2020-11-23 (×8): 40 mg via ORAL
  Filled 2020-11-16 (×8): qty 1

## 2020-11-16 MED ORDER — CARVEDILOL 25 MG PO TABS
25.0000 mg | ORAL_TABLET | Freq: Two times a day (BID) | ORAL | Status: DC
Start: 2020-11-16 — End: 2020-11-24
  Administered 2020-11-16 – 2020-11-24 (×16): 25 mg via ORAL
  Filled 2020-11-16 (×16): qty 1

## 2020-11-16 MED ORDER — SERTRALINE HCL 50 MG PO TABS
50.0000 mg | ORAL_TABLET | Freq: Every day | ORAL | Status: DC
  Administered 2020-11-16 – 2020-11-23 (×8): 50 mg via ORAL
  Filled 2020-11-16 (×8): qty 1

## 2020-11-16 NOTE — Progress Notes (Signed)
Physical Therapy Treatment Patient Details Name: Andrew [name] MRN: [id number] DOB: 1980-08-28 Today's Date: 11/16/2020    History of Present Illness 40y/o male admitted 3/2 with two weeks of chest pain.  Pt was emergently intubated 3/2- 3/4. EEG suggested moderate, diffuse encephalopathy without seizure activity. Found to be in rhabdo with AKI. RUE pain--MRI shoulder:Edema in musculature about the shoulder. reintubated 3/6 due to hypoactive delirium for airway protection; 3/7 lumbar puncture. Extubation 3/14. Re-intubated 3/17.  Trach 3/18.  PMhx: PTSD, anxiety.    PT Comments    Patient moving better and showing less impulsivity and better awareness of lines. Ambulated 140 ft without device with min assist (2nd person for lines). Max HR during session 153 bpm with sats 93-100% on room air. Current goals met and updated.     Follow Up Recommendations  CIR     Equipment Recommendations  Other (comment) (TBD)    Recommendations for Other Services       Precautions / Restrictions Precautions Precautions: Fall    Mobility  Bed Mobility Overal bed mobility: Needs Assistance Bed Mobility: Supine to Sit     Supine to sit: Supervision     General bed mobility comments: supervision for lines; no impulsivity today    Transfers Overall transfer level: Needs assistance   Transfers: Sit to/from Stand Sit to Stand: Min assist;+2 safety/equipment         General transfer comment: min assist for stability upon standing; pt with reports of dizzienss yet denied faint feeling (not orthostatic)  Ambulation/Gait Ambulation/Gait assistance: Min assist;+2 safety/equipment Gait Distance (Feet): 140 Feet Assistive device: None Gait Pattern/deviations: Step-through pattern;Decreased stride length;Wide base of support;Drifts right/left     General Gait Details: Good slower pace related to IV and lines; pt using wide BOS without cues to improve stability   Stairs              Wheelchair Mobility    Modified Rankin (Stroke Patients Only)       Balance Overall balance assessment: Needs assistance Sitting-balance support: Feet supported;No upper extremity supported Sitting balance-Leahy Scale: Fair     Standing balance support: No upper extremity supported;During functional activity Standing balance-Leahy Scale: Fair                              Cognition Arousal/Alertness: Awake/alert Behavior During Therapy: Flat affect Overall Cognitive Status: Impaired/Different from baseline Area of Impairment: Following commands;Safety/judgement                 Orientation Level:  (NT) Current Attention Level: Selective   Following Commands: Follows one step commands consistently Safety/Judgement: Decreased awareness of safety;Decreased awareness of deficits   Problem Solving: Slow processing;Requires verbal cues;Requires tactile cues General Comments: Showed good awareness of multiple lines; not impulsive this date--patiently waiting for lines to be prepared      Exercises      General Comments General comments (skin integrity, edema, etc.): Resting HR 130 BP150/103 SpO2 98% on RA with PMV; Standing at sink ADLs HR 153 Sats 93%; seated rest with HR down to 136; ambulaing HR 142 sats 96%; BP seated 124/93      Pertinent Vitals/Pain Pain Assessment: Faces Faces Pain Scale: No hurt    Home Living                      Prior Function            PT  Goals (current goals can now be found in the care plan section) Acute Rehab PT Goals Patient Stated Goal: to have some water PT Goal Formulation: With patient Time For Goal Achievement: 11/30/20 Potential to Achieve Goals: Good Progress towards PT goals: Progressing toward goals;Goals met and updated - see care plan    Frequency    Min 3X/week      PT Plan Current plan remains appropriate    Co-evaluation PT/OT/SLP Co-Evaluation/Treatment: Yes Reason for  Co-Treatment: Necessary to address cognition/behavior during functional activity;To address functional/ADL transfers;For patient/therapist safety PT goals addressed during session: Mobility/safety with mobility;Balance        AM-PAC PT "6 Clicks" Mobility   Outcome Measure  Help needed turning from your back to your side while in a flat bed without using bedrails?: A Little Help needed moving from lying on your back to sitting on the side of a flat bed without using bedrails?: A Little Help needed moving to and from a bed to a chair (including a wheelchair)?: A Little Help needed standing up from a chair using your arms (e.g., wheelchair or bedside chair)?: A Little Help needed to walk in hospital room?: A Little Help needed climbing 3-5 steps with a railing? : A Lot 6 Click Score: 17    End of Session Equipment Utilized During Treatment: Gait belt Activity Tolerance: Patient limited by fatigue Patient left: with call bell/phone within reach;in chair;with chair alarm set Nurse Communication: Mobility status PT Visit Diagnosis: Other abnormalities of gait and mobility (R26.89);Muscle weakness (generalized) (M62.81);Pain     Time: 0041-5930 PT Time Calculation (min) (ACUTE ONLY): 27 min  Charges:  $Gait Training: 8-22 mins                      Arby Barrette, PT Pager [phone]    Rexanne Mano 11/16/2020, 9:59 AM

## 2020-11-16 NOTE — Progress Notes (Signed)
Speech Language Pathology Treatment: Dysphagia;Passy Muir Speaking valve  Patient Details Name: Andrew [name] MRN: [id number] DOB: 1980-12-02 Today's Date: 11/16/2020 Time: 1540-0867 SLP Time Calculation (min) (ACUTE ONLY): 25 min  Assessment / Plan / Recommendation Clinical Impression  Pt was seen for treatment soon after ambulating with PT. He was seated upright in recliner upon SLP's arrival and was vomiting and coughing. Pt was attempting to use oral suctioning to clear emesis from the oral cavity, but it appeared that the suction tube had become blocked and it's use was therefore ineffective. SLP replace suction, but it was no longer needed by the time this was done. Pt reported that he felt nauseated. [name], RN was advised of this and changed the pt, and provided zofran. Pt stated that he would still like to eat. He consumed thin liquids via straw without overt s/sx of aspiration. Mastication of regular textures was [facility] and oral clearance adequate. Pt exhibited delayed coughing with regular textures; however, this was also noted at baseline. PMSV was on upon SLP's entry and cuff was deflated. Pt's RN reported that the PMV was in place since 0700 and that the pt has not been in distress. He tolerated PMSV for 25 minutes during this session. Vitals range was RR 22-33, SpO2 96-97, and HR 112-116 during this period and pt denied any respiratory difficulty. Pt stated that his vocal quality is close to baseline, but that he sometimes "looses [his] breath" during speech. Vocal quality was mildly hoarse, but intelligibility was adequate. He was able to participate in conversation with the SLP but with mildly reduced breath support. Pt requested to be transferred back to bed. Pt's RN was advised of this and PMSV was left on to facilitate communication with NT who was about to assist him with this. PMSV may be used during all therapies with intermittent supervision and his current dysphagia 3 diet with thin  liquids may be continued. SLP will continue to follow pt.   HPI HPI: Pt is a 40 y/o male with PMH PTSD, and anxiety. He was admitted 3/2 with two weeks of chest pain.  Pt was emergently intubated 3/2-3/4. EEG suggested moderate, diffuse encephalopathy without seizure activity. Found to be in rhabdo with AKI. Pt reintubated 3/6-3/14 for airway protection due to hypoactive delirium. BSE 3/15: no signs of aspiration or dysphagia. He both passed a 3 oz water swallow with RN and SLP. A clear liquid diet had been started by MD at that time and SLP recommended advancement per critical care. Episode of desaturation after eating on 3/15, MD suspected silent aspiration. Vasovagal event with ice water. Pt re-intubated 3/17-trach 3/18.  CXR 3/18: Patchy interstitial and airspace opacities persist compatible with multi lobar pneumonia.      SLP Plan  Continue with current plan of care       Recommendations  Diet recommendations: Dysphagia 3 (mechanical soft);Thin liquid Liquids provided via: Cup;Straw Medication Administration: Whole meds with liquid Supervision: Patient able to self feed Compensations: Minimize environmental distractions      Patient may use Passy-Muir Speech Valve: During all therapies with supervision;During PO intake/meals PMSV Supervision: Intermittent MD: Please consider changing trach tube to : Smaller size;Cuffless         Oral Care Recommendations: Oral care BID Follow up Recommendations: Inpatient Rehab SLP Visit Diagnosis: Aphonia (R49.1);Dysphagia, oropharyngeal phase (R13.12) Plan: Continue with current plan of care       Shanika I. Vear Clock, MS, CCC-SLP Acute Rehabilitation Services Office number [phone] Pager [phone]  Scheryl Marten 11/16/2020, 11:05 AM

## 2020-11-16 NOTE — Progress Notes (Signed)
Occupational Therapy Treatment Patient Details Name: Andrew [name] MRN: [id number] DOB: Dec 22, 1980 Today's Date: 11/16/2020    History of present illness 40y/o male admitted 3/2 with two weeks of chest pain.  Pt was emergently intubated 3/2- 3/4. EEG suggested moderate, diffuse encephalopathy without seizure activity. Found to be in rhabdo with AKI. RUE pain--MRI shoulder:Edema in musculature about the shoulder. reintubated 3/6 due to hypoactive delirium for airway protection; 3/7 lumbar puncture. Extubation 3/14. Re-intubated 3/17.  Trach 3/18.  PMhx: PTSD, anxiety.   OT comments  Pt progressing. Pt following all commands with multimodal cues. Pt alert with eyes open. Pt limited by feeling dizzy in sitting and standing. Pt performing grooming at sink x3 mins and performing ADL functional mobility. Pt BP in sitting 150/103, 130 BPM - getting blood pressure meds from RN. Pt 124/93, 117 BPM  sitting at EOB. resting HR 130s; during ADL functional mobility up to 144 BPM. RN aware.Pt would benefit from continued OT skilled services for ADL, mobility, cognition.    Follow Up Recommendations  CIR;Supervision/Assistance - 24 hour    Equipment Recommendations  None recommended by OT    Recommendations for Other Services      Precautions / Restrictions Precautions Precautions: Fall Restrictions Weight Bearing Restrictions: No       Mobility Bed Mobility Overal bed mobility: Needs Assistance Bed Mobility: Supine to Sit     Supine to sit: Supervision     General bed mobility comments: SupervisionA    Transfers Overall transfer level: Needs assistance   Transfers: Sit to/from Stand Sit to Stand: Min assist;+2 safety/equipment         General transfer comment: min assist for stability upon standing; pt with reports of dizziness yet denied faint feeling (not orthostatic    Balance Overall balance assessment: Needs assistance Sitting-balance support: Feet supported;No upper  extremity supported Sitting balance-Leahy Scale: Fair     Standing balance support: No upper extremity supported;During functional activity Standing balance-Leahy Scale: Fair                             ADL either performed or assessed with clinical judgement   ADL Overall ADL's : Needs assistance/impaired Eating/Feeding: Set up;Sitting Eating/Feeding Details (indicate cue type and reason): drinking from cup Grooming: Min guard;Standing Grooming Details (indicate cue type and reason): x3 mins for washing face, brushging teeth and washing hands- no cues for sequencing             Lower Body Dressing: Supervision/safety;Sitting/lateral leans               Functional mobility during ADLs: Minimal assistance;+2 for physical assistance;+2 for safety/equipment General ADL Comments: Pt following all commands with multimodal cues. Pt alert with eyes open. Pt limited by feeling dizzy in sitting and standing. Pt BP in sitting 150/103, 130 BPM - getting blood pressure meds from RN. Pt 124/93, 117 BPM  sitting at EOB.     Vision   Vision Assessment?: No apparent visual deficits Additional Comments: continue to assess   Perception     Praxis      Cognition Arousal/Alertness: Awake/alert Behavior During Therapy: Flat affect Overall Cognitive Status: Impaired/Different from baseline Area of Impairment: Following commands;Safety/judgement                   Current Attention Level: Selective   Following Commands: Follows one step commands consistently Safety/Judgement: Decreased awareness of safety;Decreased awareness of deficits   Problem  Solving: Slow processing;Requires verbal cues;Requires tactile cues General Comments: Showed good awareness of multiple lines; not impulsive this date--patiently waiting for lines to be prepared        Exercises     Shoulder Instructions       General Comments resting HR 130s; during ADL functional mobility up to  144 BPM. RN aware.    Pertinent Vitals/ Pain       Pain Assessment: Faces Faces Pain Scale: No hurt  Home Living                                          Prior Functioning/Environment              Frequency  Min 2X/week        Progress Toward Goals  OT Goals(current goals can now be found in the care plan section)  Progress towards OT goals: Progressing toward goals  Acute Rehab OT Goals Patient Stated Goal: to have some water OT Goal Formulation: With patient Time For Goal Achievement: 11/22/20 Potential to Achieve Goals: Good ADL Goals Pt Will Perform Grooming: Independently;standing Pt Will Perform Upper Body Bathing: Independently;sitting Pt Will Perform Lower Body Bathing: Independently;sit to/from stand Pt Will Perform Upper Body Dressing: Independently;sitting Pt Will Perform Lower Body Dressing: Independently;sit to/from stand Pt Will Transfer to Toilet: Independently;ambulating;regular height toilet Pt Will Perform Toileting - Clothing Manipulation and hygiene: Independently;sit to/from stand Pt/caregiver will Perform Home Exercise Program: Increased ROM;Increased strength;Both right and left upper extremity;With minimal assist;With written HEP provided Additional ADL Goal #1: Follow one step commands 100%of time Additional ADL Goal #2: Pt will maintain eyes open entire session Additional ADL Goal #3: Pt will be Independent in and OOB for basic ADLs  Plan Discharge plan remains appropriate    Co-evaluation    PT/OT/SLP Co-Evaluation/Treatment: Yes Reason for Co-Treatment: Necessary to address cognition/behavior during functional activity;For patient/therapist safety   OT goals addressed during session: ADL's and self-care      AM-PAC OT "6 Clicks" Daily Activity     Outcome Measure   Help from another person eating meals?: A Little Help from another person taking care of personal grooming?: A Little Help from another person  toileting, which includes using toliet, bedpan, or urinal?: A Little Help from another person bathing (including washing, rinsing, drying)?: A Little Help from another person to put on and taking off regular upper body clothing?: A Little Help from another person to put on and taking off regular lower body clothing?: A Little 6 Click Score: 18    End of Session Equipment Utilized During Treatment: Gait belt  OT Visit Diagnosis: Unsteadiness on feet (R26.81);Muscle weakness (generalized) (M62.81);Other symptoms and signs involving cognitive function Pain - Right/Left: Right Pain - part of body: Arm   Activity Tolerance Patient tolerated treatment well   Patient Left in chair;with call bell/phone within reach;with chair alarm set   Nurse Communication Mobility status        Time: 1314-3888 OT Time Calculation (min): 29 min  Charges: OT General Charges $OT Visit: 1 Visit OT Treatments $Self Care/Home Management : 8-22 mins  Flora Lipps, OTR/L Acute Rehabilitation Services Pager: [phone] Office: [phone]   Marjie Chea C 11/16/2020, 1:48 PM

## 2020-11-16 NOTE — Progress Notes (Signed)
Nutrition Follow-up  DOCUMENTATION CODES:   Not applicable  INTERVENTION:    Ensure Enlive po BID, each supplement provides 350 kcal and 20 grams of protein  MVI with minerals daily  No need to replace Cortrak at this time  NUTRITION DIAGNOSIS:   Increased nutrient needs related to acute illness as evidenced by estimated needs.  Ongoing  GOAL:   Patient will meet greater than or equal to 90% of their needs  Progressing   MONITOR:   PO intake,Supplement acceptance,Labs  REASON FOR ASSESSMENT:   Consult Enteral/tube feeding initiation and management  ASSESSMENT:   40 year old male who presented to the ED on 3/2 with chest pain x 2 weeks and developed acute hyperactive delirium. No pertinent PMH. Pt intubated for airway protection.  S/P trach placement 3/18. Has been tolerating trach collar. Now on room air with PMV in place.   S/P MBS with SLP 3/22, diet advanced to dysphagia 3 with thin liquids. Patient ate 100% of dinner yesterday and 75% of breakfast today.   Patient pulled Cortrak out 3/22. Cortrak replacement was ordered yesterday prior to MBS, however, given good intake and tolerance of PO diet, will not need to replace Cortrak tube at this time. Discussed with RN.   Labs reviewed. CBG: 98-102  Medications reviewed and include vitamin B-12.  Lowest weight since admission 92.2 kg on 3/22. Current weight 93.3 kg.  Diet Order:   Diet Order            DIET DYS 3 Room service appropriate? No; Fluid consistency: Thin  Diet effective now                 EDUCATION NEEDS:   No education needs have been identified at this time  Skin:  Skin Assessment: Reviewed RN Assessment  Last BM:  3/22  Height:   Ht Readings from Last 1 Encounters:  11/14/20 5\' 10"  (1.778 m)    Weight:   Wt Readings from Last 1 Encounters:  11/16/20 93.3 kg    BMI:  Body mass index is 29.51 kg/m.  Estimated Nutritional Needs:   Kcal:  2300-2500  Protein:   120-140 gm  Fluid:  >/= 2.2 L   11/18/20, RD, LDN, CNSC Please refer to Amion for contact information.

## 2020-11-16 NOTE — Progress Notes (Signed)
Psychiatry Progress Note  11/16/2020 11:24 AM BARNES FLOREK  MRN:  [id number]   Subjective: Last night patient was agitated and received clonazepam and Haldol. Patient evaluated at bedside this morning, RN present in the room.  He denies any suicidal or homicidal ideations.  He denies any auditory or visual hallucination today and states last time he had hallucination was yesterday~evening time.  He denies any new complaints but states he is anxious about being in hospital for so long time and is eager to return back to his home.  Principal Problem: Acute on chronic respiratory failure with hypoxia (HCC) Diagnosis: Principal Problem:   Acute on chronic respiratory failure with hypoxia (HCC) Active Problems:   PTSD (post-traumatic stress disorder)   Severe episode of recurrent major depressive disorder, with psychotic features ([place])   Generalized anxiety disorder   AKI (acute kidney injury) ([place])   Encounter for central line placement   Abdominal distention   Tachycardia   Primary hypertension   Ileus ([place])   Acute respiratory failure with hypoxemia (HCC)  Total Time spent with patient: 20 minutes  Past Psychiatric History: PTSD, generalized anxiety disorder, major depressive disorder, hallucination either due to severe PTSD or MDD with psychosis.  He was receiving prazosin 4 mg nightly, bupropion 150 mg daily, quetiapine 400 mg nightly and 1 mg lorazepam nightly from [facility] for PTSD, anxiety, depression.  Past Medical History: History reviewed. No pertinent past medical history. History reviewed. No pertinent surgical history.   Family History: History reviewed. No pertinent family history.   Family Psychiatric  History: Patient father has PTSD  Social History: Patient lives with girlfriend and 45-year-old daughter from other girlfriend.  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   Sleep: Fair  Appetite:  NA  Current Medications: Current  Facility-Administered Medications  Medication Dose Route Frequency Provider Last Rate Last Admin   0.9 %  sodium chloride infusion   Intravenous PRN Julian Hy, DO   Paused at 11/08/20 1130   acetaminophen (TYLENOL) tablet 650 mg  650 mg Oral Q6H PRN Priscella Mann, RPH       [START ON 11/17/2020] amLODipine (NORVASC) tablet 10 mg  10 mg Oral Daily Sloan Leiter B, RPH       apixaban (ELIQUIS) tablet 10 mg  10 mg Oral BID Jacky Kindle, MD   10 mg at 11/16/20 8366   Followed by   Derrill Memo ON 11/22/2020] apixaban (ELIQUIS) tablet 5 mg  5 mg Oral BID Jacky Kindle, MD       carvedilol (COREG) tablet 25 mg  25 mg Oral BID WC Millen, Jessica B, RPH       chlorhexidine gluconate (MEDLINE KIT) (PERIDEX) 0.12 % solution 15 mL  15 mL Mouth Rinse BID Estill Cotta, NP   15 mL at 11/16/20 0827   Chlorhexidine Gluconate Cloth 2 % PADS 6 each  6 each Topical Q0600 Chesley Mires, MD   6 each at 11/16/20 2947   clonazePAM (KLONOPIN) disintegrating tablet 1 mg  1 mg Oral BID PRN Priscella Mann, RPH       cloNIDine (CATAPRES) tablet 0.1 mg  0.1 mg Oral TID Sloan Leiter B, RPH       docusate sodium (COLACE) capsule 100 mg  100 mg Oral BID Sloan Leiter B, RPH       feeding supplement (ENSURE ENLIVE / ENSURE PLUS) liquid 237 mL  237 mL Oral BID BM [place] Donnamarie Poag, DO  free water 200 mL  200 mL Per Tube Q2H Audria Nine, DO   200 mL at 11/15/20 1200   haloperidol lactate (HALDOL) injection 5 mg  5 mg Intravenous Q4H PRN Jacky Kindle, MD   5 mg at 11/16/20 0158   hydrALAZINE (APRESOLINE) injection 10-20 mg  10-20 mg Intravenous Q4H PRN Stretch, Marily Lente, MD   20 mg at 11/13/20 0434   hydrALAZINE (APRESOLINE) tablet 50 mg  50 mg Oral Q6H Millen, Steffanie Dunn, RPH       HYDROmorphone (DILAUDID) injection 0.5-1 mg  0.5-1 mg Intravenous Q2H PRN Lacinda Axon, MD   0.5 mg at 11/15/20 1241   MEDLINE mouth rinse  15 mL Mouth Rinse 10 times per day Estill Cotta, NP   15  mL at 11/15/20 2157   multivitamin with minerals tablet 1 tablet  1 tablet Oral Daily [place] Donnamarie Poag, DO       ondansetron [facility]) injection 4 mg  4 mg Intravenous Q6H PRN Deland Pretty, MD   4 mg at 11/16/20 1003   pantoprazole (PROTONIX) EC tablet 40 mg  40 mg Oral QHS Priscella Mann, RPH       [START ON 11/17/2020] polyethylene glycol (MIRALAX / GLYCOLAX) packet 17 g  17 g Oral Daily Millen, Jessica B, RPH       polyethylene glycol (MIRALAX / GLYCOLAX) packet 17 g  17 g Oral Daily PRN Sloan Leiter B, RPH       prazosin (MINIPRESS) capsule 4 mg  4 mg Oral QHS Priscella Mann, RPH       senna (SENOKOT) tablet 8.6 mg  1 tablet Oral BID Priscella Mann, RPH       sertraline (ZOLOFT) tablet 25 mg  25 mg Oral QHS Millen, Jessica B, RPH       sodium chloride flush (NS) 0.9 % injection 10-40 mL  10-40 mL Intracatheter Q12H Chesley Mires, MD   10 mL at 11/15/20 2057   sodium chloride flush (NS) 0.9 % injection 10-40 mL  10-40 mL Intracatheter PRN Chesley Mires, MD       sodium chloride flush (NS) 0.9 % injection 10-40 mL  10-40 mL Intracatheter Q12H Audria Nine, DO   10 mL at 11/15/20 2057   sodium chloride flush (NS) 0.9 % injection 10-40 mL  10-40 mL Intracatheter PRN Audria Nine, DO       [START ON 11/17/2020] vitamin B-12 (CYANOCOBALAMIN) tablet 1,000 mcg  1,000 mcg Oral Daily Priscella Mann, [facility]        Lab Results:  CBC Latest Ref Rng & Units 11/16/2020 11/14/2020 11/12/2020  WBC 4.0 - 10.5 K/uL 8.6 10.5 11.9(H)  Hemoglobin 13.0 - 17.0 g/dL 8.8(L) 9.6(L) 8.3(L)  Hematocrit 39.0 - 52.0 % 27.5(L) 29.9(L) 26.1(L)  Platelets 150 - 400 K/uL 506(H) 561(H) 483(H)   BMP Latest Ref Rng & Units 11/16/2020 11/15/2020 11/14/2020  Glucose 70 - 99 mg/dL 118(H) 121(H) 116(H)  BUN 6 - 20 mg/dL 20 23(H) 22(H)  Creatinine 0.61 - 1.24 mg/dL 1.22 1.23 1.18  Sodium 135 - 145 mmol/L 141 147(H) 146(H)  Potassium 3.5 - 5.1 mmol/L 3.7 4.0 3.9  Chloride 98 - 111 mmol/L 111  116(H) 117(H)  CO2 22 - 32 mmol/L 22 22 20(L)  Calcium 8.9 - 10.3 mg/dL 8.7(L) 9.1 8.7(L)    Blood Alcohol level:  Lab Results  Component Value Date   ETH <10 00/92/3300    Metabolic Disorder Labs: No results found for: HGBA1C,  MPG No results found for: PROLACTIN Lab Results  Component Value Date   TRIG 90 11/08/2020   Musculoskeletal: Strength & Muscle Tone: within normal limits Gait & Station: Deferred Patient leans: N/A   Psychiatric Specialty Exam: Physical Exam Vitals and nursing note reviewed.  Constitutional:      General: He is not in acute distress. HENT:     Head: Normocephalic and atraumatic.  Cardiovascular:     Rate and Rhythm: Tachycardia present.  Pulmonary:     Effort: Pulmonary effort is normal.  Abdominal:     Palpations: Abdomen is soft.  Neurological:     Mental Status: He is alert and oriented to person, place, and time.     Review of Systems  Blood pressure 137/90, pulse (!) 104, temperature 98.6 F (37 C), temperature source Oral, resp. rate (!) 32, height 5' 10"  (1.778 m), weight 93.3 kg, SpO2 97 %.Body mass index is 29.51 kg/m.  General Appearance: Casual  Eye Contact:  Good  Speech:  Normal Rate  Volume:  Normal  Mood:  Anxious  Affect:  Restricted  Thought Process:  Coherent  Orientation:  Full (Time, Place, and Person)  Thought Content:  Obsessions and Rumination  Suicidal Thoughts:  No  Homicidal Thoughts:  No  Memory:  Immediate;   Fair Recent;   Fair Remote;   Fair  Judgement:  Fair  Insight:  Fair  Psychomotor Activity:  Normal  Concentration:  Concentration: Good and Attention Span: Good  Recall:  [place] of Knowledge:  Good  Language:  Good  Akathisia:  No  Handed:  Right  AIMS (if indicated):     Assets:  Communication Skills Desire for Improvement Financial Resources/Insurance Housing Resilience Social Support  ADL's:  Impaired  Cognition:  WNL  Sleep:      Assessment: 40 year old male with past  psychiatric history of major depressive disorder, generalized anxiety disorder, PTSD, ?psychosis either due to PTSD or MDD with psychosis admitted for acute respiratory failure, rhabdomyolysis and acute hyperactive delirium. -On evaluation today patient denies any suicidal or homicidal ideations.  He does not seem like responding to internal stimuli and denies auditory and visual hallucinations, last was yesterday's evening.  He looks extremely anxious and concerned about his overall health and eager to go home. -Prior to hospitalization he was on prazosin 4 mg nightly daily, bupropion 150 mg daily, quetiapine 400 mg nightly, lorazepam 1 mg nightly.  Yesterday we started him on Zoloft to target his anxiety and depression and discontinued Wellbutrin.  RN reported that patient did well after receiving his psychiatric medication we suggest to give his medication~8pm or earlier.  His ongoing hallucinations can be because of his severe PTSD or it can be MDD with psychosis, unable to clarify at this point. -We plan to repeat CK level tomorrow morning and EKG.   Treatment Plan: --Increase Zoloft to 50 mg for anxiety and depression --Hold Seroquel, follow-up CK levels and EKG tomorrow morning --Continue prazosin 4 mg nightly to address PTSD --Daily contact with patient to assess and evaluate symptoms and progress in treatment. --Psychiatry will continue to follow  Honor Junes, MD 11/16/2020, 11:24 AM  PGY-1, Resident

## 2020-11-16 NOTE — Progress Notes (Signed)
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  During ED evaluation he became combative, refusing care and notably tachycardic with heart rates of 280 with a temperature of 102.5.  Patient was emergently intubated for airway protection and admitted to the PCCM service.  Patient was found to have rhabdomyolysis, acute renal failure requiring HD.  There was concern for Wellbutrin and Seroquel contributing to this.  Several attempts at extubation were made that were unsuccessful and ultimately patient underwent tracheostomy on 11/11/2020.  Patient was transferred from [facility] to [facility] on 11/16/2020.   Assessment & Plan:   Principal Problem:   Acute on chronic respiratory failure with hypoxia (HCC) Active Problems:   PTSD (post-traumatic stress disorder)   Severe episode of recurrent major depressive disorder, with psychotic features ([place])   Generalized anxiety disorder   AKI (acute kidney injury) ([place])   Encounter for central line placement   Abdominal distention   Tachycardia   Primary hypertension   Ileus (HCC)   Acute respiratory failure with hypoxemia ([place])   Acute hypoxemic respiratory failure s/p trach Patient nurser presented to the ED with chest pain, became belligerent with elevated heart rate and respiratory distress and was subsequently intubated for airway protection.  Patient failed extubation x2 in the intensive care unit and ultimately had a tracheostomy placed on 11/11/2020. --Respiratory/speech therapy following, currently on room air  Acute metabolic encephalopathy due to hypoactive delirium: Resolved Hx PTSD, GAD, MDD Patient with acute psychosis on admission.  On multiple psychoactive medications to  include Seroquel and Wellbutrin outpatient.  UDS and head CT negative on admission.  Denies history of drug abuse but family suspects possible use.  Initially requiring Precedex infusion, which has been titrated off. --Psychiatry following, appreciate assistance --Zoloft 50 mg p.o. nightly --Prazosin 4 mg p.o. daily --Klonopin 1 mg p.o. twice daily as needed anxiety --Haldol as needed agitation  Multilobar pneumonia, aspiration pneumonitis --Completed 7-day course of cefepime  Bilateral gastrocnemius DVTs Suspected PE TTE 11/07/2020 with no RV heart strain appreciated.  Normal LVEF without LV regional wall motion abnormalities. --Eliquis  Dysphagia Core track now removed, speech therapy following.  Dysphagia 3 diet with thin liquids.  Acute renal failure Rhabdomyolysis Creatinine 0.99 on admission, trended up to a high of 11.94 during hospitalization. CK high of 41K.  Likely secondary to rhabdomyolysis/pigment nephropathy from medications use versus questionable shoulder injury.  Renal ultrasound shows right kidney normal in appearance, visualization left kidney limited with no abnormality seen and bladder decompressed.  Nephrology was consulted and patient underwent HD with improvement of renal function.  Hemodialysis was discontinued and nephrology signed off on 11/09/2020. --Cr 0.99>>11.94>>1.22 --Encouraged continued oral intake --Avoid nephrotoxins, renal dose all medications --CBG before every meal/at bedtime  Anemia secondary to critical illness Hemoglobin 8.8, stable. --Vitamin B12 daily  Essential hypertension BP 153/100 --Amlodipine 10 mg p.o. daily --Carvedilol 25 mg p.o. twice daily --Clonidine 0.1 mg p.o. 3 times daily --Hydralazine 50 mg p.o. every 6 hours  Weakness/deconditioning/debility. --Continue PT/OT efforts while inpatient, currently recommending CIR   DVT prophylaxis:    Code Status: Full Code Family Communication:   Disposition Plan:  Level of care:  Progressive Status is: Inpatient  Remains inpatient appropriate because:Ongoing diagnostic testing needed not appropriate  for outpatient work up, Unsafe d/c plan, IV treatments appropriate due to intensity of illness or inability to take PO and Inpatient level of care appropriate due to severity of illness   Dispo: The patient is from: Home              Anticipated d/c is to: CIR              Patient currently is medically stable to d/c.   Difficult to place patient No    Consultants:   PCCM  Nephrology  Psychiatry  Neurology  Procedures:   ETT 3/3 >>3/4  ETT 3/6 >> 3/14  HD catheter 3/9 >> 3/15  ETT 3/16 >>  Trach 3/18 >>  Antimicrobials:  Ceftriaxone 3/3 >> 3/6 Azithromycin 3/3 x 1 Cefazolin 3/6 >> 3/7 Ceftriaxone 3/7 >> 3/9 Vanc 3/6 X 1 dose Vanc 3/7 >> 3/8 Ceftriaxone 3/13 >> 3/15 Cefepime 3/16 >>  Significant Diagnostic Tests:   Head CT 3/3 >> sphenoid sinusitis  Echo 3/3 >> nml LVEF, no WMA  MRI right shoulder 3/6 >> no evidence of septic joint, no effusion, severe edema and muscles around shoulder joint consistent with rhabdo  Head CT 3/6 >> neg  CSF cell count 3/7 >> WBC 2, RBC 2 (hypocellular)  MRI brain 3/7 >> normal, paranasal sinus mucosal thickening, small mastoid effusions  BLLE Ultrasound 3/14 >>BL DVT gastrocnemius  Limited ECHO 3/15 >> EF 60, no evidence of RV strain  Chest CT 3/17 >> Severe multilobar bilateral bronchopneumonia, trace R pleural effusion     Subjective: Patient seen and examined bedside, resting comfortably.  No specific complaints this morning.  Currently tolerating dysphagia diet.  Continues with weakness and fatigue.  No family present at bedside.  Denies chest pain, no shortness of breath, no abdominal pain.  No acute events overnight per nursing staff.  Objective: Vitals:   11/16/20 1100 11/16/20 1101 11/16/20 1142 11/16/20 1200  BP: 137/90  (!) 146/94 (!) 154/111  Pulse: (!) 104   (!) 110  Resp:     (!) 29  Temp:  98.6 F (37 C)    TempSrc:  Oral    SpO2:    95%  Weight:      Height:        Intake/Output Summary (Last 24 hours) at 11/16/2020 1252 Last data filed at 11/16/2020 1100 Gross per 24 hour  Intake 850 ml  Output 2020 ml  Net -1170 ml   Filed Weights   11/14/20 0400 11/15/20 0400 11/16/20 0500  Weight: 93.8 kg 92.2 kg 93.3 kg    Examination:  General exam: Appears calm and comfortable, chronically ill in appearance Respiratory system: Coarse breath sounds bilaterally, normal respiratory effort without accessory muscle use, trach noted, on room air Cardiovascular system: S1 & S2 heard, RRR. No JVD, murmurs, rubs, gallops or clicks. No pedal edema. Gastrointestinal system: Abdomen is nondistended, soft and nontender. No organomegaly or masses felt. Normal bowel sounds heard. Central nervous system: Alert and oriented. No focal neurological deficits. Extremities: Symmetric 5 x 5 power. Skin: No rashes, lesions or ulcers Psychiatry: Judgement and insight appear normal. Mood & affect appropriate.     Data Reviewed: I have personally reviewed following labs and imaging studies  CBC: Recent Labs  Lab 11/10/20 0354 11/11/20 0511 11/12/20 0436 11/14/20 0033 11/16/20 0030  WBC 11.5* 10.6* 11.9* 10.5 8.6  HGB 8.9* 8.5* 8.3* 9.6* 8.8*  HCT 27.9* 27.6* 26.1* 29.9* 27.5*  MCV 97.2 100.0 99.2 96.5 96.8  PLT  504* 503* 483* 561* 425*   Basic Metabolic Panel: Recent Labs  Lab 11/13/20 0238 11/14/20 0033 11/14/20 0918 11/14/20 1906 11/15/20 0157 11/16/20 0030  NA 153* 150*  --  146* 147* 141  K 2.6* 3.3*  --  3.9 4.0 3.7  CL 116* 118*  --  117* 116* 111  CO2 25 24  --  20* 22 22  GLUCOSE 120* 131*  --  116* 121* 118*  BUN 27* 20  --  22* 23* 20  CREATININE 1.43* 1.19  --  1.18 1.23 1.22  CALCIUM 8.9 8.6*  --  8.7* 9.1 8.7*  MG  --   --  1.9  --   --   --    GFR: Estimated Creatinine Clearance: 90.5 mL/min (by C-G formula based on SCr of 1.22  mg/dL). Liver Function Tests: Recent Labs  Lab 11/12/20 0436 11/13/20 0238 11/14/20 0033 11/15/20 0157 11/16/20 0030  AST 27 30 32 60* 58*  ALT _0 ALKPHOS 60 69 62 67 57  BILITOT 0.7 0.5 0.5 0.8 0.9  PROT 6.0* 7.0 6.8 7.3 6.9  ALBUMIN 2.8* 3.3* 3.2* 3.4* 3.2*   No results for input(s): LIPASE, AMYLASE in the last 168 hours. No results for input(s): AMMONIA in the last 168 hours. Coagulation Profile: No results for input(s): INR, PROTIME in the last 168 hours. Cardiac Enzymes: Recent Labs  Lab 11/10/20 0354 11/14/20 0033  CKTOTAL 211 560*   BNP (last 3 results) No results for input(s): PROBNP in the last 8760 hours. HbA1C: No results for input(s): HGBA1C in the last 72 hours. CBG: Recent Labs  Lab 11/15/20 1942 11/15/20 2327 11/16/20 0322 11/16/20 0709 11/16/20 1059  GLUCAP 106* 112* 98 102* 107*   Lipid Profile: No results for input(s): CHOL, HDL, LDLCALC, TRIG, CHOLHDL, LDLDIRECT in the last 72 hours. Thyroid Function Tests: No results for input(s): TSH, T4TOTAL, FREET4, T3FREE, THYROIDAB in the last 72 hours. Anemia Panel: No results for input(s): VITAMINB12, FOLATE, FERRITIN, TIBC, IRON, RETICCTPCT in the last 72 hours. Sepsis Labs: Recent Labs  Lab 11/10/20 0354 11/11/20 0511  PROCALCITON 0.49 0.27    Recent Results (from the past 240 hour(s))  Culture, Respiratory w Gram Stain     Status: None   Collection Time: 11/10/20 10:33 AM   Specimen: Tracheal Aspirate; Respiratory  Result Value Ref Range Status   Specimen Description TRACHEAL ASPIRATE  Final   Special Requests Normal  Final   Gram Stain   Final    RARE WBC PRESENT, PREDOMINANTLY MONONUCLEAR NO ORGANISMS SEEN    Culture   Final    RARE Normal respiratory flora-no Staph aureus or Pseudomonas seen Performed at [facility], [address] [place], [place] [address]    Report Status 11/12/2020 FINAL  Final  MRSA PCR Screening     Status: None   Collection Time:  11/10/20  1:03 PM   Specimen: Nasal Mucosa; Nasopharyngeal  Result Value Ref Range Status   MRSA by PCR NEGATIVE NEGATIVE Final    Comment:        The GeneXpert MRSA Assay (FDA approved for NASAL specimens only), is one component of a comprehensive MRSA colonization surveillance program. It is not intended to diagnose MRSA infection nor to guide or monitor treatment for MRSA infections. Performed at [facility], [place] [address] [place], [place] [address]          Radiology Studies: DG Swallowing [facility] Pathology  Result Date:  11/15/2020 Objective Swallowing Evaluation: Type of Study: MBS-Modified Barium Swallow Study  Patient Details Name: ROZELL THEILER MRN: [id number] Date of Birth: [birth year] Today's Date: 11/15/2020 Time: SLP Start Time (ACUTE ONLY): 1412 -SLP Stop Time (ACUTE ONLY): 1500 SLP Time Calculation (min) (ACUTE ONLY): 48 min Past Medical History: No past medical history on file. Past Surgical History: No past surgical history on file. HPI: Pt is a 40 y/o male with PMH PTSD, and anxiety. He was admitted 3/2 with two weeks of chest pain.  Pt was emergently intubated 3/2-3/4. EEG suggested moderate, diffuse encephalopathy without seizure activity. Found to be in rhabdo with AKI. Pt reintubated 3/6-3/14 for airway protection due to hypoactive delirium. BSE 3/15: no signs of aspiration or dysphagia. He both passed a 3 oz water swallow with RN and SLP. A clear liquid diet had been started by MD at that time and SLP recommended advancement per critical care. Episode of desaturation after eating on 3/15, MD suspected silent aspiration. Vasovagal event with ice water. Pt re-intubated 3/17-trach 3/18.  CXR 3/18: Patchy interstitial and airspace opacities persist compatible with multi lobar pneumonia.  Subjective: alert Assessment / Plan / Recommendation CHL IP CLINICAL IMPRESSIONS 11/15/2020 Clinical Impression Pt presented with normal oropharyngeal function across all  consistencies. There was adequate mastication of solids, timely swallow trigger, and reliable laryngeal vestibule closure. There was NO ASPIRATION. Pharyngeal stripping was normal and there was no residue post-swallow.  Pt used PMV for most of study - it was removed and he was tested with thin liquids for the last few trials to determine if its absence altered the physiology. He demonstrated one incident of penetration with the valve off.  Recommend beginning a dysphagia 3 diet with thin liquids. Give meds whole in puree. Use valve when eating/drinking to restore cough and benefits for airway protection. D/W pt and RN.  Pt was very happy with results and consumed 8 oz of orange juice after he returned to his room.  SLP will follow briefly to ensure safety/toleration. SLP Visit Diagnosis Dysphagia, oropharyngeal phase (R13.12) Attention and concentration deficit following -- Frontal lobe and executive function deficit following -- Impact on safety and function Mild aspiration risk   CHL IP TREATMENT RECOMMENDATION 11/15/2020 Treatment Recommendations Therapy as outlined in treatment plan below   Prognosis 11/15/2020 Prognosis for Safe Diet Advancement Good Barriers to Reach Goals -- Barriers/Prognosis Comment -- CHL IP DIET RECOMMENDATION 11/15/2020 SLP Diet Recommendations Dysphagia 3 (Mech soft) solids;Thin liquid Liquid Administration via Cup;Straw Medication Administration Whole meds with puree Compensations Minimize environmental distractions Postural Changes --   CHL IP OTHER RECOMMENDATIONS 11/15/2020 Recommended Consults -- Oral Care Recommendations Oral care BID Other Recommendations Place PMSV during PO intake   CHL IP FOLLOW UP RECOMMENDATIONS 11/15/2020 Follow up Recommendations Inpatient Rehab   CHL IP FREQUENCY AND DURATION 11/15/2020 Speech Therapy Frequency (ACUTE ONLY) min 2x/week Treatment Duration 2 weeks      CHL IP ORAL PHASE 11/15/2020 Oral Phase WFL Oral - Pudding Teaspoon -- Oral - Pudding Cup --  Oral - Honey Teaspoon -- Oral - Honey Cup -- Oral - Nectar Teaspoon -- Oral - Nectar Cup -- Oral - Nectar Straw -- Oral - Thin Teaspoon -- Oral - Thin Cup -- Oral - Thin Straw -- Oral - Puree -- Oral - Mech Soft -- Oral - Regular -- Oral - Multi-Consistency -- Oral - Pill -- Oral Phase - Comment --  CHL IP PHARYNGEAL PHASE 11/15/2020 Pharyngeal Phase WFL Pharyngeal- Pudding Teaspoon -- Pharyngeal --  Pharyngeal- Pudding Cup -- Pharyngeal -- Pharyngeal- Honey Teaspoon -- Pharyngeal -- Pharyngeal- Honey Cup -- Pharyngeal -- Pharyngeal- Nectar Teaspoon -- Pharyngeal -- Pharyngeal- Nectar Cup -- Pharyngeal -- Pharyngeal- Nectar Straw -- Pharyngeal -- Pharyngeal- Thin Teaspoon -- Pharyngeal -- Pharyngeal- Thin Cup -- Pharyngeal -- Pharyngeal- Thin Straw -- Pharyngeal -- Pharyngeal- Puree -- Pharyngeal -- Pharyngeal- Mechanical Soft -- Pharyngeal -- Pharyngeal- Regular -- Pharyngeal -- Pharyngeal- Multi-consistency -- Pharyngeal -- Pharyngeal- Pill -- Pharyngeal -- Pharyngeal Comment --  CHL IP CERVICAL ESOPHAGEAL PHASE 11/15/2020 Cervical Esophageal Phase WFL Pudding Teaspoon -- Pudding Cup -- Honey Teaspoon -- Honey Cup -- Nectar Teaspoon -- Nectar Cup -- Nectar Straw -- Thin Teaspoon -- Thin Cup -- Thin Straw -- Puree -- Mechanical Soft -- Regular -- Multi-consistency -- Pill -- Cervical Esophageal Comment -- Juan Quam Laurice 11/15/2020, 3:11 PM                   Scheduled Meds: . [START ON 11/17/2020] amLODipine  10 mg Oral Daily  . apixaban  10 mg Oral BID   Followed by  . [START ON 11/22/2020] apixaban  5 mg Oral BID  . carvedilol  25 mg Oral BID WC  . chlorhexidine gluconate (MEDLINE KIT)  15 mL Mouth Rinse BID  . Chlorhexidine Gluconate Cloth  6 each Topical Q0600  . cloNIDine  0.1 mg Oral TID  . docusate sodium  100 mg Oral BID  . feeding supplement  237 mL Oral BID BM  . free water  200 mL Per Tube Q2H  . hydrALAZINE  50 mg Oral Q6H  . mouth rinse  15 mL Mouth Rinse 10 times per day  .  multivitamin with minerals  1 tablet Oral Daily  . pantoprazole  40 mg Oral QHS  . [START ON 11/17/2020] polyethylene glycol  17 g Oral Daily  . prazosin  4 mg Oral QHS  . senna  1 tablet Oral BID  . sertraline  50 mg Oral QHS  . sodium chloride flush  10-40 mL Intracatheter Q12H  . sodium chloride flush  10-40 mL Intracatheter Q12H  . [START ON 11/17/2020] vitamin B-12  1,000 mcg Oral Daily   Continuous Infusions: . sodium chloride Stopped (11/08/20 1130)     LOS: 20 days    Time spent: 39 minutes spent on chart review, discussion with nursing staff, consultants, updating family and interview/physical exam; more than 50% of that time was spent in counseling and/or coordination of care.    Duriel Deery J [place] DO [facility] Available via Epic secure chat 7am-7pm After these hours, please refer to coverage provider listed on [email or web address] 11/16/2020, 12:52 PM

## 2020-11-16 NOTE — Progress Notes (Signed)
CCM MD (Dr Merrily Pew) made aware of large blood clots found around trach and trach appearing extremely bloody at this time. MD to bedside at this time and is consulting another CCM MD.

## 2020-11-17 LAB — BASIC METABOLIC PANEL
Anion gap: 8 (ref 5–15)
BUN: 17 mg/dL (ref 6–20)
CO2: 22 mmol/L (ref 22–32)
Calcium: 9 mg/dL (ref 8.9–10.3)
Chloride: 109 mmol/L (ref 98–111)
Creatinine, Ser: 1.17 mg/dL (ref 0.61–1.24)
GFR, Estimated: >60 mL/min (ref >60)
Glucose, Bld: 104 mg/dL — ABNORMAL HIGH (ref 70–99)
Potassium: 3.5 mmol/L (ref 3.5–5.1)
Sodium: 139 mmol/L (ref 135–145)

## 2020-11-17 LAB — GLUCOSE, CAPILLARY
Glucose-Capillary: 90 mg/dL (ref 70–99)
Glucose-Capillary: 105 mg/dL — ABNORMAL HIGH (ref 70–99)
Glucose-Capillary: 115 mg/dL — ABNORMAL HIGH (ref 70–99)
Glucose-Capillary: 103 mg/dL — ABNORMAL HIGH (ref 70–99)
Glucose-Capillary: 120 mg/dL — ABNORMAL HIGH (ref 70–99)
Glucose-Capillary: 108 mg/dL — ABNORMAL HIGH (ref 70–99)

## 2020-11-17 LAB — MAGNESIUM: Magnesium: 1.6 mg/dL — ABNORMAL LOW (ref 1.7–2.4)

## 2020-11-17 LAB — CK: Total CK: 1685 U/L — ABNORMAL HIGH (ref 49–397)

## 2020-11-17 MED ORDER — MAGNESIUM SULFATE 2 GM/50ML IV SOLN
2.0000 g | Freq: Once | INTRAVENOUS | Status: AC
  Administered 2020-11-17: 2 g via INTRAVENOUS
  Filled 2020-11-17: qty 50

## 2020-11-17 NOTE — Progress Notes (Signed)
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  During ED evaluation he became combative, refusing care and notably tachycardic with heart rates of 280 with a temperature of 102.5.  Patient was emergently intubated for airway protection and admitted to the PCCM service.  Patient was found to have rhabdomyolysis, acute renal failure requiring HD.  There was concern for Wellbutrin and Seroquel contributing to this.  Several attempts at extubation were made that were unsuccessful and ultimately patient underwent tracheostomy on 11/11/2020.  Patient was transferred from [facility] to [facility] on 11/16/2020.   Assessment & Plan:   Principal Problem:   Acute on chronic respiratory failure with hypoxia (HCC) Active Problems:   PTSD (post-traumatic stress disorder)   Severe episode of recurrent major depressive disorder, with psychotic features ([place])   Generalized anxiety disorder   AKI (acute kidney injury) ([place])   Encounter for central line placement   Abdominal distention   Tachycardia   Primary hypertension   Ileus (HCC)   Acute respiratory failure with hypoxemia ([place])   Acute hypoxemic respiratory failure s/p trach Patient nurser presented to the ED with chest pain, became belligerent with elevated heart rate and respiratory distress and was subsequently intubated for airway protection.  Patient failed extubation x2 in the intensive care unit and ultimately had a tracheostomy placed on 11/11/2020. --Respiratory/speech therapy following, currently 5 L trach collar --Seen by PCCM today for bleeding from tracheostomy site, injected with lidocaine/epinephrine and thrombin pad applied with resolution  Acute metabolic encephalopathy due  to hypoactive delirium: Resolved Hx PTSD, GAD, MDD Patient with acute psychosis on admission.  On multiple psychoactive medications to include Seroquel and Wellbutrin outpatient.  UDS and head CT negative on admission.  Denies history of drug abuse but family suspects possible use.  Initially requiring Precedex infusion, which has been titrated off. --Psychiatry following, appreciate assistance --Zoloft 50 mg p.o. nightly --Prazosin 4 mg p.o. daily --Klonopin 1 mg p.o. twice daily as needed anxiety --Haldol as needed agitation  Multilobar pneumonia, aspiration pneumonitis --Completed 7-day course of cefepime  Bilateral gastrocnemius DVTs Suspected PE TTE 11/07/2020 with no RV heart strain appreciated.  Normal LVEF without LV regional wall motion abnormalities. --Eliquis  Dysphagia Core track now removed, speech therapy following.  Dysphagia 3 diet with thin liquids.  Acute renal failure Rhabdomyolysis Creatinine 0.99 on admission, trended up to a high of 11.94 during hospitalization. CK high of 41K.  Likely secondary to rhabdomyolysis/pigment nephropathy from medications use versus questionable shoulder injury.  Renal ultrasound shows right kidney normal in appearance, visualization left kidney limited with no abnormality seen and bladder decompressed.  Nephrology was consulted and patient underwent HD with improvement of renal function.  Hemodialysis was discontinued and nephrology signed off on 11/09/2020. --Cr 0.99>>11.94>>1.22>1.17 --Encouraged continued oral intake --Avoid nephrotoxins, renal dose all medications --CBG before every meal/at bedtime  Anemia secondary to critical illness Hemoglobin 8.8, stable. --Vitamin B12 daily  Essential hypertension BP 153/100 --Amlodipine 10 mg p.o. daily --Carvedilol 25 mg p.o. twice daily --Clonidine 0.1 mg p.o. 3 times daily --Hydralazine 50 mg p.o. every 6 hours  Hypomagnesemia Magnesium 1.6, will replete. --Repeat electrolytes in  a.m.  Weakness/deconditioning/debility. --Continue PT/OT efforts while inpatient, currently recommending CIR   DVT prophylaxis:  Code Status: Full Code Family Communication:   Disposition Plan:  Level of care: Progressive Status is: Inpatient  Remains inpatient appropriate because:Ongoing diagnostic testing needed not appropriate for outpatient work up, Unsafe d/c plan, IV treatments appropriate due to intensity of illness or inability to take PO and Inpatient level of care appropriate due to severity of illness   Dispo: The patient is from: Home              Anticipated d/c is to: CIR versus SNF              Patient currently is medically stable to d/c.   Difficult to place patient No    Consultants:   PCCM  Nephrology  Psychiatry - signed off 3/24  Neurology  Procedures:   ETT 3/3 >>3/4  ETT 3/6 >> 3/14  HD catheter 3/9 >> 3/15  ETT 3/16 >>  Trach 3/18 >>  Antimicrobials:  Ceftriaxone 3/3 >> 3/6 Azithromycin 3/3 x 1 Cefazolin 3/6 >> 3/7 Ceftriaxone 3/7 >> 3/9 Vanc 3/6 X 1 dose Vanc 3/7 >> 3/8 Ceftriaxone 3/13 >> 3/15 Cefepime 3/16 >>  Significant Diagnostic Tests:   Head CT 3/3 >> sphenoid sinusitis  Echo 3/3 >> nml LVEF, no WMA  MRI right shoulder 3/6 >> no evidence of septic joint, no effusion, severe edema and muscles around shoulder joint consistent with rhabdo  Head CT 3/6 >> neg  CSF cell count 3/7 >> WBC 2, RBC 2 (hypocellular)  MRI brain 3/7 >> normal, paranasal sinus mucosal thickening, small mastoid effusions  BLLE Ultrasound 3/14 >>BL DVT gastrocnemius  Limited ECHO 3/15 >> EF 60, no evidence of RV strain  Chest CT 3/17 >> Severe multilobar bilateral bronchopneumonia, trace R pleural effusion     Subjective: Patient seen and examined bedside, resting comfortably.  RN at bedside.  Nursing reported bleeding surrounding trach site early this morning, seen by PCCM in which lidocaine, epinephrine and thrombin pad applied  with resolution.  Patient with no specific complaints this morning.  Currently tolerating dysphagia diet.  Continues with weakness and fatigue.  No family present at bedside.  Denies chest pain, no shortness of breath, no abdominal pain.  No acute events overnight per nursing staff.  Objective: Vitals:   11/17/20 0900 11/17/20 0914 11/17/20 1000 11/17/20 1124  BP: 109/68 106/79 132/86 116/86  Pulse:   (!) 107   Resp:      Temp:      TempSrc:      SpO2:      Weight:      Height:        Intake/Output Summary (Last 24 hours) at 11/17/2020 1128 Last data filed at 11/17/2020 0800 Gross per 24 hour  Intake 694.1 ml  Output 2300 ml  Net -1605.9 ml   Filed Weights   11/15/20 0400 11/16/20 0500 11/17/20 0500  Weight: 92.2 kg 93.3 kg 95.4 kg    Examination:  General exam: Appears calm and comfortable, chronically ill in appearance Respiratory system: Coarse breath sounds bilaterally, normal respiratory effort without accessory muscle use, trach noted with dried blood surrounding and thrombin pad in place, on 5 L trach collar Cardiovascular system: S1 & S2 heard, RRR. No JVD, murmurs, rubs, gallops or clicks. No pedal edema. Gastrointestinal system: Abdomen is nondistended, soft and nontender. No organomegaly or masses felt. Normal bowel sounds heard. Central nervous system: Alert and oriented. No focal neurological deficits. Extremities: Symmetric 5 x 5 power. Skin: No rashes, lesions or ulcers Psychiatry: Judgement and insight  appear normal. Mood & affect appropriate.     Data Reviewed: I have personally reviewed following labs and imaging studies  CBC: Recent Labs  Lab 11/11/20 0511 11/12/20 0436 11/14/20 0033 11/16/20 0030  WBC 10.6* 11.9* 10.5 8.6  HGB 8.5* 8.3* 9.6* 8.8*  HCT 27.6* 26.1* 29.9* 27.5*  MCV 100.0 99.2 96.5 96.8  PLT 503* 483* 561* 579*   Basic Metabolic Panel: Recent Labs  Lab 11/14/20 0033 11/14/20 0918 11/14/20 1906 11/15/20 0157 11/16/20 0030  11/17/20 0313  NA 150*  --  146* 147* 141 139  K 3.3*  --  3.9 4.0 3.7 3.5  CL 118*  --  117* 116* 111 109  CO2 24  --  20* 22 22 22   GLUCOSE 131*  --  116* 121* 118* 104*  BUN 20  --  22* 23* 20 17  CREATININE 1.19  --  1.18 1.23 1.22 1.17  CALCIUM 8.6*  --  8.7* 9.1 8.7* 9.0  MG  --  1.9  --   --   --  1.6*   GFR: Estimated Creatinine Clearance: 95.4 mL/min (by C-G formula based on SCr of 1.17 mg/dL). Liver Function Tests: Recent Labs  Lab 11/12/20 0436 11/13/20 0238 11/14/20 0033 11/15/20 0157 11/16/20 0030  AST 27 30 32 60* 58*  ALT 18 22 19 23 23   ALKPHOS 60 69 62 67 57  BILITOT 0.7 0.5 0.5 0.8 0.9  PROT 6.0* 7.0 6.8 7.3 6.9  ALBUMIN 2.8* 3.3* 3.2* 3.4* 3.2*   No results for input(s): LIPASE, AMYLASE in the last 168 hours. No results for input(s): AMMONIA in the last 168 hours. Coagulation Profile: No results for input(s): INR, PROTIME in the last 168 hours. Cardiac Enzymes: Recent Labs  Lab 11/14/20 0033 11/17/20 0313  CKTOTAL 560* 1,685*   BNP (last 3 results) No results for input(s): PROBNP in the last 8760 hours. HbA1C: No results for input(s): HGBA1C in the last 72 hours. CBG: Recent Labs  Lab 11/16/20 1931 11/16/20 2332 11/17/20 0330 11/17/20 0736 11/17/20 1119  GLUCAP 112* 108* 90 105* 115*   Lipid Profile: No results for input(s): CHOL, HDL, LDLCALC, TRIG, CHOLHDL, LDLDIRECT in the last 72 hours. Thyroid Function Tests: No results for input(s): TSH, T4TOTAL, FREET4, T3FREE, THYROIDAB in the last 72 hours. Anemia Panel: No results for input(s): VITAMINB12, FOLATE, FERRITIN, TIBC, IRON, RETICCTPCT in the last 72 hours. Sepsis Labs: Recent Labs  Lab 11/11/20 0511  PROCALCITON 0.27    Recent Results (from the past 240 hour(s))  Culture, Respiratory w Gram Stain     Status: None   Collection Time: 11/10/20 10:33 AM   Specimen: Tracheal Aspirate; Respiratory  Result Value Ref Range Status   Specimen Description TRACHEAL ASPIRATE  Final    Special Requests Normal  Final   Gram Stain   Final    RARE WBC PRESENT, PREDOMINANTLY MONONUCLEAR NO ORGANISMS SEEN    Culture   Final    RARE Normal respiratory flora-no Staph aureus or Pseudomonas seen Performed at [facility], [address] [place], [place] [address]    Report Status 11/12/2020 FINAL  Final  MRSA PCR Screening     Status: None   Collection Time: 11/10/20  1:03 PM   Specimen: Nasal Mucosa; Nasopharyngeal  Result Value Ref Range Status   MRSA by PCR NEGATIVE NEGATIVE Final    Comment:        The GeneXpert MRSA Assay (FDA approved for NASAL specimens only), is one component  of a comprehensive MRSA colonization surveillance program. It is not intended to diagnose MRSA infection nor to guide or monitor treatment for MRSA infections. Performed at [facility], [place] [address], [place], [place] [address]          Radiology Studies: DG Swallowing [facility]  Result Date: 11/15/2020 Objective Swallowing Evaluation: Type of Study: MBS-Modified Barium Swallow Study  Patient Details Name: COLTEN DESROCHES MRN: [id number] Date of Birth: 1981/01/06 Today's Date: 11/15/2020 Time: SLP Start Time (ACUTE ONLY): 6468 -SLP Stop Time (ACUTE ONLY): 1500 SLP Time Calculation (min) (ACUTE ONLY): 48 min Past Medical History: No past medical history on file. Past Surgical History: No past surgical history on file. HPI: Pt is a 40 y/o male with PMH PTSD, and anxiety. He was admitted 3/2 with two weeks of chest pain.  Pt was emergently intubated 3/2-3/4. EEG suggested moderate, diffuse encephalopathy without seizure activity. Found to be in rhabdo with AKI. Pt reintubated 3/6-3/14 for airway protection due to hypoactive delirium. BSE 3/15: no signs of aspiration or dysphagia. He both passed a 3 oz water swallow with RN and SLP. A clear liquid diet had been started by MD at that time and SLP recommended advancement per critical care. Episode of desaturation after  eating on 3/15, MD suspected silent aspiration. Vasovagal event with ice water. Pt re-intubated 3/17-trach 3/18.  CXR 3/18: Patchy interstitial and airspace opacities persist compatible with multi lobar pneumonia.  Subjective: alert Assessment / Plan / Recommendation CHL IP CLINICAL IMPRESSIONS 11/15/2020 Clinical Impression Pt presented with normal oropharyngeal function across all consistencies. There was adequate mastication of solids, timely swallow trigger, and reliable laryngeal vestibule closure. There was NO ASPIRATION. Pharyngeal stripping was normal and there was no residue post-swallow.  Pt used PMV for most of study - it was removed and he was tested with thin liquids for the last few trials to determine if its absence altered the physiology. He demonstrated one incident of penetration with the valve off.  Recommend beginning a dysphagia 3 diet with thin liquids. Give meds whole in puree. Use valve when eating/drinking to restore cough and benefits for airway protection. D/W pt and RN.  Pt was very happy with results and consumed 8 oz of orange juice after he returned to his room.  SLP will follow briefly to ensure safety/toleration. SLP Visit Diagnosis Dysphagia, oropharyngeal phase (R13.12) Attention and concentration deficit following -- Frontal lobe and executive function deficit following -- Impact on safety and function Mild aspiration risk   CHL IP TREATMENT RECOMMENDATION 11/15/2020 Treatment Recommendations Therapy as outlined in treatment plan below   Prognosis 11/15/2020 Prognosis for Safe Diet Advancement Good Barriers to Reach Goals -- Barriers/Prognosis Comment -- CHL IP DIET RECOMMENDATION 11/15/2020 SLP Diet Recommendations Dysphagia 3 (Mech soft) solids;Thin liquid Liquid Administration via Cup;Straw Medication Administration Whole meds with puree Compensations Minimize environmental distractions Postural Changes --   CHL IP OTHER RECOMMENDATIONS 11/15/2020 Recommended Consults -- Oral Care  Recommendations Oral care BID Other Recommendations Place PMSV during PO intake   CHL IP FOLLOW UP RECOMMENDATIONS 11/15/2020 Follow up Recommendations Inpatient Rehab   CHL IP FREQUENCY AND DURATION 11/15/2020 Speech Therapy Frequency (ACUTE ONLY) min 2x/week Treatment Duration 2 weeks      CHL IP ORAL PHASE 11/15/2020 Oral Phase WFL Oral - Pudding Teaspoon -- Oral - Pudding Cup -- Oral - Honey Teaspoon -- Oral - Honey Cup -- Oral - Nectar Teaspoon -- Oral - Nectar Cup -- Oral - Nectar Straw -- Oral -  Thin Teaspoon -- Oral - Thin Cup -- Oral - Thin Straw -- Oral - Puree -- Oral - Mech Soft -- Oral - Regular -- Oral - Multi-Consistency -- Oral - Pill -- Oral Phase - Comment --  CHL IP PHARYNGEAL PHASE 11/15/2020 Pharyngeal Phase WFL Pharyngeal- Pudding Teaspoon -- Pharyngeal -- Pharyngeal- Pudding Cup -- Pharyngeal -- Pharyngeal- Honey Teaspoon -- Pharyngeal -- Pharyngeal- Honey Cup -- Pharyngeal -- Pharyngeal- Nectar Teaspoon -- Pharyngeal -- Pharyngeal- Nectar Cup -- Pharyngeal -- Pharyngeal- Nectar Straw -- Pharyngeal -- Pharyngeal- Thin Teaspoon -- Pharyngeal -- Pharyngeal- Thin Cup -- Pharyngeal -- Pharyngeal- Thin Straw -- Pharyngeal -- Pharyngeal- Puree -- Pharyngeal -- Pharyngeal- Mechanical Soft -- Pharyngeal -- Pharyngeal- Regular -- Pharyngeal -- Pharyngeal- Multi-consistency -- Pharyngeal -- Pharyngeal- Pill -- Pharyngeal -- Pharyngeal Comment --  CHL IP CERVICAL ESOPHAGEAL PHASE 11/15/2020 Cervical Esophageal Phase WFL Pudding Teaspoon -- Pudding Cup -- Honey Teaspoon -- Honey Cup -- Nectar Teaspoon -- Nectar Cup -- Nectar Straw -- Thin Teaspoon -- Thin Cup -- Thin Straw -- Puree -- Mechanical Soft -- Regular -- Multi-consistency -- Pill -- Cervical Esophageal Comment -- Andrew [name] 11/15/2020, 3:11 PM                   Scheduled Meds: . amLODipine  10 mg Oral Daily  . apixaban  10 mg Oral BID   Followed by  . [START ON 11/22/2020] apixaban  5 mg Oral BID  . carvedilol  25 mg Oral BID  WC  . chlorhexidine gluconate (MEDLINE KIT)  15 mL Mouth Rinse BID  . Chlorhexidine Gluconate Cloth  6 each Topical Q0600  . cloNIDine  0.1 mg Oral TID  . docusate sodium  100 mg Oral BID  . feeding supplement  237 mL Oral BID BM  . hydrALAZINE  50 mg Oral Q6H  . mouth rinse  15 mL Mouth Rinse 10 times per day  . multivitamin with minerals  1 tablet Oral Daily  . pantoprazole  40 mg Oral QHS  . polyethylene glycol  17 g Oral Daily  . prazosin  4 mg Oral QHS  . senna  1 tablet Oral BID  . sertraline  50 mg Oral QHS  . sodium chloride flush  10-40 mL Intracatheter Q12H  . sodium chloride flush  10-40 mL Intracatheter Q12H  . vitamin B-12  1,000 mcg Oral Daily   Continuous Infusions: . sodium chloride Stopped (11/08/20 1130)     LOS: 21 days    Time spent: 39 minutes spent on chart review, discussion with nursing staff, consultants, updating family and interview/physical exam; more than 50% of that time was spent in counseling and/or coordination of care.    Aliahna Statzer J [place] DO [facility] Available via Epic secure chat 7am-7pm After these hours, please refer to coverage provider listed on [email or web address] 11/17/2020, 11:28 AM

## 2020-11-17 NOTE — Progress Notes (Signed)
Inpatient Rehabilitation Admissions Coordinator  I contacted pt's sister, Mick Sell, by phone. She and I will meet at pt's bedside tomorrow at 10 am to discuss with patient his options for rehab venue.  Ottie Glazier, RN, MSN Rehab Admissions Coordinator [phone] 11/17/2020 4:36 PM

## 2020-11-17 NOTE — Progress Notes (Signed)
NAME:  LENNIE VASCO, MRN:  [id number], DOB:  [birth year], LOS: 21 ADMISSION DATE:  10/26/2020, CONSULTATION DATE:  10/27/2020 REFERRING MD:  ER CHIEF COMPLAINT: Respiratory Failure  Brief History   Mr. Dobbs is a 40 y/o male admitted on 3/2 with chest pain & acute hyperactive delirium requiring mechanical ventilation. Subsequently found to be in rhabdo with AKI requiring HD. Concern Wellbutrin, Seroquel contributing. UDS negative x2, CSF negative, MRI negative. Extubated on 3/14 and re-intubated for the third time 3/16. Tracheostomy on 3/18.   Past Medical History  PTSD Depression  Significant Hospital Events   3/04 extubated, AKI worse, found to be in rhabdo 3/05 Continues to have diffuse muscle pain, renal consult 3/06 increased body pain particularly right shoulder, concern for septic arthritis, MRI neg 3/13 Abd distention, cuff leak, bloody drainage around ETT  3/14 Weaning on PSV, remains on high dose sedation, extubated, delirium overnight on dex 3/15 Episode of desaturation after eating, suspected silent aspiration. Vasovagal event with ice water 3/16 Re-intubated, remains on esmolol gtt / resistant HTN 3/18 Tracheostomy  Consults:  Renal Psych Neuro  Procedures:  ETT 3/3  >> 3/4 ETT 3/6 >> 3/14 HD catheter 3/9 >> 3/15 ETT 3/16 >> 3/18 Trach 3/18 >>  Significant Diagnostic Tests:   Head CT 3/3 >> sphenoid sinusitis  Echo 3/3 >> nml LVEF, no WMA  MRI right shoulder 3/6 >> no evidence of septic joint, no effusion, severe edema and muscles around shoulder joint consistent with rhabdo  Head CT 3/6 >> neg  CSF cell count 3/7 >> WBC 2, RBC 2 (hypocellular)  MRI brain 3/7 >> normal, paranasal sinus mucosal thickening, small mastoid effusions  BLLE Ultrasound 3/14  >> BL DVT gastrocnemius  Limited ECHO 3/15 >> EF 60, no evidence of RV strain  Chest CT 3/17 >> Severe multilobar bilateral bronchopneumonia, trace R pleural effusion   Micro Data:  COVID 3/3 >>  negative Influenza 3/3 >> negative Resp 3/3 >> nml flora Blood 3/3 >> negative Blood 3/6 >> negative CSF 3/7 >> negative  Fecal lactoferrin 3/11 >> negative UA 3/14 >> +Ketones, Neg MRSA swab 3/17 >> negative Trach asp 3/17 >> negative  Antimicrobials:  Ceftriaxone 3/3 >> 3/6 Azithromycin 3/3 x 1 Cefazolin 3/6 >> 3/7 Ceftriaxone 3/7 >> 3/9 Vanc 3/6  X 1 dose Vanc 3/7 >> 3/8 Ceftriaxone 3/13 >> 3/15 Cefepime 3/16 >> 3/22  Interim history/subjective:  Patient is started trickle bleed from tracheostomy site, he was injected with lidocaine/epinephrine combination followed by thrombin pad that helped to stop bleeding  Objective   Blood pressure 106/79, pulse (!) 132, temperature 98.9 F (37.2 C), temperature source Oral, resp. rate (!) 32, height 5\' 10"  (1.778 m), weight 95.4 kg, SpO2 97 %.    FiO2 (%):  [21 %] 21 %   Intake/Output Summary (Last 24 hours) at 11/17/2020 1010 Last data filed at 11/17/2020 0749 Gross per 24 hour  Intake 460 ml  Output 2550 ml  Net -2090 ml   Filed Weights   11/15/20 0400 11/16/20 0500 11/17/20 0500  Weight: 92.2 kg 93.3 kg 95.4 kg    Examination: General: Middle-age African-American male, lying in the bed HEENT: NCAT. MMM. Trach in place Lungs: Clear to auscultation bilaterally, no wheezes or rhonchi Cardiovascular: Tachycardic. Regular rhythm. No m/g/r Abdomen: soft, NT. Mild distention. Hypoactive BS.  Extremities: Well-perfused. Normal ROM Skin: Warm and dry. No obvious rashes or lesions Neuro: Alert and Awake. Follows commands and spontaneously moves all extremities.   [facility]  Problem list   AGMA AKI, rhabdo, improved Hypernatremia Hyperphosphatemia  Assessment & Plan:    Acute Hypoxemic Respiratory Failure Requiring Mechanical Ventilation  (Failed extubation x 2, re-intubated on 3/16) s/p tracheostomy On trach collar Continue routine trach care He was bleeding from tracheostomy site, it was injected with  lidocaine/epinephrine and thrombin pad applied with improvement  Acute Metabolic Encephalopathy with Hypoactive Delirium, improved Hx of psychiatric disease Patient with acute psychosis on admission. On multiple psychoactive medications including Seroquel and Wellbutrin. UDS and head CT negative on admit, denies history of drug abuse but sister reports suspected use. Work up so far unremarkable. Mental status improved and off vent.  Psychiatric consult is appreciated Patient was restarted on his home meds Continue as needed Klonopin  Multilobar PNA, Aspiration pneumonitis Wedge Shaped Opacity on CXR 3/17 Leukocystosis, resolved Completed therapy with cefepime for 7 days  Bilateral Gastrocnemius DVTs Provoked clot so will likely only need 3 months of anticoagulation. Closely monitor for any worsening respiratory distress.  Subcu Lovenox was switched to apixaban  Hypertension Better controlled now Continue Coreg, Norvasc, clonidine and hydralazine  Anemia 2/2 critical illness H&H is stable  Best practice:  Diet: Dysphagia diet Pain/Anxiety/Delirium protocol (if indicated): Klonopin VAP protocol (if indicated): N/A DVT prophylaxis: Apixaban GI prophylaxis: protonix Glucose control: n/a Mobility: OOB Disposition: Progressive care  Labs   CBC: Recent Labs  Lab 11/11/20 0511 11/12/20 0436 11/14/20 0033 11/16/20 0030  WBC 10.6* 11.9* 10.5 8.6  HGB 8.5* 8.3* 9.6* 8.8*  HCT 27.6* 26.1* 29.9* 27.5*  MCV 100.0 99.2 96.5 96.8  PLT 503* 483* 561* 506*    Basic Metabolic Panel: Recent Labs  Lab 11/14/20 0033 11/14/20 0918 11/14/20 1906 11/15/20 0157 11/16/20 0030 11/17/20 0313  NA 150*  --  146* 147* 141 139  K 3.3*  --  3.9 4.0 3.7 3.5  CL 118*  --  117* 116* 111 109  CO2 24  --  20* 22 22 22   GLUCOSE 131*  --  116* 121* 118* 104*  BUN 20  --  22* 23* 20 17  CREATININE 1.19  --  1.18 1.23 1.22 1.17  CALCIUM 8.6*  --  8.7* 9.1 8.7* 9.0  MG  --  1.9  --   --   --   1.6*   GFR: Estimated Creatinine Clearance: 95.4 mL/min (by C-G formula based on SCr of 1.17 mg/dL). Recent Labs  Lab 11/11/20 0511 11/12/20 0436 11/14/20 0033 11/16/20 0030  PROCALCITON 0.27  --   --   --   WBC 10.6* 11.9* 10.5 8.6    Liver Function Tests: Recent Labs  Lab 11/12/20 0436 11/13/20 0238 11/14/20 0033 11/15/20 0157 11/16/20 0030  AST 27 30 32 60* 58*  ALT 18 22 19 23 23   ALKPHOS 60 69 62 67 57  BILITOT 0.7 0.5 0.5 0.8 0.9  PROT 6.0* 7.0 6.8 7.3 6.9  ALBUMIN 2.8* 3.3* 3.2* 3.4* 3.2*   No results for input(s): LIPASE, AMYLASE in the last 168 hours. No results for input(s): AMMONIA in the last 168 hours.  ABG    Component Value Date/Time   PHART 7.353 11/09/2020 1653   PCO2ART 45.3 11/09/2020 1653   PO2ART 476 (H) 11/09/2020 1653   HCO3 24.9 11/09/2020 1653   TCO2 26 11/09/2020 1653   ACIDBASEDEF 1.0 11/09/2020 1653   O2SAT 100.0 11/09/2020 1653     Coagulation Profile: No results for input(s): INR, PROTIME in the last 168 hours.  Cardiac Enzymes: Recent Labs  Lab 11/14/20 0033 11/17/20 0313  CKTOTAL 560* 1,685*    HbA1C: No results found for: HGBA1C  CBG: Recent Labs  Lab 11/16/20 1523 11/16/20 1931 11/16/20 2332 11/17/20 0330 11/17/20 0736  GLUCAP 124* 112* 108* 90 105*      Cheri Fowler MD [place] [facility] See Amion for pager If no response to pager, please call [phone] until 7pm After 7pm, Please call E-link [phone]

## 2020-11-17 NOTE — Progress Notes (Signed)
Psychiatry Progress Note  11/17/2020 12:09 PM Andrew [name]  MRN:  [id number]   Subjective: No acute overnight events. Elevated at bedside this morning, no family present in the room but RN present.  Patient denies any suicidal or homicidal ideations.  He denies any auditory or visual hallucinations right now.  Last time he he notes he was hallucinating last night but after taking his medication he was able to go to sleep and felt better.  Denies any new complaints and states he is eager to go back to his home.  Principal Problem: Acute on chronic respiratory failure with hypoxia (HCC) Diagnosis: Principal Problem:   Acute on chronic respiratory failure with hypoxia (HCC) Active Problems:   PTSD (post-traumatic stress disorder)   Severe episode of recurrent major depressive disorder, with psychotic features ([place])   Generalized anxiety disorder   AKI (acute kidney injury) ([place])   Encounter for central line placement   Abdominal distention   Tachycardia   Primary hypertension   Ileus ([place])   Acute respiratory failure with hypoxemia (HCC)  Total Time spent with patient: 20 minutes  Past Psychiatric History: PTSD, generalized anxiety disorder, major depressive disorder, hallucination either due to severe PTSD or MDD with psychosis.  He was receiving prazosin 4 mg nightly, bupropion 150 mg daily, quetiapine 400 mg nightly and 1 mg lorazepam nightly from [facility] for PTSD, anxiety, depression.  Past Medical History: History reviewed. No pertinent past medical history. History reviewed. No pertinent surgical history.   Family History: History reviewed. No pertinent family history.   Family Psychiatric  History: Patient father has PTSD  Social History: Patient lives with girlfriend and 25-year-old daughter from other girlfriend.  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   Sleep: Fair  Appetite:  NA  Current Medications: Current  Facility-Administered Medications  Medication Dose Route Frequency Provider Last Rate Last Admin  . 0.9 %  sodium chloride infusion   Intravenous PRN Noemi Chapel P, DO   Paused at 11/08/20 1130  . acetaminophen (TYLENOL) tablet 650 mg  650 mg Oral Q6H PRN Priscella Mann, RPH   650 mg at 11/16/20 1657  . amLODipine (NORVASC) tablet 10 mg  10 mg Oral Daily Sloan Leiter B, RPH   10 mg at 11/17/20 0915  . apixaban (ELIQUIS) tablet 10 mg  10 mg Oral BID Jacky Kindle, MD   10 mg at 11/17/20 0915   Followed by  . [START ON 11/22/2020] apixaban (ELIQUIS) tablet 5 mg  5 mg Oral BID Jacky Kindle, MD      . carvedilol (COREG) tablet 25 mg  25 mg Oral BID WC Sloan Leiter B, RPH   25 mg at 11/17/20 0737  . chlorhexidine gluconate (MEDLINE KIT) (PERIDEX) 0.12 % solution 15 mL  15 mL Mouth Rinse BID Estill Cotta, NP   15 mL at 11/17/20 0746  . Chlorhexidine Gluconate Cloth 2 % PADS 6 each  6 each Topical Q0600 Chesley Mires, MD   6 each at 11/16/20 [phone]  . clonazePAM (KLONOPIN) disintegrating tablet 1 mg  1 mg Oral BID PRN Priscella Mann, RPH   1 mg at 11/16/20 1141  . cloNIDine (CATAPRES) tablet 0.1 mg  0.1 mg Oral TID Priscella Mann, RPH   0.1 mg at 11/17/20 7579  . docusate sodium (COLACE) capsule 100 mg  100 mg Oral BID Priscella Mann, RPH   100 mg at 11/16/20 2119  . feeding supplement (ENSURE ENLIVE /  ENSURE PLUS) liquid 237 mL  237 mL Oral BID BM [place] Eric J, DO   237 mL at 11/17/20 0920  . haloperidol lactate (HALDOL) injection 5 mg  5 mg Intravenous Q4H PRN Jacky Kindle, MD   5 mg at 11/16/20 0158  . hydrALAZINE (APRESOLINE) injection 10-20 mg  10-20 mg Intravenous Q4H PRN Stretch, Marily Lente, MD   20 mg at 11/13/20 0434  . hydrALAZINE (APRESOLINE) tablet 50 mg  50 mg Oral Q6H Millen, Jessica B, RPH   50 mg at 11/17/20 1124  . HYDROmorphone (DILAUDID) injection 0.5-1 mg  0.5-1 mg Intravenous Q2H PRN Lacinda Axon, MD   0.5 mg at 11/15/20 1241  . MEDLINE mouth rinse  15  mL Mouth Rinse 10 times per day Estill Cotta, NP   15 mL at 11/17/20 1028  . multivitamin with minerals tablet 1 tablet  1 tablet Oral Daily [place] Eric J, DO   1 tablet at 11/17/20 0915  . ondansetron (ZOFRAN) injection 4 mg  4 mg Intravenous Q6H PRN Deland Pretty, MD   4 mg at 11/16/20 1003  . pantoprazole (PROTONIX) EC tablet 40 mg  40 mg Oral QHS Sloan Leiter B, RPH   40 mg at 11/16/20 2118  . polyethylene glycol (MIRALAX / GLYCOLAX) packet 17 g  17 g Oral Daily Millen, Jessica B, RPH      . polyethylene glycol (MIRALAX / GLYCOLAX) packet 17 g  17 g Oral Daily PRN Priscella Mann, RPH      . prazosin (MINIPRESS) capsule 4 mg  4 mg Oral QHS Sloan Leiter B, RPH   4 mg at 11/16/20 2108  . senna (SENOKOT) tablet 8.6 mg  1 tablet Oral BID Priscella Mann, RPH   8.6 mg at 11/16/20 2119  . sertraline (ZOLOFT) tablet 50 mg  50 mg Oral QHS Dagar, Meredith Staggers, MD   50 mg at 11/16/20 2118  . sodium chloride flush (NS) 0.9 % injection 10-40 mL  10-40 mL Intracatheter Q12H Chesley Mires, MD   10 mL at 11/16/20 2127  . sodium chloride flush (NS) 0.9 % injection 10-40 mL  10-40 mL Intracatheter PRN Chesley Mires, MD      . sodium chloride flush (NS) 0.9 % injection 10-40 mL  10-40 mL Intracatheter Q12H Audria Nine, DO   10 mL at 11/17/20 0924  . sodium chloride flush (NS) 0.9 % injection 10-40 mL  10-40 mL Intracatheter PRN Audria Nine, DO      . vitamin B-12 (CYANOCOBALAMIN) tablet 1,000 mcg  1,000 mcg Oral Daily Priscella Mann, RPH   1,000 mcg at 11/17/20 0914    Lab Results:  CBC Latest Ref Rng & Units 11/16/2020 11/14/2020 11/12/2020  WBC 4.0 - 10.5 K/uL 8.6 10.5 11.9(H)  Hemoglobin 13.0 - 17.0 g/dL 8.8(L) 9.6(L) 8.3(L)  Hematocrit 39.0 - 52.0 % 27.5(L) 29.9(L) 26.1(L)  Platelets 150 - 400 K/uL 506(H) 561(H) 483(H)   BMP Latest Ref Rng & Units 11/17/2020 11/16/2020 11/15/2020  Glucose 70 - 99 mg/dL 104(H) 118(H) 121(H)  BUN 6 - 20 mg/dL 17 20 23(H)  Creatinine 0.61 -  1.24 mg/dL 1.17 1.22 1.23  Sodium 135 - 145 mmol/L 139 141 147(H)  Potassium 3.5 - 5.1 mmol/L 3.5 3.7 4.0  Chloride 98 - 111 mmol/L 109 111 116(H)  CO2 22 - 32 mmol/L _0 Calcium 8.9 - 10.3 mg/dL 9.0 8.7(L) 9.1    Blood Alcohol level:  Lab Results  Component Value Date  ETH <10 65/78/4696    Metabolic Disorder Labs: No results found for: HGBA1C, MPG No results found for: PROLACTIN Lab Results  Component Value Date   TRIG 90 11/08/2020   Musculoskeletal: Strength & Muscle Tone: within normal limits Gait & Station: Deferred Patient leans: N/A   Psychiatric Specialty Exam: Physical Exam Vitals and nursing note reviewed.  Constitutional:      General: He is not in acute distress. HENT:     Head: Normocephalic and atraumatic.  Cardiovascular:     Rate and Rhythm: Tachycardia present.  Pulmonary:     Effort: Pulmonary effort is normal.  Abdominal:     Palpations: Abdomen is soft.  Neurological:     Mental Status: He is alert and oriented to person, place, and time.     Review of Systems  Blood pressure 116/86, pulse (!) 109, temperature 98.6 F (37 C), temperature source Oral, resp. rate (!) 32, height _0  (1.778 m), weight 95.4 kg, SpO2 100 %.Body mass index is 30.18 kg/m.  General Appearance: Casual  Eye Contact:  Good  Speech:  Normal Rate  Volume:  Normal  Mood:  Anxious  Affect:  Restricted  Thought Process:  Coherent  Orientation:  Full (Time, Place, and Person)  Thought Content:  Obsessions and Rumination  Suicidal Thoughts:  No  Homicidal Thoughts:  No  Memory:  Immediate;   Fair Recent;   Fair Remote;   Fair  Judgement:  Fair  Insight:  Fair  Psychomotor Activity:  Normal  Concentration:  Concentration: Good and Attention Span: Good  Recall:  [place] of Knowledge:  Good  Language:  Good  Akathisia:  No  Handed:  Right  AIMS (if indicated):     Assets:  Communication Skills Desire for Improvement Financial  Resources/Insurance Housing Resilience Social Support  ADL's:  Impaired  Cognition:  WNL  Sleep:      Assessment: 40 year old male with past psychiatric history of major depressive disorder, generalized anxiety disorder, PTSD, ?psychosis either due to PTSD or MDD with psychosis admitted for acute respiratory failure, rhabdomyolysis and acute hyperactive delirium. -On evaluation today patient denies any suicidal or homicidal ideations.  He does not seem like responding to internal stimuli and no thought blocking noted. -Prior to hospitalization he was on prazosin 4 mg nightly daily, bupropion 150 mg daily, quetiapine 400 mg nightly, lorazepam 1 mg nightly.  Yesterday we started him on Zoloft to target his anxiety and depression and discontinued Wellbutrin.  RN reported that patient did well after receiving his psychiatric medication we suggest to give his medication~8pm or earlier.  His ongoing hallucinations can be because of his severe PTSD or it can be MDD with psychosis, unable to clarify at this point. -CK level today is 1685, increasing; EKG pending   Treatment Plan: --Continue Zoloft 50 mg for anxiety and depression --Continue prazosin 4 mg nightly to address PTSD --Do not start Seroquel until CK levels are normal --Plan communicated to Dr. [place] in person --Patient is not an imminent risk to self or others.  He does not meet criteria for inpatient psychiatric hospitalization --Psychiatry is signing off but will be available for any further questions  Honor Junes, MD 11/17/2020, 12:09 PM  PGY-1, Resident

## 2020-11-18 ENCOUNTER — Encounter (HOSPITAL_COMMUNITY): Payer: Self-pay | Admitting: Pulmonary Disease

## 2020-11-18 LAB — CBC
HCT: 28.9 % — ABNORMAL LOW (ref 39.0–52.0)
Hemoglobin: 9.3 g/dL — ABNORMAL LOW (ref 13.0–17.0)
MCH: 30.4 pg (ref 26.0–34.0)
MCHC: 32.2 g/dL (ref 30.0–36.0)
MCV: 94.4 fL (ref 80.0–100.0)
Platelets: 563 10*3/uL — ABNORMAL HIGH (ref 150–400)
RBC: 3.06 MIL/uL — ABNORMAL LOW (ref 4.22–5.81)
RDW: 12.8 % (ref 11.5–15.5)
WBC: 7.5 10*3/uL (ref 4.0–10.5)
nRBC: 0 % (ref 0.0–0.2)

## 2020-11-18 LAB — BASIC METABOLIC PANEL
Anion gap: 10 (ref 5–15)
BUN: 15 mg/dL (ref 6–20)
CO2: 24 mmol/L (ref 22–32)
Calcium: 9.3 mg/dL (ref 8.9–10.3)
Chloride: 104 mmol/L (ref 98–111)
Creatinine, Ser: 1.14 mg/dL (ref 0.61–1.24)
GFR, Estimated: >60 mL/min (ref >60)
Glucose, Bld: 113 mg/dL — ABNORMAL HIGH (ref 70–99)
Potassium: 3.9 mmol/L (ref 3.5–5.1)
Sodium: 138 mmol/L (ref 135–145)

## 2020-11-18 LAB — MAGNESIUM: Magnesium: 1.8 mg/dL (ref 1.7–2.4)

## 2020-11-18 LAB — GLUCOSE, CAPILLARY
Glucose-Capillary: 106 mg/dL — ABNORMAL HIGH (ref 70–99)
Glucose-Capillary: 133 mg/dL — ABNORMAL HIGH (ref 70–99)
Glucose-Capillary: 123 mg/dL — ABNORMAL HIGH (ref 70–99)

## 2020-11-18 MED ORDER — TRAZODONE HCL 50 MG PO TABS
50.0000 mg | ORAL_TABLET | Freq: Two times a day (BID) | ORAL | Status: DC
  Administered 2020-11-18 – 2020-11-19 (×4): 50 mg via ORAL
  Filled 2020-11-18 (×5): qty 1

## 2020-11-18 MED ORDER — CARBAMAZEPINE 200 MG PO TABS
200.0000 mg | ORAL_TABLET | Freq: Three times a day (TID) | ORAL | Status: DC
  Administered 2020-11-18: 200 mg via ORAL
  Filled 2020-11-18: qty 1

## 2020-11-18 MED ORDER — ORAL CARE MOUTH RINSE
15.0000 mL | Freq: Two times a day (BID) | OROMUCOSAL | Status: DC
  Administered 2020-11-18 – 2020-11-23 (×8): 15 mL via OROMUCOSAL

## 2020-11-18 MED ORDER — CHLORHEXIDINE GLUCONATE 0.12 % MT SOLN
15.0000 mL | Freq: Two times a day (BID) | OROMUCOSAL | Status: DC
  Administered 2020-11-19 – 2020-11-24 (×8): 15 mL via OROMUCOSAL
  Filled 2020-11-18 (×7): qty 15

## 2020-11-18 MED ORDER — CARBAMAZEPINE 200 MG PO TABS: 200.0000 mg | ORAL_TABLET | Freq: Two times a day (BID) | ORAL | Status: DC

## 2020-11-18 NOTE — H&P (Incomplete)
Physical Medicine and Rehabilitation Admission H&P    Chief Complaint  Patient presents with  . Chest Pain  : HPI: Andrew [name] is a 40 year old right-handed male with history of PTSD, generalized anxiety, major depressive disorder maintained on Seroquel as well as Wellbutrin.  Per chart review patient lives with significant other.  Independent prior to admission.  1 level home 8 steps to entry.  He has a sister in the area.  Presented 10/26/2020 with reported acute hyperactive delirium.  He was tachycardic 180 bpm with a temperature of 102.5.  He required intubation for airway protection.  Cranial CT scan negative.  Admission chemistries sodium 133, glucose 138, creatinine 0.99 WBC 10,900, troponin initially 3 trending up to [phone], alcohol negative, CK 32,777, lactic acid greater than 11, urine drug screen negative, ammonia level 51.  Echocardiogram with ejection fraction 55 to 60%.  Left ventricle showing no wall motion abnormalities.  Patient's elevated troponin was felt to be related to demand ischemia and no further work-up indicated.  EEG suggestive of moderate diffuse encephalopathy without seizure activity.  His hospital course complicated by development of oliguric AKI rhabdo has noted CK elevated to 32,000 and started on IV bicarb 10/28/2020 and nephrology consulted.  He did require dialysis with steady improvement in renal function with hemodialysis discontinued nephrology signed off 11/09/2020 with latest creatinine 1.14.  Multiple attempts at extubation failed underwent tracheostomy 11/11/2020 per critical care Dr. Denese Killings.  Neurology as well as psychiatry was consulted for patient's hyperactive delirium and MRI completed showing normal appearance of the brain.  Patient's mental status delirium has much improved currently maintained on Zoloft scheduled as well as as needed Klonopin.  Vascular studies lower extremities 11/07/2020 showed findings consistent with acute DVT involving one  of the paired right gastrocnemius veins as well as left gastrocnemius vein and patient was placed on Eliquis.  He completed a course of 7-day antibiotic cefepime for multilobar pneumonia/aspiration pneumonitis and is presently tolerating a mechanical soft thin liquid diet.  Anemia of chronic disease with latest hemoglobin 9.3.  Therapy evaluations completed due to patient decreased functional ability cognitive deficits was admitted for a comprehensive rehab program.  Review of Systems  Constitutional: Positive for fever. Negative for chills and malaise/fatigue.  HENT: Negative for hearing loss.   Eyes: Negative for blurred vision and double vision.  Respiratory: Negative for cough and shortness of breath.   Cardiovascular: Negative for chest pain, palpitations and leg swelling.  Gastrointestinal: Positive for constipation. Negative for heartburn, nausea and vomiting.  Genitourinary: Negative for dysuria, flank pain and hematuria.  Skin: Negative for rash.  Psychiatric/Behavioral: Positive for depression. The patient has insomnia.        Anxiety/PTSD  All other systems reviewed and are negative.  History reviewed. No pertinent past medical history. History reviewed. No pertinent surgical history. History reviewed. No pertinent family history. Social History:  has no history on file for tobacco use, alcohol use, and drug use. Allergies: No Known Allergies Medications Prior to Admission  Medication Sig Dispense Refill  . buPROPion (WELLBUTRIN) 100 MG tablet Take 100 mg by mouth daily.    . QUEtiapine (SEROQUEL) 400 MG tablet Take 400 mg by mouth at bedtime.      Drug Regimen Review Drug regimen was reviewed and remains appropriate with no significant issues identified  Home: Home Living Family/patient expects to be discharged to:: Private residence Living Arrangements: Spouse/significant other Available Help at Discharge: Available 24 hours/day,Friend(s) Type of Home: House Home  Access:  Stairs to enter [employer] of Steps: 8 Home Layout: One level Bathroom Shower/Tub: Engineer, manufacturing systems: Standard Home Equipment: None   Functional History: Prior Function Level of Independence: Independent Comments: no falls in the last year, disabled veteran  Functional Status:  Mobility: Bed Mobility Overal bed mobility: Needs Assistance Bed Mobility: Supine to Sit Rolling: Min guard Sidelying to sit: Min guard Supine to sit: Supervision General bed mobility comments: SupervisionA Transfers Overall transfer level: Needs assistance Equipment used: Rolling walker (2 wheeled) Transfers: Sit to/from Stand Sit to Stand: [employer] safety/equipment Stand pivot transfers: Max assist,+2 physical assistance Squat pivot transfers: Mod assist,+2 physical assistance,+2 safety/equipment,From elevated surface General transfer comment: min assist for stability upon standing; pt with reports of dizziness yet denied faint feeling (not orthostatic Ambulation/Gait Ambulation/Gait assistance: Min assist,+2 safety/equipment Gait Distance (Feet): 140 Feet Assistive device: None Gait Pattern/deviations: Step-through pattern,Decreased stride length,Wide base of support,Drifts right/left General Gait Details: Good slower pace related to IV and lines; pt using wide BOS without cues to improve stability Gait velocity interpretation: <1.8 ft/sec, indicate of risk for recurrent falls    ADL: ADL Overall ADL's : Needs assistance/impaired Eating/Feeding: Set up,Sitting Eating/Feeding Details (indicate cue type and reason): drinking from cup Grooming: Min guard,Standing Grooming Details (indicate cue type and reason): x3 mins for washing face, brushging teeth and washing hands- no cues for sequencing Upper Body Bathing: Minimal assistance,Sitting Lower Body Bathing: Maximal assistance,Sitting/lateral leans,Sit to/from stand,Cueing for safety Upper Body Dressing :  Minimal assistance,Sitting Lower Body Dressing: Supervision/safety,Sitting/lateral leans Toilet Transfer: Moderate assistance,+2 for physical assistance,+2 for safety/equipment,Squat-pivot,Requires drop arm Toilet Transfer Details (indicate cue type and reason): modA +2 for stability Toileting- Clothing Manipulation and Hygiene: Total assistance Toileting - Clothing Manipulation Details (indicate cue type and reason): catheter and flexseal in Functional mobility during ADLs: Minimal assistance,+2 for physical assistance,+2 for safety/equipment General ADL Comments: Pt following all commands with multimodal cues. Pt alert with eyes open. Pt limited by feeling dizzy in sitting and standing. Pt BP in sitting 150/103, 130 BPM - getting blood pressure meds from RN. Pt 124/93, 117 BPM  sitting at EOB.  Cognition: Cognition Overall Cognitive Status: Impaired/Different from baseline Orientation Level: Oriented X4 Cognition Arousal/Alertness: Awake/alert Behavior During Therapy: Flat affect Overall Cognitive Status: Impaired/Different from baseline Area of Impairment: Following commands,Safety/judgement Orientation Level:  (NT) Current Attention Level: Selective Following Commands: Follows one step commands consistently Safety/Judgement: Decreased awareness of safety,Decreased awareness of deficits Awareness: Intellectual Problem Solving: Slow processing,Requires verbal cues,Requires tactile cues General Comments: Showed good awareness of multiple lines; not impulsive this date--patiently waiting for lines to be prepared Difficult to assess due to: Tracheostomy  Physical Exam: Blood pressure (!) 129/91, pulse (!) 122, temperature 99.3 F (37.4 C), temperature source Oral, resp. rate (!) 22, height 5\' 10"  (1.778 m), weight 91.2 kg, SpO2 99 %. Physical Exam Neck:     Comments: Patient has a #6 Shiley tracheostomy tube in place Neurological:     Comments: Patient is sitting up in chair on the  phone in no acute distress.  Follows commands.  Makes eye contact with examiner.  He is able to speak around his tracheostomy.  Provides name and age.     Results for orders placed or performed during the hospital encounter of 10/26/20 (from the past 48 hour(s))  Glucose, capillary     Status: Abnormal   Collection Time: 11/16/20 10:59 AM  Result Value Ref Range   Glucose-Capillary 107 (H) 70 - 99 mg/dL  Comment: Glucose reference range applies only to samples taken after fasting for at least 8 hours.  Glucose, capillary     Status: Abnormal   Collection Time: 11/16/20  3:23 PM  Result Value Ref Range   Glucose-Capillary 124 (H) 70 - 99 mg/dL    Comment: Glucose reference range applies only to samples taken after fasting for at least 8 hours.  Glucose, capillary     Status: Abnormal   Collection Time: 11/16/20  7:31 PM  Result Value Ref Range   Glucose-Capillary 112 (H) 70 - 99 mg/dL    Comment: Glucose reference range applies only to samples taken after fasting for at least 8 hours.  Glucose, capillary     Status: Abnormal   Collection Time: 11/16/20 11:32 PM  Result Value Ref Range   Glucose-Capillary 108 (H) 70 - 99 mg/dL    Comment: Glucose reference range applies only to samples taken after fasting for at least 8 hours.  CK     Status: Abnormal   Collection Time: 11/17/20  3:13 AM  Result Value Ref Range   Total CK 1,685 (H) 49 - 397 U/L    Comment: Performed at [facility], [address] [place], [place] [address]  Basic metabolic panel     Status: Abnormal   Collection Time: 11/17/20  3:13 AM  Result Value Ref Range   Sodium 139 135 - 145 mmol/L   Potassium 3.5 3.5 - 5.1 mmol/L   Chloride 109 98 - 111 mmol/L   CO2 22 22 - 32 mmol/L   Glucose, Bld 104 (H) 70 - 99 mg/dL    Comment: Glucose reference range applies only to samples taken after fasting for at least 8 hours.   BUN 17 6 - 20 mg/dL   Creatinine, Ser 1.88 0.61 - 1.24 mg/dL   Calcium 9.0 8.9 - 41.6  mg/dL   GFR, Estimated >60 >63 mL/min    Comment: (NOTE) Calculated using the CKD-EPI Creatinine Equation (2021)    Anion gap 8 5 - 15    Comment: Performed at [facility], [address] [place], [place] [address]  Magnesium     Status: Abnormal   Collection Time: 11/17/20  3:13 AM  Result Value Ref Range   Magnesium 1.6 (L) 1.7 - 2.4 mg/dL    Comment: Performed at [facility], [address], [place], [place] [address]  Glucose, capillary     Status: None   Collection Time: 11/17/20  3:30 AM  Result Value Ref Range   Glucose-Capillary 90 70 - 99 mg/dL    Comment: Glucose reference range applies only to samples taken after fasting for at least 8 hours.  Glucose, capillary     Status: Abnormal   Collection Time: 11/17/20  7:36 AM  Result Value Ref Range   Glucose-Capillary 105 (H) 70 - 99 mg/dL    Comment: Glucose reference range applies only to samples taken after fasting for at least 8 hours.  Glucose, capillary     Status: Abnormal   Collection Time: 11/17/20 11:19 AM  Result Value Ref Range   Glucose-Capillary 115 (H) 70 - 99 mg/dL    Comment: Glucose reference range applies only to samples taken after fasting for at least 8 hours.  Glucose, capillary     Status: Abnormal   Collection Time: 11/17/20  3:12 PM  Result Value Ref Range   Glucose-Capillary 103 (H) 70 - 99 mg/dL    Comment: Glucose reference  range applies only to samples taken after fasting for at least 8 hours.  Glucose, capillary     Status: Abnormal   Collection Time: 11/17/20  7:40 PM  Result Value Ref Range   Glucose-Capillary 120 (H) 70 - 99 mg/dL    Comment: Glucose reference range applies only to samples taken after fasting for at least 8 hours.  Glucose, capillary     Status: Abnormal   Collection Time: 11/17/20 11:18 PM  Result Value Ref Range   Glucose-Capillary 108 (H) 70 - 99 mg/dL    Comment: Glucose reference range applies only to samples taken after fasting for at least 8 hours.   CBC     Status: Abnormal   Collection Time: 11/18/20  1:22 AM  Result Value Ref Range   WBC 7.5 4.0 - 10.5 K/uL   RBC 3.06 (L) 4.22 - 5.81 MIL/uL   Hemoglobin 9.3 (L) 13.0 - 17.0 g/dL   HCT 11.928.9 (L) 14.739.0 - 82.952.0 %   MCV 94.4 80.0 - 100.0 fL   MCH 30.4 26.0 - 34.0 pg   MCHC 32.2 30.0 - 36.0 g/dL   RDW 56.212.8 13.011.5 - 86.515.5 %   Platelets 563 (H) 150 - 400 K/uL   nRBC 0.0 0.0 - 0.2 %    Comment: Performed at [facility], [address] [place], [place] [address]  Basic metabolic panel     Status: Abnormal   Collection Time: 11/18/20  1:22 AM  Result Value Ref Range   Sodium 138 135 - 145 mmol/L   Potassium 3.9 3.5 - 5.1 mmol/L   Chloride 104 98 - 111 mmol/L   CO2 24 22 - 32 mmol/L   Glucose, Bld 113 (H) 70 - 99 mg/dL    Comment: Glucose reference range applies only to samples taken after fasting for at least 8 hours.   BUN 15 6 - 20 mg/dL   Creatinine, Ser 6.291.14 0.61 - 1.24 mg/dL   Calcium 9.3 8.9 - 52.810.3 mg/dL   GFR, Estimated >41>60 >32>60 mL/min    Comment: (NOTE) Calculated using the CKD-EPI Creatinine Equation (2021)    Anion gap 10 5 - 15    Comment: Performed at [facility], [address] [place], [place] [address]  Magnesium     Status: None   Collection Time: 11/18/20  1:22 AM  Result Value Ref Range   Magnesium 1.8 1.7 - 2.4 mg/dL    Comment: Performed at [facility], [address] [place], [place] [address]  Glucose, capillary     Status: Abnormal   Collection Time: 11/18/20  3:21 AM  Result Value Ref Range   Glucose-Capillary 106 (H) 70 - 99 mg/dL    Comment: Glucose reference range applies only to samples taken after fasting for at least 8 hours.  Glucose, capillary     Status: Abnormal   Collection Time: 11/18/20  8:01 AM  Result Value Ref Range   Glucose-Capillary 133 (H) 70 - 99 mg/dL    Comment: Glucose reference range applies only to samples taken after fasting for at least 8 hours.   No results found.     Medical Problem List and  Plan: 1.  Acute metabolic encephalopathy secondary to hyperactive delirium/PTSD  -patient may *** shower  -ELOS/Goals: *** 2.  Antithrombotics: DVT involving one of the paired right gastrocnemius veins as well as DVT involving left gastrocnemius vein. -DVT/anticoagulation: Eliquis  -antiplatelet therapy: N/A 3. Pain Management: Tylenol as needed 4.  Mood: Zoloft 50 mg daily, trazodone 50 mg twice daily, Klonopin 1 mg twice daily as needed.  Follow-up psychiatry services  -antipsychotic agents: N/A 5. Neuropsych: This patient he has not capable of making decisions on his own behalf. 6. Skin/Wound Care: Routine skin checks 7. Fluids/Electrolytes/Nutrition: Routine in and outs with follow-up chemistries 8.  Acute hypoxic respiratory failure.  Status post tracheostomy 11/11/2020 #6 Shiley.  Follow-up critical care medicine. 9.  Acute renal failure/rhabdomyolysis.  Initial hemodialysis discontinued and renal service signed off.  Latest CK 1685 10.  Hypertension.  Norvasc 10 mg daily, Coreg 25 mg twice daily, clonidine 0.1 mg 3 times daily, hydralazine 50 mg every 6 hours, Minipress 4 mg nightly.  Monitor with increased mobility 11.  Anemia secondary to critical illness.  Follow-up CBC ***  Charlton Amor, PA-C 11/18/2020

## 2020-11-18 NOTE — Progress Notes (Signed)
Physical Therapy Treatment Patient Details Name: Andrew [name] MRN: [id number] DOB: 06-02-1981 Today's Date: 11/18/2020    History of Present Illness 39y/o male admitted 3/2 with two weeks of chest pain.  Pt was emergently intubated 3/2- 3/4. EEG suggested moderate, diffuse encephalopathy without seizure activity. Found to be in rhabdo with AKI. RUE pain--MRI shoulder:Edema in musculature about the shoulder. reintubated 3/6 due to hypoactive delirium for airway protection; 3/7 lumbar puncture. Extubation 3/14. Re-intubated 3/17.  Trach 3/18.  PMhx: PTSD, anxiety.    PT Comments    Pt making steady progress with mobility, cognition and balance. Continue to recommend CIR for further therapy to maximize independence prior to return home.    Follow Up Recommendations  CIR     Equipment Recommendations  None recommended by PT    Recommendations for Other Services       Precautions / Restrictions Precautions Precautions: Fall Restrictions Weight Bearing Restrictions: No    Mobility  Bed Mobility Overal bed mobility: Needs Assistance Bed Mobility: Supine to Sit     Supine to sit: Supervision     General bed mobility comments: supervision for safety    Transfers Overall transfer level: Needs assistance Equipment used: None Transfers: Sit to/from Stand Sit to Stand: Min assist         General transfer comment: Assist for stability on rising  Ambulation/Gait Ambulation/Gait assistance: Min assist Gait Distance (Feet): 150 Feet (x 2) Assistive device: None Gait Pattern/deviations: Step-through pattern;Decreased stride length;Wide base of support;Drifts right/left Gait velocity: decr Gait velocity interpretation: 1.31 - 2.62 ft/sec, indicative of limited community ambulator General Gait Details: Assist for balance. Uses wide base to incr stability.   Stairs             Wheelchair Mobility    Modified Rankin (Stroke Patients Only)       Balance  Overall balance assessment: Needs assistance Sitting-balance support: Feet supported;No upper extremity supported Sitting balance-Leahy Scale: Good     Standing balance support: No upper extremity supported;During functional activity Standing balance-Leahy Scale: Fair                              Cognition Arousal/Alertness: Awake/alert Behavior During Therapy: Flat affect Overall Cognitive Status: Impaired/Different from baseline Area of Impairment: Following commands;Safety/judgement;Problem solving                       Following Commands: Follows one step commands consistently Safety/Judgement: Decreased awareness of safety;Decreased awareness of deficits   Problem Solving: Slow processing General Comments: Not impulsive this date      Exercises      General Comments General comments (skin integrity, edema, etc.): Pt on 28% trach collar with SpO2 98% at rest. Amb on RA with SpO2 98-100%. RHR 109. HR with amb 119.      Pertinent Vitals/Pain Pain Assessment: Faces Faces Pain Scale: No hurt    Home Living                      Prior Function            PT Goals (current goals can now be found in the care plan section) Progress towards PT goals: Progressing toward goals    Frequency    Min 3X/week      PT Plan Current plan remains appropriate    Co-evaluation PT/OT/SLP Co-Evaluation/Treatment: Yes  AM-PAC PT "6 Clicks" Mobility   Outcome Measure  Help needed turning from your back to your side while in a flat bed without using bedrails?: A Little Help needed moving from lying on your back to sitting on the side of a flat bed without using bedrails?: A Little Help needed moving to and from a bed to a chair (including a wheelchair)?: A Little Help needed standing up from a chair using your arms (e.g., wheelchair or bedside chair)?: A Little Help needed to walk in hospital room?: A Little Help needed climbing 3-5  steps with a railing? : A Lot 6 Click Score: 17    End of Session Equipment Utilized During Treatment: Gait belt Activity Tolerance: Patient tolerated treatment well Patient left: with call bell/phone within reach;in chair;with chair alarm set Nurse Communication: Mobility status PT Visit Diagnosis: Other abnormalities of gait and mobility (R26.89);Muscle weakness (generalized) (M62.81);Pain     Time: 0814-4818 PT Time Calculation (min) (ACUTE ONLY): 20 min  Charges:  $Gait Training: 8-22 mins                     [facility] PT Acute Rehabilitation Services Pager [phone] Office [phone]    Angelina Ok [facility] 11/18/2020, 10:29 AM

## 2020-11-18 NOTE — Progress Notes (Signed)
[facility] PROGRESS NOTE  Shelbie ProctorCornell J Nishiyama ZOX:[id number] DOB: 06/18/1981 DOA: 10/26/2020 PCP: Agustina CaroliHicks, Kristin D, MD  Status: Remains inpatient appropriate because:Unsafe d/c plan and Inpatient level of care appropriate due to severity of illness   Dispo:  Patient From: Home  Planned Disposition: Inpatient Rehab  Medically stable for discharge: Yes  BARRIERS to DC: Insurance authorization for [employer].  It was not determined until greater than 72 hours after patient presented that [facility].             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  During ED evaluation he became combative, refusing care and notably tachycardic with heart rates of 280 with a temperature of 102.5.  Patient was emergently intubated for airway protection and admitted to the PCCM service.  Patient was found to have rhabdomyolysis, acute renal failure requiring HD.  There was concern for Wellbutrin and Seroquel contributing to this.  Several attempts at extubation were made that were unsuccessful and ultimately patient underwent tracheostomy on 11/11/2020.  [facility]  Subjective: Patient awake and alert.  States he did not eat breakfast this morning because he was not hungry.  Continues to have issues with insomnia in context of his underlying PTSD.  Objective: Vitals:   11/18/20 0739 11/18/20 0802  BP: 135/88   Pulse: (!) 122   Resp: (!) 35   Temp:  99.3 F (37.4 C)  SpO2: 99%     Intake/Output Summary (Last 24 hours) at 11/18/2020 0846 Last data filed at 11/18/2020 0600 Gross per 24 hour  Intake 120 ml  Output 2310  ml  Net -2190 ml   Filed Weights   11/16/20 0500 11/17/20 0500 11/18/20 0329  Weight: 93.3 kg 95.4 kg 91.2 kg    Exam:  Constitutional: NAD, calm, comfortable Respiratory: Trach in place with PMV valve.  Patient able to speak.  Lungs are clear to auscultation.  FiO2 21% with 5 L oxygen Cardiovascular: Normal heart sounds, persistent tachycardia otherwise normotensive. Abdomen: no tenderness, Bowel sounds positive.  Tolerating dysphagia diet. LBM 3/24 Musculoskeletal: no clubbing / cyanosis. No joint deformity upper and lower extremities. Good ROM, no contractures. Normal muscle tone.  Neurologic: CN 2-12 grossly intact. Sensation intact, DTR normal. Strength 5/5 x all 4 extremities.  Psychiatric: Alert and oriented x3.  Affect appropriate.   Assessment/Plan: Acute problems: Acute hypoxemic respiratory failure s/p trach -Presented to the ED with chest pain, became belligerent with elevated heart rate and respiratory distress and was subsequently intubated for airway protection.  Patient failed extubation x2 in the intensive care unit and ultimately had a tracheostomy placed on 11/11/2020. --Respiratory/speech therapy following, currently 5 L trach collar --Seen by PCCM for bleeding from tracheostomy site, injected with lidocaine/epinephrine and thrombin pad applied with resolution -Continue PMV training. -?  Eventual candidate for decannulation  Acute metabolic encephalopathy due to hypoactive delirium: Resolved Hx PTSD, GAD, MDD Patient with acute psychosis on admission.  On multiple psychoactive medications to include Seroquel and Wellbutrin outpatient.  UDS and head CT negative on admission.  Denies history of drug abuse but family suspects possible use.  Initially requiring Precedex infusion, which has  been titrated off. --Psychiatry following, appreciate assistance --Zoloft 50 mg p.o. nightly --Prazosin 4 mg p.o. daily --Klonopin 1 mg p.o. twice daily as needed anxiety --Haldol  as needed agitation *Unable to resume Seroquel until CK normalized-3/25 will begin trazodone 50 mg BID  Acute renal failure Rhabdomyolysis Creatinine 0.99 on admission, trended up to a high of 11.94 during hospitalization. CK high of 41K -down to 1685 on 3/24 -ARF likely secondary to rhabdomyolysis/pigment nephropathy from medications use versus questionable shoulder injury.   -Renal ultrasound shows right kidney normal in appearance, visualization left kidney limited with no abnormality seen and bladder decompressed.   -Nephrology was consulted and patient underwent HD with improvement of renal function.  Hemodialysis was discontinued and nephrology signed off on 11/09/2020. --Cr 0.99>>11.94>>1.22>1.17 --Encouraged continued oral intake --Avoid nephrotoxins, renal dose all medications  Bilateral gastrocnemius DVTs Suspected PE TTE 11/07/2020 with no RV heart strain appreciated.  Normal LVEF without LV regional wall motion abnormalities. -Continue Eliquis  Weakness/deconditioning/debility. --Continue PT/OT efforts while inpatient, currently recommending CIR  Dysphagia -Cortrack now removed, speech therapy following.   -Continue dysphagia 3 diet with thin liquids. -Off tube feedings and no history of diabetes so can discontinue CBG checks with sliding scale insulin.    Other problems: Multilobar pneumonia, aspiration pneumonitis --Completed 7-day course of cefepime  Anemia secondary to critical illness Hemoglobin 8.8, stable. --Vitamin B12 daily  Essential hypertension BP 153/100 --Amlodipine 10 mg p.o. daily --Carvedilol 25 mg p.o. twice daily --Clonidine 0.1 mg p.o. 3 times daily --Hydralazine 50 mg p.o. every 6 hours  Hypomagnesemia Magnesium 1.6, will replete. --Repeat electrolytes in a.m.   Data Reviewed: Basic Metabolic Panel: Recent Labs  Lab 11/14/20 0918 11/14/20 1906 11/15/20 0157 11/16/20 0030 11/17/20 0313 11/18/20 0122  NA  --  146* 147* 141  139 138  K  --  3.9 4.0 3.7 3.5 3.9  CL  --  117* 116* 111 109 104  CO2  --  20* 22 22 22 24   GLUCOSE  --  116* 121* 118* 104* 113*  BUN  --  22* 23* 20 17 15   CREATININE  --  1.18 1.23 1.22 1.17 1.14  CALCIUM  --  8.7* 9.1 8.7* 9.0 9.3  MG 1.9  --   --   --  1.6* 1.8   Liver Function Tests: Recent Labs  Lab 11/12/20 0436 11/13/20 0238 11/14/20 0033 11/15/20 0157 11/16/20 0030  AST 27 30 32 60* 58*  ALT 18 22 19 23 23   ALKPHOS 60 69 62 67 57  BILITOT 0.7 0.5 0.5 0.8 0.9  PROT 6.0* 7.0 6.8 7.3 6.9  ALBUMIN 2.8* 3.3* 3.2* 3.4* 3.2*   No results for input(s): LIPASE, AMYLASE in the last 168 hours. No results for input(s): AMMONIA in the last 168 hours. CBC: Recent Labs  Lab 11/12/20 0436 11/14/20 0033 11/16/20 0030 11/18/20 0122  WBC 11.9* 10.5 8.6 7.5  HGB 8.3* 9.6* 8.8* 9.3*  HCT 26.1* 29.9* 27.5* 28.9*  MCV 99.2 96.5 96.8 94.4  PLT 483* 561* 506* 563*   Cardiac Enzymes: Recent Labs  Lab 11/14/20 0033 11/17/20 0313  CKTOTAL 560* 1,685*   BNP (last 3 results) No results for input(s): BNP in the last 8760 hours.  ProBNP (last 3 results) No results for input(s): PROBNP in the last 8760 hours.  CBG: Recent Labs  Lab 11/17/20 1512 11/17/20 1940 11/17/20 2318 11/18/20 0321 11/18/20 0801  GLUCAP 103* 120* 108* 106* 133*    Recent Results (from the past 240  hour(s))  Culture, Respiratory w Gram Stain     Status: None   Collection Time: 11/10/20 10:33 AM   Specimen: Tracheal Aspirate; Respiratory  Result Value Ref Range Status   Specimen Description TRACHEAL ASPIRATE  Final   Special Requests Normal  Final   Gram Stain   Final    RARE WBC PRESENT, PREDOMINANTLY MONONUCLEAR NO ORGANISMS SEEN    Culture   Final    RARE Normal respiratory flora-no Staph aureus or Pseudomonas seen Performed at [facility], [address] [place], [place] [address]    Report Status 11/12/2020 FINAL  Final  MRSA PCR Screening     Status: None   Collection Time:  11/10/20  1:03 PM   Specimen: Nasal Mucosa; Nasopharyngeal  Result Value Ref Range Status   MRSA by PCR NEGATIVE NEGATIVE Final    Comment:        The GeneXpert MRSA Assay (FDA approved for NASAL specimens only), is one component of a comprehensive MRSA colonization surveillance program. It is not intended to diagnose MRSA infection nor to guide or monitor treatment for MRSA infections. Performed at [facility], [address], [place], [place] [address]      Studies: No results found.  Scheduled Meds: . amLODipine  10 mg Oral Daily  . apixaban  10 mg Oral BID   Followed by  . [START ON 11/22/2020] apixaban  5 mg Oral BID  . carbamazepine  200 mg Oral TID  . carvedilol  25 mg Oral BID WC  . chlorhexidine  15 mL Mouth Rinse BID  . Chlorhexidine Gluconate Cloth  6 each Topical Q0600  . cloNIDine  0.1 mg Oral TID  . docusate sodium  100 mg Oral BID  . feeding supplement  237 mL Oral BID BM  . hydrALAZINE  50 mg Oral Q6H  . mouth rinse  15 mL Mouth Rinse q12n4p  . multivitamin with minerals  1 tablet Oral Daily  . pantoprazole  40 mg Oral QHS  . polyethylene glycol  17 g Oral Daily  . prazosin  4 mg Oral QHS  . senna  1 tablet Oral BID  . sertraline  50 mg Oral QHS  . sodium chloride flush  10-40 mL Intracatheter Q12H  . sodium chloride flush  10-40 mL Intracatheter Q12H  . vitamin B-12  1,000 mcg Oral Daily   Continuous Infusions: . sodium chloride Stopped (11/08/20 1130)    Principal Problem:   Acute on chronic respiratory failure with hypoxia (HCC) Active Problems:   PTSD (post-traumatic stress disorder)   Severe episode of recurrent major depressive disorder, with psychotic features (HCC)   Generalized anxiety disorder   AKI (acute kidney injury) (HCC)   Encounter for central line placement   Abdominal distention   Tachycardia   Primary hypertension   Ileus (HCC)   Acute respiratory failure with hypoxemia  [facility]   Consultants:  PCCM  Nephrology  Psychiatry - signed off 3/24  Neurology   Procedures:  ETT 3/3 >>3/4  ETT 3/6 >> 3/14  HD catheter 3/9 >> 3/15  ETT 3/16 >>  Trach 3/18 >>   Antibiotics: Ceftriaxone 3/3 >> 3/6 Azithromycin 3/3 x 1 Cefazolin 3/6 >> 3/7 Ceftriaxone 3/7 >> 3/9 Vanc 3/6 X 1 dose Vanc 3/7 >> 3/8 Ceftriaxone 3/13 >> 3/15 Cefepime 3/16 >>   Time spent: 35 minutes    Junious Silk ANP  [facility] 7 am - 330 pm/M-F for direct patient care and secure  chat Please refer to Amion for contact info 22  days

## 2020-11-18 NOTE — Progress Notes (Signed)
Inpatient Rehabilitation Admissions Coordinator  I met with patient with his sister, Laqueta Linden, at bedside. I discussed goals an expectations a possible CIR admit. Patient is in agreement but wants to get home as soon as able. I have followed up with the [facility], [place], at [phone] ext [phone] per direction of [place] with TOC. I left a voicemail to determine VA connection and to seek authorization for a possible CIR admit. His PCP is Dr. Tawanna Cooler and he uses [facility]. I will follow up after clarification with the VA. Are there plans to begin downsize of trach towards decannulation?  Danne Baxter, RN, MSN Rehab Admissions Coordinator [phone] 11/18/2020 10:33 AM

## 2020-11-18 NOTE — Progress Notes (Signed)
  Speech Language Pathology Treatment: Dysphagia  Patient Details Name: Andrew [name] MRN: [id number] DOB: Aug 26, 1981 Today's Date: 11/18/2020 Time: 1324-4010 SLP Time Calculation (min) (ACUTE ONLY): 14.12 min  Assessment / Plan / Recommendation Clinical Impression  Pt was seen for treatment. He was alert and cooperative throughout the session. Pt reported that he was tired since he did not sleep well last night and the session was abbreviated for this reason. Pt tolerated PMSV for the duration of the session with stable vitals. Vocal quality is now WNL, but breath support still mildly reduced. Pt demonstrated improved breath support with verbal prompts and demonstrated 80% accuracy during structured tasks. Pt's oropharyngeal swallow mechanism appeared WNL for thin liquids and regular texture solids. His diet will be advanced to regular and thin. PMSV may be used during all waking hours and removed for sleep. SLP will continue to follow pt.    HPI HPI: Pt is a 40 y/o male with PMH PTSD, and anxiety. He was admitted 3/2 with two weeks of chest pain.  Pt was emergently intubated 3/2-3/4. EEG suggested moderate, diffuse encephalopathy without seizure activity. Found to be in rhabdo with AKI. Pt reintubated 3/6-3/14 for airway protection due to hypoactive delirium. BSE 3/15: no signs of aspiration or dysphagia. He both passed a 3 oz water swallow with RN and SLP. A clear liquid diet had been started by MD at that time and SLP recommended advancement per critical care. Episode of desaturation after eating on 3/15, MD suspected silent aspiration. Vasovagal event with ice water. Pt re-intubated 3/17-trach 3/18.  CXR 3/18: Patchy interstitial and airspace opacities persist compatible with multi lobar pneumonia.      SLP Plan  Continue with current plan of care       Recommendations  Diet recommendations: Regular;Thin liquid Liquids provided via: Cup;Straw Medication Administration: Whole meds with  liquid Supervision: Patient able to self feed Compensations: Minimize environmental distractions      Patient may use Passy-Muir Speech Valve: During PO intake/meals;During all waking hours (remove during sleep) PMSV Supervision: Intermittent MD: Please consider changing trach tube to : Smaller size;Cuffless         Oral Care Recommendations: Oral care BID Follow up Recommendations: Inpatient Rehab SLP Visit Diagnosis: Aphonia (R49.1);Dysphagia, oropharyngeal phase (R13.12) Plan: Continue with current plan of care       Charizma Gardiner I. Vear Clock, MS, CCC-SLP Acute Rehabilitation Services Office number [phone] Pager [phone]                Scheryl Marten 11/18/2020, 5:25 PM

## 2020-11-18 NOTE — Progress Notes (Addendum)
Inpatient Rehabilitation Admissions Coordinator  I spoke with Marcelino Duster at the [place] and she states CIR will not be covered by his VA benefits since they were not notified within the 72 hrs of admission. I discussed with Marcelino Duster, RN CM of TOC. I will follow up with patient and his sister on Monday of rehab venue options.  I have notified Christia Reading in financial counseling of need to assess for disability and Medicaid.  If patient were to be admitted to CIR, He would be admitted as uninsured, financial counseling would assess for long term disability and medicaid eligibility. I will follow up on Monday.  Ottie Glazier, RN, MSN Rehab Admissions Coordinator [phone] 11/18/2020 12:54 PM

## 2020-11-18 NOTE — Progress Notes (Signed)
Patient admitted to 5W from 66M. Patient is alert and oriented x4. Vital signs are stable but heart rate is elevated and he is on room air. Has no complaints of pain. Skin is intact, has some scattered scabs and old scratches on his lower abdomen, no signs of skin breakdown noted on exam. Patient belongings at bedside (clothing, cell phone, lotion, toothbrush and toothpaste). The patient was shown how to use the call bell. Call bell, phone and bedside table are within reach; bed is in the lowest position.

## 2020-11-18 NOTE — TOC Initial Note (Addendum)
Transition of Care [facility]) - Initial/Assessment Note    Patient Details  Name: Andrew [name] MRN: [id number] Date of Birth: 06-30-1981  [facility] CM/SW Contact:    Janae Bridgeman, RN Phone Number: 11/18/2020, 11:56 AM  Clinical Narrative:                 Case management spoke with the patient at the bedside in regards to transitions of care - needing CIR if eligible.  The patient currently lives with his girlfriend in the home alone with a 57 year old child.  I spoke with the patient and updated him that I called the VA this morning at 0800 am and notified them of patient's admission to the hospital since the patient was unable to call himself upon admission and hospital notes did not note that VA had been contacted.  I called and spoke with April, CM at [facility] and notified her that the patient was admitted on 10/26/2020 and she stated that the VA was unaware that the patient was admitted to the hospital at this time and this may effect patient's insurance benefits and payments.  The patient's MSW at the [facility] is [place], MSW - [phone] and [phone].  The patient's primary care at [facility] in [place] is Girard Cooter, MD.  I also called the [place] # [phone] and notified them about patient's admission information and patient's need for rehab services as recommded by PT.  The patient currently has a trach with trach collar humidity.  The patient is awake and alert and is expecting to speak with CIR today.  I called Ottie Glazier, CM with CIR and updated her on the contact information for the [place] and that the [place] had not been called until this morning.  She is aware and will follow up with the VA as well for needed transition of care.  I will place a consult with VA concerning patient's need for Apixaban 10 mg BID for 7 days and then 5 mg po BID upon discharge from the hospital after possible CIR.  CM and MSW will continue to follow the patient for  transition of care needs.  11/18/2020 1337 - I spoke with Ottie Glazier, RNCM with CIR at [facility] and unable to offer admission to CIR since patient with no VA benefits with this admission since [place] was not notified of admission to [facility] in the first 72 hours.  I called Mallie Darting, MSW back at the [place] to explain cercumstances regarding patient being unresponsive upon arrival to [facility], tachycardic with HR 280's, Septic, fever and respiratory failure upon admission.  I will follow up with the patient and sister on Monday once I speak with the VA again to advocate for a workaround for insurance coverage through the [place] for benefits needed for this patient.  I paged the medical social worker at the [place] and I'm waiting to return call from the [place] - otherwise I will follow up with the VA again on Monday along with family supports.  Expected Discharge Plan: IP Rehab Facility Barriers to Discharge: Continued Medical Work up   Patient Goals and CMS Choice Patient states their goals for this hospitalization and ongoing recovery are:: Patient is hopeful to go to inpatient rehabilitation for PT. CMS Medicare.gov Compare Post Acute Care list provided to:: Patient Choice offered to / list presented to : Patient  Expected Discharge Plan and Services Expected Discharge Plan: IP Rehab Facility In-house Referral: Clinical Social Work,PCP /  [facility] Discharge Planning Services: CM Consult,Medication Assistance Post Acute Care Choice: IP Rehab Living arrangements for the past 2 months: Single Family Home                                      Prior Living Arrangements/Services Living arrangements for the past 2 months: Single Family Home   Patient language and need for interpreter reviewed:: Yes Do you feel safe going back to the place where you live?: Yes      Need for Family Participation in Patient Care: Yes (Comment) Care giver support system in place?: Yes (comment)   Criminal Activity/Legal  Involvement Pertinent to Current Situation/Hospitalization: No - Comment as needed  Activities of Daily Living      Permission Sought/Granted Permission sought to share information with : Case Manager,Other (comment) (VA called to update on patient's admit to the hospital.) Permission granted to share information with : Yes, Verbal Permission Granted     Permission granted to share info w AGENCY: VA contacted, family supports        Emotional Assessment Appearance:: Appears stated age Attitude/Demeanor/Rapport: Self-Confident Affect (typically observed): Accepting Orientation: : Oriented to Self,Oriented to Place,Oriented to  Time,Oriented to Situation Alcohol / Substance Use: Not Applicable Psych Involvement: No (comment)  Admission diagnosis:  Acute respiratory failure (HCC) [J96.00] Tachycardia [R00.0] Chest pain [R07.9] Patient Active Problem List   Diagnosis Date Noted  . Acute respiratory failure with hypoxemia (HCC)   . Abdominal distention   . Tachycardia   . Primary hypertension   . Ileus (HCC)   . AKI (acute kidney injury) (HCC)   . Encounter for central line placement   . PTSD (post-traumatic stress disorder)   . Severe episode of recurrent major depressive disorder, with psychotic features (HCC)   . Generalized anxiety disorder   . Acute on chronic respiratory failure with hypoxia (HCC) 10/27/2020   PCP:  Agustina Caroli, MD Pharmacy:   [facility] [place], [place] - [address] [place] [address] Phone: [phone] Fax: [phone]     Social Determinants of Health (SDOH) Interventions    Readmission Risk Interventions Readmission Risk Prevention Plan 11/18/2020  Transportation Screening Complete  PCP or Specialist Appt within 5-7 Days Complete  Home Care Screening Complete  Medication Review (RN CM) Complete  Some recent data might be hidden

## 2020-11-19 ENCOUNTER — Inpatient Hospital Stay (HOSPITAL_COMMUNITY): Payer: Medicaid Other

## 2020-11-19 ENCOUNTER — Encounter (HOSPITAL_COMMUNITY): Payer: Self-pay | Admitting: Pulmonary Disease

## 2020-11-19 DIAGNOSIS — R079 Chest pain, unspecified: Secondary | ICD-10-CM

## 2020-11-19 DIAGNOSIS — R1312 Dysphagia, oropharyngeal phase: Secondary | ICD-10-CM

## 2020-11-19 DIAGNOSIS — R000 Tachycardia, unspecified: Secondary | ICD-10-CM

## 2020-11-19 DIAGNOSIS — I10 Essential (primary) hypertension: Secondary | ICD-10-CM

## 2020-11-19 LAB — BASIC METABOLIC PANEL
Anion gap: 10 (ref 5–15)
BUN: 17 mg/dL (ref 6–20)
CO2: 23 mmol/L (ref 22–32)
Calcium: 9.4 mg/dL (ref 8.9–10.3)
Chloride: 106 mmol/L (ref 98–111)
Creatinine, Ser: 1.17 mg/dL (ref 0.61–1.24)
GFR, Estimated: >60 mL/min (ref >60)
Glucose, Bld: 110 mg/dL — ABNORMAL HIGH (ref 70–99)
Potassium: 4.1 mmol/L (ref 3.5–5.1)
Sodium: 139 mmol/L (ref 135–145)

## 2020-11-19 LAB — TROPONIN I (HIGH SENSITIVITY)
Troponin I (High Sensitivity): 6 ng/L (ref <18)
Troponin I (High Sensitivity): 7 ng/L (ref <18)

## 2020-11-19 LAB — BRAIN NATRIURETIC PEPTIDE: B Natriuretic Peptide: 11.4 pg/mL (ref 0.0–100.0)

## 2020-11-19 LAB — CK: Total CK: 1665 U/L — ABNORMAL HIGH (ref 49–397)

## 2020-11-19 MED ORDER — LACTATED RINGERS IV SOLN: INTRAVENOUS | Status: AC

## 2020-11-19 NOTE — Progress Notes (Signed)
PROGRESS NOTE  Andrew [name] WGN:[id number] DOB: July 18, 1981   PCP: Agustina Caroli, MD  Patient is from: Home  DOA: 10/26/2020 LOS: 23  Chief complaints: Altered mental status  Brief Narrative / Interim history: 40 year old male [employer] veteran with past medical history significant for PTSD/GAD, essential hypertension who initially presented to the ED with chest pain and delirium.  During ED evaluation he became combative, refusing care and notably tachycardic with heart rates of 280 with a temperature of 102.5.  Patient was emergently intubated for airway protection and admitted to the PCCM service.  Patient was found to have rhabdomyolysis, acute renal failure requiring HD.  There was concern for Wellbutrin and Seroquel contributing to this.  Several attempts at extubation were made that were unsuccessful and ultimately patient underwent tracheostomy on 11/11/2020.  Patient was transferred from [facility] to [facility] on 11/16/2020.  Subjective: Seen and examined this afternoon.  No major events overnight of this morning.  Has some tachycardia.  He just had decannulation without complication.  Initially, he had no complaints other than mild pain in his neck with decannulation.  After I left the room, he complained of chest pain to RT. when I went to evaluate him, he was lying comfortably.  He reports chest pain pointing at epigastric area that he describes as tightness.  Pain is worse with cough.  Not sure how long he had this pain although he denies chest pain when I saw him about 30 minutes prior.  He denies shortness of breath, nausea or vomiting.  He rates his pain "severe" but doesn't appear to be in that much distress.  He is tachycardic but improving.  Objective: Vitals:   11/19/20 0830 11/19/20 1103 11/19/20 1220 11/19/20 1530  BP: 131/73 131/73 (!) 98/56 109/60  Pulse: (!) 120 (!) 113 (!) 103 (!) 114  Resp: Temp: 98.8 F (37.1 C)  98.6 F (37 C)   TempSrc: Axillary  Axillary    SpO2: 98% 97% 98% 96%  Weight:      Height:       No intake or output data in the 24 hours ending 11/19/20 1612 Filed Weights   11/17/20 0500 11/18/20 0329 11/19/20 0329  Weight: 95.4 kg 91.2 kg 94.3 kg    Examination:  GENERAL: No apparent distress.  Nontoxic. HEENT: MMM.  Vision and hearing grossly intact.  NECK: Supple.  No apparent JVD.  Status post decannulation. RESP: On RA.  No IWOB.  Fair aeration bilaterally. CVS:  RRR. Heart sounds normal.  ABD/GI/GU: BS+. Abd soft, NTND.  MSK/EXT:  Moves extremities. No apparent deformity. No edema.  SKIN: no apparent skin lesion or wound NEURO: Awake, alert and oriented appropriately.  No apparent focal neuro deficit. PSYCH: Somewhat anxious.  Procedures:   ETT 3/3 >>3/4  ETT 3/6 >> 3/14  HD catheter 3/9 >> 3/15  ETT 3/16 >>  Trach 3/18 >>    Assessment & Plan: Acute hypoxemic respiratory failure in the setting of encephalopathy/delirium and aspiration pneumonitis/pneumonia: Resolved. -s/p trach and decannulation.  Acute metabolic encephalopathy due to hypoactive delirium: Resolved Hx PTSD, GAD, MDD-acute psychosis on admission and required Precedex. UDS and head CT negative on admission.  Denies history of drug abuse but family suspects possible use.  -Psychiatry following-continue Zoloft and prazosin -Could not start antipsychotics due to rhabdomyolysis -Klonopin 1 mg p.o. twice daily as needed anxiety  Multilobar pneumonia, aspiration pneumonitis -Completed 7-day course of cefepime  Bilateral DVTs with possible PE: Did not  get CTA chest likely due to AKI. TTE 11/07/2020 with no RV heart strain appreciated.  Normal LVEF without LV regional wall motion abnormalities. -Continue Eliquis  Dysphagia-Dysphagia 3 diet with thin liquids.  Acute renal failure: Likely ATN from rhabdo.  Received HD.  AKI resolved. Recent Labs    11/11/20 0511 11/12/20 0436 11/13/20 0238 11/14/20 0033 11/14/20 1906 11/15/20 0157  11/16/20 0030 11/17/20 0313 11/18/20 0122 11/19/20 0127  BUN 42* 40* 27* 20 22* 23* 20 17 15 17   CREATININE 1.83* 1.75* 1.43* 1.19 1.18 1.23 1.22 1.17 1.14 1.17    Rhabdomyolysis: Improved. -Start IV fluid -Recheck CK in the morning  Anemia secondary to critical illness: Stable Recent Labs    11/08/20 0309 11/08/20 0330 11/09/20 0237 11/09/20 1653 11/10/20 0354 11/11/20 0511 11/12/20 0436 11/14/20 0033 11/16/20 0030 11/18/20 0122  HGB 8.8* 8.6* 8.9* 9.9* 8.9* 8.5* 8.3* 9.6* 8.8* 9.3*  -Vitamin B12 daily  Essential hypertension: Normotensive for most part -Amlodipine 10 mg p.o. daily -Carvedilol 25 mg p.o. twice daily -Clonidine 0.1 mg p.o. 3 times daily -Hydralazine 50 mg p.o. every 6 hours  Sinus tachycardia: Related to anxiety?  Improving. -Coreg and clonidine as above -IV fluid  Hypomagnesemia: Resolved  Weakness/deconditioning/debility. -PT/OT recommended CIR but CIR recommended SNF  Chest pain: Anxiety?  Worse with cough.  CXR without acute finding.  EKG with sinus tachycardia but no significant ischemic finding.  Already on Eliquis for VTE. -Check BMP and troponin.  -May consider scheduling low-dose Klonopin-has not gotten as needed in 3 days -Continue PPI  Nutrition: Body mass index is 29.83 kg/m. Nutrition Problem: Increased nutrient needs Etiology: acute illness Signs/Symptoms: estimated needs Interventions: Ensure Enlive (each supplement provides 350kcal and 20 grams of protein),MVI   DVT prophylaxis:  SCDs Start: 10/27/20 0610 apixaban (ELIQUIS) tablet 10 mg  apixaban (ELIQUIS) tablet 5 mg  Code Status: Full code Family Communication: Patient and/or RN. Available if any question.  Level of care: Progressive Status is: Inpatient  Remains inpatient appropriate because:Hemodynamically unstable, Ongoing diagnostic testing needed not appropriate for outpatient work up, IV treatments appropriate due to intensity of illness or inability to  take PO and Inpatient level of care appropriate due to severity of illness   Dispo:  Patient From: Home  Planned Disposition: Inpatient Rehab  Medically stable for discharge: Yes         Consultants:   PCCM  Nephrology-signed off  Psychiatry - signed off 3/24  Neurology-signed off    Sch Meds:  Scheduled Meds: . amLODipine  10 mg Oral Daily  . apixaban  10 mg Oral BID   Followed by  . [START ON 11/22/2020] apixaban  5 mg Oral BID  . carvedilol  25 mg Oral BID WC  . chlorhexidine  15 mL Mouth Rinse BID  . Chlorhexidine Gluconate Cloth  6 each Topical Q0600  . cloNIDine  0.1 mg Oral TID  . docusate sodium  100 mg Oral BID  . feeding supplement  237 mL Oral BID BM  . hydrALAZINE  50 mg Oral Q6H  . mouth rinse  15 mL Mouth Rinse q12n4p  . multivitamin with minerals  1 tablet Oral Daily  . pantoprazole  40 mg Oral QHS  . polyethylene glycol  17 g Oral Daily  . prazosin  4 mg Oral QHS  . senna  1 tablet Oral BID  . sertraline  50 mg Oral QHS  . sodium chloride flush  10-40 mL Intracatheter Q12H  . sodium chloride flush  10-40  mL Intracatheter Q12H  . traZODone  50 mg Oral BID  . vitamin B-12  1,000 mcg Oral Daily   Continuous Infusions: . sodium chloride Stopped (11/08/20 1130)  . lactated ringers     PRN Meds:.sodium chloride, acetaminophen, clonazepam, haloperidol lactate, hydrALAZINE, HYDROmorphone (DILAUDID) injection, ondansetron (ZOFRAN) IV, polyethylene glycol, sodium chloride flush, sodium chloride flush  Antimicrobials: Anti-infectives (From admission, onward)   Start     Dose/Rate Route Frequency Ordered Stop   11/11/20 1400  ceFEPIme (MAXIPIME) 2 g in sodium chloride 0.9 % 100 mL IVPB        2 g 200 mL/hr over 30 Minutes Intravenous Every 8 hours 11/11/20 1016 11/15/20 2124   11/09/20 1645  ceFEPIme (MAXIPIME) 2 g in sodium chloride 0.9 % 100 mL IVPB  Status:  Discontinued        2 g 200 mL/hr over 30 Minutes Intravenous Every 12 hours 11/09/20  1555 11/11/20 1016   11/06/20 1145  cefTRIAXone (ROCEPHIN) 2 g in sodium chloride 0.9 % 100 mL IVPB        2 g 200 mL/hr over 30 Minutes Intravenous Every 24 hours 11/06/20 1052 11/08/20 1202   11/06/20 1145  metroNIDAZOLE (FLAGYL) IVPB 500 mg  Status:  Discontinued        500 mg 100 mL/hr over 60 Minutes Intravenous Every 8 hours 11/06/20 1052 11/07/20 0901   11/01/20 2200  cefTRIAXone (ROCEPHIN) 2 g in sodium chloride 0.9 % 100 mL IVPB        2 g 200 mL/hr over 30 Minutes Intravenous Every 24 hours 11/01/20 0807 11/02/20 2240   10/31/20 1223  vancomycin variable dose per unstable renal function (pharmacist dosing)  Status:  Discontinued         Does not apply See admin instructions 10/31/20 1223 11/01/20 0807   10/31/20 0945  cefTRIAXone (ROCEPHIN) 2 g in sodium chloride 0.9 % 100 mL IVPB  Status:  Discontinued        2 g 200 mL/hr over 30 Minutes Intravenous Every 12 hours 10/31/20 0847 11/01/20 0807   10/29/20 2345  vancomycin (VANCOREADY) IVPB 2000 mg/400 mL        2,000 mg 200 mL/hr over 120 Minutes Intravenous  Once 10/29/20 2259 10/30/20 1237   10/29/20 2300  ceFAZolin (ANCEF) IVPB 1 g/50 mL premix  Status:  Discontinued        1 g 100 mL/hr over 30 Minutes Intravenous Every 12 hours 10/29/20 2212 10/31/20 0845   10/29/20 2259  vancomycin variable dose per unstable renal function (pharmacist dosing)  Status:  Discontinued         Does not apply See admin instructions 10/29/20 2259 10/30/20 1021   10/27/20 0815  cefTRIAXone (ROCEPHIN) 2 g in sodium chloride 0.9 % 100 mL IVPB  Status:  Discontinued        2 g 200 mL/hr over 30 Minutes Intravenous Every 24 hours 10/27/20 0801 10/29/20 2259   10/27/20 0815  azithromycin (ZITHROMAX) 500 mg in sodium chloride 0.9 % 250 mL IVPB  Status:  Discontinued        500 mg 250 mL/hr over 60 Minutes Intravenous Every 24 hours 10/27/20 0801 10/28/20 0757       I have personally reviewed the following labs and images: CBC: Recent Labs  Lab  11/14/20 0033 11/16/20 0030 11/18/20 0122  WBC 10.5 8.6 7.5  HGB 9.6* 8.8* 9.3*  HCT 29.9* 27.5* 28.9*  MCV 96.5 96.8 94.4  PLT 561* 506* 563*  BMP &GFR Recent Labs  Lab 11/14/20 0918 11/14/20 1906 11/15/20 0157 11/16/20 0030 11/17/20 0313 11/18/20 0122 11/19/20 0127  NA  --    < > 147* 141 139 138 139  K  --    < > 4.0 3.7 3.5 3.9 4.1  CL  --    < > 116* 111 109 104 106  CO2  --    < > 22 22 22 24 23   GLUCOSE  --    < > 121* 118* 104* 113* 110*  BUN  --    < > 23* 20 17 15 17   CREATININE  --    < > 1.23 1.22 1.17 1.14 1.17  CALCIUM  --    < > 9.1 8.7* 9.0 9.3 9.4  MG 1.9  --   --   --  1.6* 1.8  --    < > = values in this interval not displayed.   Estimated Creatinine Clearance: 94.8 mL/min (by C-G formula based on SCr of 1.17 mg/dL). Liver & Pancreas: Recent Labs  Lab 11/13/20 0238 11/14/20 0033 11/15/20 0157 11/16/20 0030  AST 30 32 60* 58*  ALT 22 19 23 23   ALKPHOS 69 62 67 57  BILITOT 0.5 0.5 0.8 0.9  PROT 7.0 6.8 7.3 6.9  ALBUMIN 3.3* 3.2* 3.4* 3.2*   No results for input(s): LIPASE, AMYLASE in the last 168 hours. No results for input(s): AMMONIA in the last 168 hours. Diabetic: No results for input(s): HGBA1C in the last 72 hours. Recent Labs  Lab 11/17/20 1940 11/17/20 2318 11/18/20 0321 11/18/20 0801 11/18/20 1104  GLUCAP 120* 108* 106* 133* 123*   Cardiac Enzymes: Recent Labs  Lab 11/14/20 0033 11/17/20 0313 11/19/20 0127  CKTOTAL 560* 1,685* 1,665*   No results for input(s): PROBNP in the last 8760 hours. Coagulation Profile: No results for input(s): INR, PROTIME in the last 168 hours. Thyroid Function Tests: No results for input(s): TSH, T4TOTAL, FREET4, T3FREE, THYROIDAB in the last 72 hours. Lipid Profile: No results for input(s): CHOL, HDL, LDLCALC, TRIG, CHOLHDL, LDLDIRECT in the last 72 hours. Anemia Panel: No results for input(s): VITAMINB12, FOLATE, FERRITIN, TIBC, IRON, RETICCTPCT in the last 72 hours. Urine analysis:     Component Value Date/Time   COLORURINE YELLOW 11/07/2020 1445   APPEARANCEUR CLEAR 11/07/2020 1445   LABSPEC 1.011 11/07/2020 1445   PHURINE 6.0 11/07/2020 1445   GLUCOSEU NEGATIVE 11/07/2020 1445   HGBUR SMALL (A) 11/07/2020 1445   BILIRUBINUR NEGATIVE 11/07/2020 1445   KETONESUR 20 (A) 11/07/2020 1445   PROTEINUR NEGATIVE 11/07/2020 1445   NITRITE NEGATIVE 11/07/2020 1445   LEUKOCYTESUR NEGATIVE 11/07/2020 1445   Sepsis Labs: Invalid input(s): PROCALCITONIN, LACTICIDVEN  Microbiology: Recent Results (from the past 240 hour(s))  Culture, Respiratory w Gram Stain     Status: None   Collection Time: 11/10/20 10:33 AM   Specimen: Tracheal Aspirate; Respiratory  Result Value Ref Range Status   Specimen Description TRACHEAL ASPIRATE  Final   Special Requests Normal  Final   Gram Stain   Final    RARE WBC PRESENT, PREDOMINANTLY MONONUCLEAR NO ORGANISMS SEEN    Culture   Final    RARE Normal respiratory flora-no Staph aureus or Pseudomonas seen Performed at [facility], [address] [place], [facility] [address]    Report Status 11/12/2020 FINAL  Final  MRSA PCR Screening     Status: None   Collection Time: 11/10/20  1:03 PM   Specimen: Nasal Mucosa; Nasopharyngeal  Result Value Ref Range Status   MRSA by PCR NEGATIVE NEGATIVE Final    Comment:        The GeneXpert MRSA Assay (FDA approved for NASAL specimens only), is one component of a comprehensive MRSA colonization surveillance program. It is not intended to diagnose MRSA infection nor to guide or monitor treatment for MRSA infections. Performed at [facility], [address] [place], [place] [address]     Radiology Studies: No results found.    Taye T. Gonfa [facility]  If 7PM-7AM, please contact night-coverage [email or web address] 11/19/2020, 4:12 PM

## 2020-11-19 NOTE — Progress Notes (Signed)
11/19/2020  I have seen and evaluated the patient for trach followup.  S:  RT Vernona Rieger called: unable to pass catheter through trach.  No color change on CO2 detector.  Patient has some neck discomfort but otherwise well, speaking easily.  Strong cough.  O: Blood pressure (!) 98/56, pulse (!) 103, temperature 98.6 F (37 C), temperature source Axillary, resp. rate 18, height 5\' 10"  (1.778 m), weight 94.3 kg, SpO2 98 %.  No acute distress Trach in place, cannot feel air pass through trach even with cough Lungs clear Moves all 4 ext  Ongoing mild rhabdo ? etiology  A:  Recurrent respiratory failure in setting of difficult-to-control psychosis, polysubstance abuse, and aspiration pneumonitis.  Everything seems to have settled out.  Patient's trach no longer functional and likely plugged off at some point in last 24h.  Rather than replace, reasonable to just take out as the issues leading to respiratory failure have largely resolved.  P:  Decannulate We will check on him tomorrow   MD [facility] Prefer epic messenger for cross cover needs If after hours, please call E-link

## 2020-11-19 NOTE — Progress Notes (Signed)
   11/19/20 0425  Assess: MEWS Score  Temp 98.9 F (37.2 C)  BP 107/67  Pulse Rate (!) 125  ECG Heart Rate (!) 124  Resp 20  Level of Consciousness Alert  SpO2 96 %  O2 Device Tracheostomy Collar  Patient Activity (if Appropriate) In bed  O2 Flow Rate (L/min) 5 L/min  FiO2 (%) 28 %  Assess: MEWS Score  MEWS Temp 0  MEWS Systolic 0  MEWS Pulse 2  MEWS RR 0  MEWS LOC 0  MEWS Score 2  MEWS Score Color Yellow  Assess: if the MEWS score is Yellow or Red  Were vital signs taken at a resting state? Yes  Focused Assessment No change from prior assessment  Early Detection of Sepsis Score *See Row Information* Low  MEWS guidelines implemented *See Row Information* Yes  Treat  MEWS Interventions Escalated (See documentation below)  Take Vital Signs  Increase Vital Sign Frequency  Yellow: Q 2hr X 2 then Q 4hr X 2, if remains yellow, continue Q 4hrs  Escalate  MEWS: Escalate Yellow: discuss with charge nurse/RN and consider discussing with provider and RRT  Notify: Charge Nurse/RN  Name of Charge Nurse/RN Notified Lowella Bandy, Consulting civil engineer  Date Charge Nurse/RN Notified 11/19/20  Time Charge Nurse/RN Notified 0425  Document  Patient Outcome Other (Comment) (Increase VS frequency)  Progress note created (see row info) Yes

## 2020-11-19 NOTE — Procedures (Signed)
Tracheostomy Note  Patient Details:   Name: Andrew [name] DOB: December 23, 1980 MRN: [id number]    Airway Documentation:  Patient decannulated at this time per Dr Adaline Sill. Dressing placed over stoma. Patient on room air sats 96% and tolerating well. Will continue to monitor.   Evaluation  O2 sats: stable throughout Complications: No apparent complications Patient did tolerate procedure well. Bilateral Breath Sounds: Clear,Diminished    Suszanne Conners 11/19/2020, 3:31 PM

## 2020-11-19 NOTE — Progress Notes (Signed)
Occupational Therapy Treatment Patient Details Name: Andrew [name] MRN: [id number] DOB: 10-05-1980 Today's Date: 11/19/2020    History of present illness 40y/o male admitted 3/2 with two weeks of chest pain.  Pt was emergently intubated 3/2- 3/4. EEG suggested moderate, diffuse encephalopathy without seizure activity. Found to be in rhabdo with AKI. RUE pain--MRI shoulder:Edema in musculature about the shoulder. reintubated 3/6 due to hypoactive delirium for airway protection; 3/7 lumbar puncture. Extubation 3/14. Re-intubated 3/17.  Trach 3/18.  PMhx: PTSD, anxiety.   OT comments  Pt. Seen for skilled OT treatment.  Focus of session included bed mobility, LB dressing, and functional transfers in preparation for toileting tasks and adls.  Pt. Tolerated well.  Pt. Will benefit from continued OT to address the below listed limitations in order to improve overall functional mobility and facilitate independence with BADL participation.  DC plan remains appropriate, will follow acutely per POC.    Follow Up Recommendations  CIR;Supervision/Assistance - 24 hour    Equipment Recommendations  None recommended by OT    Recommendations for Other Services Rehab consult    Precautions / Restrictions Precautions Precautions: Fall Precaution Comments: RUE not painful but remains with limited ROM       Mobility Bed Mobility Overal bed mobility: Needs Assistance Bed Mobility: Supine to Sit     Supine to sit: Supervision     General bed mobility comments: supervision for safety    Transfers Overall transfer level: Needs assistance Equipment used: None Transfers: Sit to/from Stand;Stand Pivot Transfers Sit to Stand: Min guard Stand pivot transfers: Min guard            Balance                                           ADL either performed or assessed with clinical judgement   ADL Overall ADL's : Needs assistance/impaired Eating/Feeding: Set  up;Sitting Eating/Feeding Details (indicate cue type and reason): cutting and eating french toast, limited rom to get utensil to mouth without notable effort                 Lower Body Dressing: Supervision/safety;Sitting/lateral leans Lower Body Dressing Details (indicate cue type and reason): able to doff and don socks seated eob Toilet Transfer: Min Manufacturing systems engineer Details (indicate cue type and reason): simulated eob to recliner         Functional mobility during ADLs: Min guard General ADL Comments: pt. alert and agreeable to participation.  able to compelte bed mobility, lb dressing, and transfer to recliner     Vision       Perception     Praxis      Cognition Arousal/Alertness: Awake/alert Behavior During Therapy: Flat affect Overall Cognitive Status: Impaired/Different from baseline                                          Exercises     Shoulder Instructions       General Comments      Pertinent Vitals/ Pain       Pain Assessment: Faces Faces Pain Scale: Hurts a little bit Pain Location: trach site Pain Descriptors / Indicators: Grimacing Pain Intervention(s): Limited activity within patient's tolerance;Repositioned  Home Living  Prior Functioning/Environment              Frequency  Min 2X/week        Progress Toward Goals  OT Goals(current goals can now be found in the care plan section)  Progress towards OT goals: Progressing toward goals     Plan Discharge plan remains appropriate    Co-evaluation                 AM-PAC OT "6 Clicks" Daily Activity     Outcome Measure   Help from another person eating meals?: A Little Help from another person taking care of personal grooming?: A Little Help from another person toileting, which includes using toliet, bedpan, or urinal?: A Little Help from another person bathing (including  washing, rinsing, drying)?: A Little Help from another person to put on and taking off regular upper body clothing?: A Little Help from another person to put on and taking off regular lower body clothing?: A Little 6 Click Score: 18    End of Session    OT Visit Diagnosis: Unsteadiness on feet (R26.81);Muscle weakness (generalized) (M62.81);Other symptoms and signs involving cognitive function Pain - Right/Left: Right Pain - part of body: Arm   Activity Tolerance Patient tolerated treatment well   Patient Left in chair;with call bell/phone within reach   Nurse Communication Other (comment) (spoke with rn to see if pt. needed chair alarm), also that pt. Had questions regarding his trach.         Time: 4715-9539 OT Time Calculation (min): 17 min  Charges: OT General Charges $OT Visit: 1 Visit OT Treatments $Self Care/Home Management : 8-22 mins  Boneta Lucks, COTA/L Acute Rehabilitation [phone]    11/19/2020, 1:27 PM

## 2020-11-19 NOTE — Progress Notes (Signed)
Notified by RT that the patient was complaining of chest pain. MD was made aware. MD at the bedside and evaluated the patient. RN did an EKG while MD in room. EKG placed on chart. MD said to monitor the patient and let him know if there are any changes with the chest pain. Nurse will continue to monitor.

## 2020-11-19 NOTE — Progress Notes (Signed)
RT NOTES: Went to assess patient, patient requested suctioning. When patient was suctioned, catheter unable to be passed. No color change on ETCO2 detector and patient speaking clearly. No air movement coming from trach, only from mouth. Called Dr Katrinka Blazing to come and assess patient's trach.

## 2020-11-20 LAB — CBC
HCT: 28.5 % — ABNORMAL LOW (ref 39.0–52.0)
Hemoglobin: 9.3 g/dL — ABNORMAL LOW (ref 13.0–17.0)
MCH: 30.8 pg (ref 26.0–34.0)
MCHC: 32.6 g/dL (ref 30.0–36.0)
MCV: 94.4 fL (ref 80.0–100.0)
Platelets: 589 10*3/uL — ABNORMAL HIGH (ref 150–400)
RBC: 3.02 MIL/uL — ABNORMAL LOW (ref 4.22–5.81)
RDW: 12.8 % (ref 11.5–15.5)
WBC: 8.6 10*3/uL (ref 4.0–10.5)
nRBC: 0 % (ref 0.0–0.2)

## 2020-11-20 LAB — COMPREHENSIVE METABOLIC PANEL
ALT: 34 U/L (ref 0–44)
AST: 90 U/L — ABNORMAL HIGH (ref 15–41)
Albumin: 3.6 g/dL (ref 3.5–5.0)
Alkaline Phosphatase: 62 U/L (ref 38–126)
Anion gap: 11 (ref 5–15)
BUN: 13 mg/dL (ref 6–20)
CO2: 24 mmol/L (ref 22–32)
Calcium: 9.3 mg/dL (ref 8.9–10.3)
Chloride: 101 mmol/L (ref 98–111)
Creatinine, Ser: 1.13 mg/dL (ref 0.61–1.24)
GFR, Estimated: >60 mL/min (ref >60)
Glucose, Bld: 110 mg/dL — ABNORMAL HIGH (ref 70–99)
Potassium: 3.8 mmol/L (ref 3.5–5.1)
Sodium: 136 mmol/L (ref 135–145)
Total Bilirubin: 0.3 mg/dL (ref 0.3–1.2)
Total Protein: 7.4 g/dL (ref 6.5–8.1)

## 2020-11-20 LAB — MAGNESIUM: Magnesium: 1.7 mg/dL (ref 1.7–2.4)

## 2020-11-20 LAB — FERRITIN: Ferritin: 209 ng/mL (ref 24–336)

## 2020-11-20 LAB — CK: Total CK: 1920 U/L — ABNORMAL HIGH (ref 49–397)

## 2020-11-20 LAB — RETICULOCYTES
Immature Retic Fract: 12.5 % (ref 2.3–15.9)
RBC.: 2.94 MIL/uL — ABNORMAL LOW (ref 4.22–5.81)
Retic Count, Absolute: 70.6 10*3/uL (ref 19.0–186.0)
Retic Ct Pct: 2.4 % (ref 0.4–3.1)

## 2020-11-20 LAB — FOLATE: Folate: 14.3 ng/mL (ref >5.9)

## 2020-11-20 LAB — IRON AND TIBC
Iron: 22 ug/dL — ABNORMAL LOW (ref 45–182)
Saturation Ratios: 8 % — ABNORMAL LOW (ref 17.9–39.5)
TIBC: 288 ug/dL (ref 250–450)
UIBC: 266 ug/dL

## 2020-11-20 LAB — TSH: TSH: 1.117 u[IU]/mL (ref 0.350–4.500)

## 2020-11-20 LAB — VITAMIN B12: Vitamin B-12: 568 pg/mL (ref 180–914)

## 2020-11-20 LAB — PHOSPHORUS: Phosphorus: 3.8 mg/dL (ref 2.5–4.6)

## 2020-11-20 MED ORDER — AMLODIPINE BESYLATE 10 MG PO TABS
10.0000 mg | ORAL_TABLET | Freq: Every day | ORAL | Status: DC
  Administered 2020-11-20 – 2020-11-24 (×5): 10 mg via ORAL
  Filled 2020-11-20 (×5): qty 1

## 2020-11-20 MED ORDER — TRAZODONE HCL 50 MG PO TABS
50.0000 mg | ORAL_TABLET | Freq: Every day | ORAL | Status: DC
Start: 2020-11-21 — End: 2020-11-24
  Administered 2020-11-21 – 2020-11-23 (×3): 50 mg via ORAL
  Filled 2020-11-20 (×3): qty 1

## 2020-11-20 MED ORDER — HYDRALAZINE HCL 50 MG PO TABS: 50.0000 mg | ORAL_TABLET | Freq: Three times a day (TID) | ORAL | Status: DC

## 2020-11-20 MED ORDER — CLONIDINE HCL 0.1 MG PO TABS
0.1000 mg | ORAL_TABLET | Freq: Two times a day (BID) | ORAL | Status: DC
  Administered 2020-11-20 – 2020-11-24 (×8): 0.1 mg via ORAL
  Filled 2020-11-20 (×8): qty 1

## 2020-11-20 MED ORDER — LABETALOL HCL 5 MG/ML IV SOLN: 10.0000 mg | INTRAVENOUS | Status: DC | PRN

## 2020-11-20 NOTE — Progress Notes (Signed)
PROGRESS NOTE  Andrew ProctorCornell J [name] WUJ:[id number] DOB: 07/23/1981   PCP: Agustina CaroliHicks, Kristin D, MD  Patient is from: Home  DOA: 10/26/2020 LOS: 24  Chief complaints: Altered mental status  Brief Narrative / Interim history: 40 year old male [employer] veteran with past medical history significant for PTSD/GAD, essential hypertension who initially presented to the ED with chest pain and delirium.  During ED evaluation he became combative, refusing care and notably tachycardic with heart rates of 280 with a temperature of 102.5.  Patient was emergently intubated for airway protection and admitted to the PCCM service.  Patient was found to have rhabdomyolysis, acute renal failure requiring HD.  There was concern for Wellbutrin and Seroquel contributing to this.  Several attempts at extubation were made that were unsuccessful and ultimately patient underwent tracheostomy on 11/11/2020.  [facility]  Subjective: Seen and examined earlier this morning.  No major events overnight of this morning.  He asked to go home but no clear reason other than difficulty sleeping.  He denies chest pain, dyspnea, GI or UTI symptoms.  We have discussed about his medical condition.  I have explained to him that he is not medically ready to go home given his significant tachycardia and worsening rhabdomyolysis.  He is oriented x4 except date but limited insight into his situation.  Objective: Vitals:   11/20/20 0800 11/20/20 1015 11/20/20 1141 11/20/20 1200  BP: 111/72 109/72 121/75 117/69  Pulse:    (!) 106  Resp:  18 16 15   Temp: 98.5 F (36.9 C) 98.5 F (36.9 C) 99.2 F (37.3 C) 97.8 F (36.6 C)  TempSrc: Oral Oral Oral Oral  SpO2: 97% 96% 95% 97%  Weight:      Height:        Intake/Output Summary (Last 24 hours) at 11/20/2020 1354 Last data filed at 11/20/2020 1100 Gross per 24 hour  Intake 639.76 ml  Output 1090 ml  Net -450.24 ml   Filed Weights   11/17/20 0500  11/18/20 0329 11/19/20 0329  Weight: 95.4 kg 91.2 kg 94.3 kg    Examination:  GENERAL: No apparent distress.  Nontoxic. HEENT: MMM.  Vision and hearing grossly intact.  NECK: Dressing over tracheostomy incision DCI. RESP:  No IWOB.  Fair aeration bilaterally. CVS: HR in 120s.  Regular rhythm.. Heart sounds normal.  ABD/GI/GU: BS+. Abd soft, NTND.  MSK/EXT:  Moves extremities. No apparent deformity. No edema.  SKIN: no apparent skin lesion or wound NEURO: Awake, alert and oriented appropriately.  No apparent focal neuro deficit other than generalized weakness. PSYCH: Calm. Normal affect.  Procedures:   ETT 3/3 >>3/4  ETT 3/6 >> 3/14  HD catheter 3/9 >> 3/15  ETT 3/16 >>  Trach 3/18 >>    Assessment & Plan: Acute hypoxemic respiratory failure in the setting of encephalopathy/delirium and aspiration pneumonitis/pneumonia: Resolved. -s/p trach and decannulation.  Acute metabolic encephalopathy due to hypoactive delirium: Resolved Hx PTSD, GAD, MDD-acute psychosis on admission and required Precedex. UDS and head CT negative on admission.  Denies history of drug abuse but family suspects possible use.  -Psychiatry following-continue Zoloft and prazosin -Could not start antipsychotics due to rhabdomyolysis -Klonopin 1 mg p.o. twice daily as needed anxiety  Multilobar pneumonia, aspiration pneumonitis -Completed 7-day course of cefepime  Bilateral DVTs with possible PE: Did not get CTA chest likely due to AKI. TTE 11/07/2020 with no RV heart strain appreciated.  Normal LVEF without LV regional wall motion abnormalities. -Continue Eliquis  Dysphagia-Dysphagia 3 diet with thin liquids.  Acute renal failure: Likely ATN from rhabdo.  Received HD.  AKI resolved. Recent Labs    11/12/20 0436 11/13/20 0238 11/14/20 0033 11/14/20 1906 11/15/20 0157 11/16/20 0030 11/17/20 0313 11/18/20 0122 11/19/20 0127 11/20/20 0227  BUN 40* 27* 20 22* 23* 20 17 15 17 13    CREATININE 1.75* 1.43* 1.19 1.18 1.23 1.22 1.17 1.14 1.17 1.13   Rhabdomyolysis: CK went up from 1600-1900 -Increase IV LR to 125 cc an hour -Recheck CK in the morning  Anemia secondary to critical illness: Stable Recent Labs    11/08/20 0330 11/09/20 0237 11/09/20 1653 11/10/20 0354 11/11/20 0511 11/12/20 0436 11/14/20 0033 11/16/20 0030 11/18/20 0122 11/20/20 0227  HGB 8.6* 8.9* 9.9* 8.9* 8.5* 8.3* 9.6* 8.8* 9.3* 9.3*  -Continue Vitamin B12 daily  Essential hypertension: Normotensive for most part -Continue amlodipine 10 mg p.o. daily -Continue carvedilol 25 mg p.o. twice daily -Decrease clonidine to 0.1 mg twice daily. -Discontinue hydralazine. -Change as needed hydralazine to as needed labetalol  Sinus tachycardia: Unclear etiology.  TTE and TSH within normal.  Does not appear dehydrated. -Cardiac meds as above -Continue IV fluid  Elevated AST: Likely due to rhabdo. -Monitor  Hypomagnesemia: Resolved  Weakness/deconditioning/debility. -PT/OT recommended CIR but CIR recommended SNF  Chest pain: Work-up including CXR, troponin, BNP and EKG unrevealing.  Chest pain resolved. -Continue PPI  Disposition: Patient asking to leave but not medically ready.  Extensively discussed about this.  Psychiatry consulted and deemed him to have medical decision-making capacity.  However, he did not voice to leave when psychiatry talk to him.  Therapy recommended SNF but he could be released home with home health if he does not want to go to SNF once medically stable.  Nutrition: Body mass index is 29.83 kg/m. Nutrition Problem: Increased nutrient needs Etiology: acute illness Signs/Symptoms: estimated needs Interventions: Ensure Enlive (each supplement provides 350kcal and 20 grams of protein),MVI   DVT prophylaxis:  SCDs Start: 10/27/20 0610 apixaban (ELIQUIS) tablet 10 mg  apixaban (ELIQUIS) tablet 5 mg  Code Status: Full code Family Communication: Patient and/or  RN. Available if any question.  Level of care: Progressive Status is: Inpatient  Remains inpatient appropriate because:Hemodynamically unstable, Ongoing diagnostic testing needed not appropriate for outpatient work up, IV treatments appropriate due to intensity of illness or inability to take PO and Inpatient level of care appropriate due to severity of illness   Dispo:  Patient From: Home  Planned Disposition: SNF  Medically stable for discharge: Yes         Consultants:   PCCM  Nephrology-signed off  Psychiatry   Neurology-signed off    Sch Meds:  Scheduled Meds: . amLODipine  10 mg Oral Daily  . apixaban  10 mg Oral BID   Followed by  . [START ON 11/22/2020] apixaban  5 mg Oral BID  . carvedilol  25 mg Oral BID WC  . chlorhexidine  15 mL Mouth Rinse BID  . Chlorhexidine Gluconate Cloth  6 each Topical Q0600  . cloNIDine  0.1 mg Oral BID  . docusate sodium  100 mg Oral BID  . feeding supplement  237 mL Oral BID BM  . mouth rinse  15 mL Mouth Rinse q12n4p  . multivitamin with minerals  1 tablet Oral Daily  . pantoprazole  40 mg Oral QHS  . polyethylene glycol  17 g Oral Daily  . prazosin  4 mg Oral QHS  . senna  1 tablet Oral BID  .  sertraline  50 mg Oral QHS  . sodium chloride flush  10-40 mL Intracatheter Q12H  . sodium chloride flush  10-40 mL Intracatheter Q12H  . [START ON 11/21/2020] traZODone  50 mg Oral QHS  . vitamin B-12  1,000 mcg Oral Daily   Continuous Infusions: . sodium chloride Stopped (11/08/20 1130)  . lactated ringers 125 mL/hr at 11/20/20 0848   PRN Meds:.sodium chloride, acetaminophen, clonazepam, haloperidol lactate, hydrALAZINE, HYDROmorphone (DILAUDID) injection, ondansetron (ZOFRAN) IV, polyethylene glycol, sodium chloride flush, sodium chloride flush  Antimicrobials: Anti-infectives (From admission, onward)   Start     Dose/Rate Route Frequency Ordered Stop   11/11/20 1400  ceFEPIme (MAXIPIME) 2 g in sodium chloride 0.9 % 100 mL  IVPB        2 g 200 mL/hr over 30 Minutes Intravenous Every 8 hours 11/11/20 1016 11/15/20 2124   11/09/20 1645  ceFEPIme (MAXIPIME) 2 g in sodium chloride 0.9 % 100 mL IVPB  Status:  Discontinued        2 g 200 mL/hr over 30 Minutes Intravenous Every 12 hours 11/09/20 1555 11/11/20 1016   11/06/20 1145  cefTRIAXone (ROCEPHIN) 2 g in sodium chloride 0.9 % 100 mL IVPB        2 g 200 mL/hr over 30 Minutes Intravenous Every 24 hours 11/06/20 1052 11/08/20 1202   11/06/20 1145  metroNIDAZOLE (FLAGYL) IVPB 500 mg  Status:  Discontinued        500 mg 100 mL/hr over 60 Minutes Intravenous Every 8 hours 11/06/20 1052 11/07/20 0901   11/01/20 2200  cefTRIAXone (ROCEPHIN) 2 g in sodium chloride 0.9 % 100 mL IVPB        2 g 200 mL/hr over 30 Minutes Intravenous Every 24 hours 11/01/20 0807 11/02/20 2240   10/31/20 1223  vancomycin variable dose per unstable renal function (pharmacist dosing)  Status:  Discontinued         Does not apply See admin instructions 10/31/20 1223 11/01/20 0807   10/31/20 0945  cefTRIAXone (ROCEPHIN) 2 g in sodium chloride 0.9 % 100 mL IVPB  Status:  Discontinued        2 g 200 mL/hr over 30 Minutes Intravenous Every 12 hours 10/31/20 0847 11/01/20 0807   10/29/20 2345  vancomycin (VANCOREADY) IVPB 2000 mg/400 mL        2,000 mg 200 mL/hr over 120 Minutes Intravenous  Once 10/29/20 2259 10/30/20 1237   10/29/20 2300  ceFAZolin (ANCEF) IVPB 1 g/50 mL premix  Status:  Discontinued        1 g 100 mL/hr over 30 Minutes Intravenous Every 12 hours 10/29/20 2212 10/31/20 0845   10/29/20 2259  vancomycin variable dose per unstable renal function (pharmacist dosing)  Status:  Discontinued         Does not apply See admin instructions 10/29/20 2259 10/30/20 1021   10/27/20 0815  cefTRIAXone (ROCEPHIN) 2 g in sodium chloride 0.9 % 100 mL IVPB  Status:  Discontinued        2 g 200 mL/hr over 30 Minutes Intravenous Every 24 hours 10/27/20 0801 10/29/20 2259   10/27/20 0815   azithromycin (ZITHROMAX) 500 mg in sodium chloride 0.9 % 250 mL IVPB  Status:  Discontinued        500 mg 250 mL/hr over 60 Minutes Intravenous Every 24 hours 10/27/20 0801 10/28/20 0757       I have personally reviewed the following labs and images: CBC: Recent Labs  Lab 11/14/20 0033 11/16/20 0030  11/18/20 0122 11/20/20 0227  WBC 10.5 8.6 7.5 8.6  HGB 9.6* 8.8* 9.3* 9.3*  HCT 29.9* 27.5* 28.9* 28.5*  MCV 96.5 96.8 94.4 94.4  PLT 561* 506* 563* 589*   BMP &GFR Recent Labs  Lab 11/14/20 0918 11/14/20 1906 11/16/20 0030 11/17/20 0313 11/18/20 0122 11/19/20 0127 11/20/20 0227  NA  --    < > 141 139 138 139 136  K  --    < > 3.7 3.5 3.9 4.1 3.8  CL  --    < > 111 109 104 106 101  CO2  --    < > 22 22 24 23 24   GLUCOSE  --    < > 118* 104* 113* 110* 110*  BUN  --    < > 20 17 15 17 13   CREATININE  --    < > 1.22 1.17 1.14 1.17 1.13  CALCIUM  --    < > 8.7* 9.0 9.3 9.4 9.3  MG 1.9  --   --  1.6* 1.8  --  1.7  PHOS  --   --   --   --   --   --  3.8   < > = values in this interval not displayed.   Estimated Creatinine Clearance: 98.2 mL/min (by C-G formula based on SCr of 1.13 mg/dL). Liver & Pancreas: Recent Labs  Lab 11/14/20 0033 11/15/20 0157 11/16/20 0030 11/20/20 0227  AST 32 60* 58* 90*  ALT 19 23 23  34  ALKPHOS 62 67 57 62  BILITOT 0.5 0.8 0.9 0.3  PROT 6.8 7.3 6.9 7.4  ALBUMIN 3.2* 3.4* 3.2* 3.6   No results for input(s): LIPASE, AMYLASE in the last 168 hours. No results for input(s): AMMONIA in the last 168 hours. Diabetic: No results for input(s): HGBA1C in the last 72 hours. Recent Labs  Lab 11/17/20 1940 11/17/20 2318 11/18/20 0321 11/18/20 0801 11/18/20 1104  GLUCAP 120* 108* 106* 133* 123*   Cardiac Enzymes: Recent Labs  Lab 11/14/20 0033 11/17/20 0313 11/19/20 0127 11/20/20 0227  CKTOTAL 560* 1,685* 1,665* 1,920*   No results for input(s): PROBNP in the last 8760 hours. Coagulation Profile: No results for input(s): INR,  PROTIME in the last 168 hours. Thyroid Function Tests: Recent Labs    11/20/20 0227  TSH 1.117   Lipid Profile: No results for input(s): CHOL, HDL, LDLCALC, TRIG, CHOLHDL, LDLDIRECT in the last 72 hours. Anemia Panel: Recent Labs    11/20/20 0227  VITAMINB12 568  FOLATE 14.3  FERRITIN 209  TIBC 288  IRON 22*  RETICCTPCT 2.4   Urine analysis:    Component Value Date/Time   COLORURINE YELLOW 11/07/2020 1445   APPEARANCEUR CLEAR 11/07/2020 1445   LABSPEC 1.011 11/07/2020 1445   PHURINE 6.0 11/07/2020 1445   GLUCOSEU NEGATIVE 11/07/2020 1445   HGBUR SMALL (A) 11/07/2020 1445   BILIRUBINUR NEGATIVE 11/07/2020 1445   KETONESUR 20 (A) 11/07/2020 1445   PROTEINUR NEGATIVE 11/07/2020 1445   NITRITE NEGATIVE 11/07/2020 1445   LEUKOCYTESUR NEGATIVE 11/07/2020 1445   Sepsis Labs: Invalid input(s): PROCALCITONIN, LACTICIDVEN  Microbiology: No results found for this or any previous visit (from the past 240 hour(s)).  Radiology Studies: DG Chest Port 1 View  Result Date: 11/19/2020 CLINICAL DATA:  Chest pain. EXAM: PORTABLE CHEST 1 VIEW COMPARISON:  11/11/2020 radiograph and prior studies FINDINGS: UPPER limits normal heart size noted. There is no evidence of focal airspace disease, pulmonary edema, suspicious pulmonary nodule/mass, pleural effusion, or pneumothorax. No  acute bony abnormalities are identified. IMPRESSION: UPPER limits normal heart size without evidence of acute cardiopulmonary disease. Electronically Signed   By: Harmon Pier M.D.   On: 11/19/2020 16:13      Taye T[facility]  If 7PM-7AM, please contact night-coverage [email or web address] 11/20/2020, 1:54 PM

## 2020-11-20 NOTE — Progress Notes (Signed)
   11/20/20 0800  Assess: MEWS Score  Temp 98.5 F (36.9 C)  BP 111/72  ECG Heart Rate (!) 119  Level of Consciousness Alert  SpO2 97 %  O2 Device Room Air  Assess: MEWS Score  MEWS Temp 0  MEWS Systolic 0  MEWS Pulse 2  MEWS RR 0  MEWS LOC 0  MEWS Score 2  MEWS Score Color Yellow  Assess: if the MEWS score is Yellow or Red  Were vital signs taken at a resting state? Yes  Focused Assessment No change from prior assessment  Early Detection of Sepsis Score *See Row Information* Low  MEWS guidelines implemented *See Row Information* Yes  Treat  MEWS Interventions Escalated (See documentation below)  Take Vital Signs  Increase Vital Sign Frequency  Yellow: Q 2hr X 2 then Q 4hr X 2, if remains yellow, continue Q 4hrs  Escalate  MEWS: Escalate Yellow: discuss with charge nurse/RN and consider discussing with provider and RRT  Notify: Charge Nurse/RN  Name of Charge Nurse/RN Notified Erin, RN  Date Charge Nurse/RN Notified 11/19/20  Time Charge Nurse/RN Notified 0830

## 2020-11-20 NOTE — Consult Note (Signed)
[facility]   Reason for Consult: Capacity  Referring Physician: Dr Alanda SlimGonfa  Patient Identification: Andrew [name] MRN:  [id number] Principal Diagnosis: Acute on chronic respiratory failure with hypoxia [facility](HCC) Diagnosis:  Principal Problem:   Acute on chronic respiratory failure with hypoxia (HCC) Active Problems:   PTSD (post-traumatic stress disorder)   Severe episode of recurrent major depressive disorder, with psychotic features (HCC)   Generalized anxiety disorder   AKI (acute kidney injury) (HCC)   Encounter for central line placement   Abdominal distention   Tachycardia   Primary hypertension   Ileus (HCC)   Acute respiratory failure with hypoxemia (HCC)   Total Time spent with patient: 20 minutes  Patient seen and evaluated in person by this provider.  He reports he was admitted for chest pain and is now able to care for himself.  States his MD is concerned because his "heart rate is fast."  No suicidal/homicidal ideations, hallucinations, or substance abuse issues.  His trach has been removed, CK levels are still elevated at 1920 today vs 1665 yesterday.  Clear and coherent, alert and oriented times four.  He can follow directions to turn down his television and follows the conversation without issues.  He states he lives with his fiance and daughter.  Assessed what he would do if his chest pain returned or if he fell and he stated [facility].  Reports his fiance is always there and he would call her for assistance to help him if this occurred or call 911.  He does have capacity to make his own medical decisions at this time.  Client does not voice wanting to leave and his RN confirmed his physical abilities, no discrepancies noted.   Last consult: Andrew ProctorCornell J Cashwell is a 40 y.o. male admitted on 10/26/2020 with chest pain and acute hyperactive delirium requiring mechanical ventilation.  Subsequently found to be in rhabdomyolysis with  AKI requiring hemodialysis.  UDS negative*2, MRI negative.  Extubated on 3/14 and reintubated for the third time 3/16.  Tracheostomy on 3/18.  Seen by psychiatry twice during this hospitalization 10/28/20 and 11/05/2020 respectively.  Last evaluation it was recommended to consider checking CK level daily and not start Seroquel or any other antipsychotic until CK total level is below 300 as considering rhabdomyolysis may have been induced by antipsychotic.  On 10/27/2020 CK total was 141, next day on 3/4 increased to 32, 777 normalizing on 11/08/2020.  Psychiatry is reconsulted for medication management. Today patient denies suicidal or homicidal ideations.  It is difficult to do a complete psychiatric evaluation due to ongoing medical conditions but patient able to nod head.  He admits to auditory and visual hallucinations but denies any commands to harm himself or anybody around him.  Past Psychiatric History: PTSD, generalized anxiety disorder, major depressive disorder, hallucination either due to severe PTSD or MDD with psychosis  Risk to Self:  No Risk to Others:  No Prior Inpatient Therapy:  No Prior Outpatient Therapy:  Yes  Past Medical History: History reviewed. No pertinent past medical history. History reviewed. No pertinent surgical history. Family History: History reviewed. No pertinent family history. Family Psychiatric  History: Patient's father has PTSD Social History: Patient lives with girlfriend and 42-year-old daughter from other girlfriend.    Allergies:  No Known Allergies  Labs:  CBC Latest Ref Rng & Units 11/20/2020 11/18/2020 11/16/2020  WBC 4.0 - 10.5 K/uL 8.6 7.5 8.6  Hemoglobin 13.0 - 17.0 g/dL  9.3(L) 9.3(L) 8.8(L)  Hematocrit 39.0 - 52.0 % 28.5(L) 28.9(L) 27.5(L)  Platelets 150 - 400 K/uL 589(H) 563(H) 506(H)   BMP Latest Ref Rng & Units 11/20/2020 11/19/2020 11/18/2020  Glucose 70 - 99 mg/dL 354(S) 568(L) 275(T)  BUN 6 - 20 mg/dL 13 17 15   Creatinine 0.61 - 1.24 mg/dL  7.00 1.74  Sodium 135 - 145 mmol/L 136 139 138  Potassium 3.5 - 5.1 mmol/L 3.8 4.1 3.9  Chloride 98 - 111 mmol/L 101 106 104  CO2 22 - 32 mmol/L 24 23 24   Calcium 8.9 - 10.3 mg/dL 9.3 9.4 9.3    Current Facility-Administered Medications  Medication Dose Route Frequency Provider Last Rate Last Admin  . 0.9 %  sodium chloride infusion   Intravenous PRN 9.44 P, DO   Paused at 11/08/20 1130  . acetaminophen (TYLENOL) tablet 650 mg  650 mg Oral Q6H PRN Karie Fetch, RPH   650 mg at 11/16/20 1657  . amLODipine (NORVASC) tablet 10 mg  10 mg Oral Daily Quenton Fetter T, MD   10 mg at 11/20/20 0857  . apixaban (ELIQUIS) tablet 10 mg  10 mg Oral BID Candelaria Stagers, MD   10 mg at 11/20/20 0858   Followed by  . [START ON 11/22/2020] apixaban (ELIQUIS) tablet 5 mg  5 mg Oral BID 11/22/20, MD      . carvedilol (COREG) tablet 25 mg  25 mg Oral BID WC Opyd, 11/24/2020, MD   25 mg at 11/20/20 0857  . chlorhexidine (PERIDEX) 0.12 % solution 15 mL  15 mL Mouth Rinse BID Lavone Neri, Eric J, DO   15 mL at 11/20/20 0858  . Chlorhexidine Gluconate Cloth 2 % PADS 6 each  6 each Topical Q0600 [place], MD   6 each at 11/20/20 1142  . clonazePAM (KLONOPIN) disintegrating tablet 1 mg  1 mg Oral BID PRN Coralyn Helling, RPH   1 mg at 11/19/20 2203  . cloNIDine (CATAPRES) tablet 0.1 mg  0.1 mg Oral BID 11/21/20 T, MD      . docusate sodium (COLACE) capsule 100 mg  100 mg Oral BID 2204 B, RPH   100 mg at 11/18/20 Link Snuffer  . feeding supplement (ENSURE ENLIVE / ENSURE PLUS) liquid 237 mL  237 mL Oral BID BM 11/20/20, Eric J, DO   237 mL at 11/20/20 1007  . haloperidol lactate (HALDOL) injection 5 mg  5 mg Intravenous Q4H PRN [place], MD   5 mg at 11/16/20 0158  . hydrALAZINE (APRESOLINE) injection 10-20 mg  10-20 mg Intravenous Q4H PRN Stretch, 11/18/20, MD   20 mg at 11/13/20 0434  . HYDROmorphone (DILAUDID) injection 0.5-1 mg  0.5-1 mg Intravenous Q2H PRN Marveen Reeks, MD   0.5 mg at  11/17/20 1951  . lactated ringers infusion   Intravenous Continuous Steffanie Rainwater, MD 125 mL/hr at 11/20/20 0848 Rate Change at 11/20/20 0848  . MEDLINE mouth rinse  15 mL Mouth Rinse q12n4p 11/22/20, Eric J, DO   15 mL at 11/20/20 1104  . multivitamin with minerals tablet 1 tablet  1 tablet Oral Daily [place], Eric J, DO   1 tablet at 11/20/20 0857  . ondansetron (ZOFRAN) injection 4 mg  4 mg Intravenous Q6H PRN [place], MD   4 mg at 11/16/20 1003  . pantoprazole (PROTONIX) EC tablet 40 mg  40 mg Oral QHS Kirtland Bouchard B, RPH   40 mg at 11/19/20  2201  . polyethylene glycol (MIRALAX / GLYCOLAX) packet 17 g  17 g Oral Daily Link Snuffer B, RPH   17 g at 11/18/20 4540  . polyethylene glycol (MIRALAX / GLYCOLAX) packet 17 g  17 g Oral Daily PRN Quenton Fetter, RPH      . prazosin (MINIPRESS) capsule 4 mg  4 mg Oral QHS Link Snuffer B, RPH   4 mg at 11/19/20 2201  . senna (SENOKOT) tablet 8.6 mg  1 tablet Oral BID Link Snuffer B, RPH   8.6 mg at 11/18/20 9811  . sertraline (ZOLOFT) tablet 50 mg  50 mg Oral QHS Dagar, Geralynn Rile, MD   50 mg at 11/19/20 2201  . sodium chloride flush (NS) 0.9 % injection 10-40 mL  10-40 mL Intracatheter Q12H Coralyn Helling, MD   10 mL at 11/19/20 0926  . sodium chloride flush (NS) 0.9 % injection 10-40 mL  10-40 mL Intracatheter PRN Coralyn Helling, MD      . sodium chloride flush (NS) 0.9 % injection 10-40 mL  10-40 mL Intracatheter Q12H Briant Sites, DO   10 mL at 11/19/20 2202  . sodium chloride flush (NS) 0.9 % injection 10-40 mL  10-40 mL Intracatheter PRN Briant Sites, DO      . [START ON 11/21/2020] traZODone (DESYREL) tablet 50 mg  50 mg Oral QHS Gonfa, Taye T, MD      . vitamin B-12 (CYANOCOBALAMIN) tablet 1,000 mcg  1,000 mcg Oral Daily Quenton Fetter, RPH   1,000 mcg at 11/20/20 9147    Musculoskeletal: Strength & Muscle Tone: within normal limits Gait & Station: Unable to evaluate due to patient's ongoing medical  condition Patient leans: N/A  Psychiatric Specialty Exam: Physical Exam Vitals and nursing note reviewed.  HENT:     Head: Normocephalic and atraumatic.  Cardiovascular:     Rate and Rhythm: Tachycardia present.  Musculoskeletal:     Cervical back: Normal range of motion.  Neurological:     General: No focal deficit present.     Mental Status: He is oriented to person, place, and time.  Psychiatric:        Attention and Perception: Attention normal.        Mood and Affect: Mood is depressed. Affect is blunt.        Speech: Speech normal.        Behavior: Behavior normal. Behavior is cooperative.        Thought Content: Thought content normal.        Cognition and Memory: Cognition and memory normal.        Judgment: Judgment normal.     Review of Systems  Psychiatric/Behavioral: Positive for dysphoric mood.  All other systems reviewed and are negative.   Blood pressure 117/69, pulse (!) 106, temperature 97.8 F (36.6 C), temperature source Oral, resp. rate 15, height 5\' 10"  (1.778 m), weight 94.3 kg, SpO2 97 %.Body mass index is 29.83 kg/m.  General Appearance: Casual  Eye Contact:  Good  Speech:  WDL  Volume:  Normal   Mood:  Depressed  Affect:  Blunted  Thought Process:  Clear and coherent  Orientation:  Full (Time, Place, and Person)  Thought Content:  WDL  Suicidal Thoughts:  No  Homicidal Thoughts:  No  Memory:  Fair to good  Judgement:  Fair  Insight:  Fair  Psychomotor Activity:  Decreased  Concentration:  Concentration: Fair and Attention Span: Fair  Recall:  of Knowledge:  Good  Language:  Good  Akathisia:  No  Handed:  Right  AIMS (if indicated):     Assets:  Desire for Improvement Resilience Social Support  ADL's:  WDL  Cognition:  WNL  Sleep:      Assessment: 40 year old male presented with chest pain and admitted for acute respiratory failure, rhabdomyolysis and acute hyperactive delirium.  He has improved and the trach was removed,  transferred to another unit.  He is taking care of his ADLs, confirmed with his RN Marijo File).  Treatment Plan: Major depressive disorder, recurrent, moderate --Continue Zoloft 50 mg for anxiety and depression  Nightmares  --Continue prazosin 4 mg nightly to address his PTSD as it might be causing nightmares/psychosis.  Insomnia: -Continue Trazodone 50 mg daily at bedtime  Nanine Means, NP 11/20/2020 1:22 PM  PGY-1, Resident

## 2020-11-20 NOTE — Progress Notes (Signed)
S: Pt denies acute needs, tolerating diet   O: Blood pressure 117/74, pulse (!) 110, temperature 98.9 F (37.2 C), temperature source Oral, resp. rate 16, height 5\' 10"  (1.778 m), weight 94.3 kg, SpO2 98 %.  General: adult male lying in bed watching TV, NAD HEENT: MM pink/moist, trach stoma covered c/d/i, good phonation / strong voice Neuro: AAOx4, speech clear, MAE PSY: calm CV: s1s2 RRR, no m/r/g PULM: non-labored on RA, lungs bilaterally clear GI: soft, bsx4 active  Extremities: warm/dry, no edema  Skin: no rashes or lesions   A: Recurrent Respiratory Failure in setting of Aspiration Pneumonitis, Polysubstance Abuse, Rhabdomyolysis superimposed on underlying psychiatric disorder / PTSD / GAD.  S/P Tracheostomy due to above, decanulated 3/26  Rhabdomyolysis   P: Supportive care  Keep stoma clean / dry, cover with band aide until closed  Pt not to submerge in water until stoma closed. Ok to shower.   Pulmonary hygiene - IS, mobilize  Consider sending myosytis panel, aldolase given persistent CK.  Initially thought to be medication related.     PCCM will sign off. Please call back if new needs arise.    4/26, MSN, APRN, NP-C, AGACNP-BC [facility] 11/20/2020, 6:16 PM   Please see [email or web address] for pager details.   From 7A-7P if no response, please call [phone] After hours, please call ELink [phone]

## 2020-11-21 LAB — GLUCOSE, CAPILLARY
Glucose-Capillary: 101 mg/dL — ABNORMAL HIGH (ref 70–99)
Glucose-Capillary: 107 mg/dL — ABNORMAL HIGH (ref 70–99)

## 2020-11-21 LAB — LACTATE DEHYDROGENASE: LDH: 280 U/L — ABNORMAL HIGH (ref 98–192)

## 2020-11-21 LAB — SEDIMENTATION RATE: Sed Rate: 62 mm/hr — ABNORMAL HIGH (ref 0–16)

## 2020-11-21 LAB — C-REACTIVE PROTEIN: CRP: 3.9 mg/dL — ABNORMAL HIGH (ref <1.0)

## 2020-11-21 LAB — CK: Total CK: 1808 U/L — ABNORMAL HIGH (ref 49–397)

## 2020-11-21 MED ORDER — SODIUM CHLORIDE 0.9 % IV SOLN: INTRAVENOUS | Status: DC

## 2020-11-21 MED ORDER — SODIUM CHLORIDE 0.9 % IV SOLN
25.0000 mg | Freq: Once | INTRAVENOUS | Status: AC
  Administered 2020-11-21: 25 mg via INTRAVENOUS
  Filled 2020-11-21: qty 0.5

## 2020-11-21 MED ORDER — SODIUM CHLORIDE 0.9 % IV SOLN
500.0000 mg | Freq: Once | INTRAVENOUS | Status: AC
  Administered 2020-11-21: 500 mg via INTRAVENOUS
  Filled 2020-11-21: qty 10

## 2020-11-21 NOTE — Progress Notes (Signed)
Inpatient Rehabilitation Admissions Coordinator  I met with patient at bedside working with therapy. Patient has progressed well with therapy and no longer in need of intensive inpatient rehab. Recommend home with HH. We will sign off. I will alert acute team and TOC.  Danne Baxter, RN, MSN Rehab Admissions Coordinator [phone] 11/21/2020 11:42 AM

## 2020-11-21 NOTE — Progress Notes (Addendum)
[facility] PROGRESS NOTE  Andrew [name] HWE:[id number] DOB: March 13, 1981 DOA: 10/26/2020 PCP: Reeves Dam, MD  Status: Remains inpatient appropriate because:Unsafe d/c plan and Inpatient level of care appropriate due to severity of illness   Dispo:  Patient From: Home  Planned Disposition: Home  Medically stable for discharge: NO-  BARRIERS to DC: Need to normalize CK levels before discharge.  Also VA benefits not applicable to current hospitalization due to not notifying the [place] system within 72 hours of admission.             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  During ED evaluation he became combative, refusing care and notably tachycardic with heart rates of 280 with a temperature of 102.5.  Patient was emergently intubated for airway protection and admitted to the PCCM service.  Patient was found to have rhabdomyolysis, acute renal failure requiring HD.  There was concern for Wellbutrin and Seroquel contributing to this.  Several attempts at extubation were made that were unsuccessful and ultimately patient underwent tracheostomy on 11/11/2020.  Patient was transferred from [facility] to [facility] on 11/16/2020.  Subjective: .  Patient denies excessive sleepiness since the initiation of trazodone.  Explained to patient that he needed to stay in the hospital while we try to normalize his CK levels.  Also explained to patient he may have to go home on trazodone if CK levels remain even slightly elevated at discharge.  Objective: Vitals:   11/21/20 0010 11/21/20 0729  BP: 119/74 128/88  Pulse: (!) 110 98  Resp: 16 18  Temp: 98.4 F (36.9 C) 98 F (36.7 C)  SpO2: 96%  95%    Intake/Output Summary (Last 24 hours) at 11/21/2020 8938 Last data filed at 11/20/2020 2303 Gross per 24 hour  Intake 2304.42 ml  Output 250 ml  Net 2054.42 ml   Filed Weights   11/17/20 0500 11/18/20 0329 11/19/20 0329  Weight: 95.4 kg 91.2 kg 94.3 kg    Exam:  Constitutional: Awake and drowsy but in no acute distress. Respiratory: Decannulated-stoma intact; lungs are clear to auscultation and he is on room air Cardiovascular: Heart sounds are S1-S2, no JVD, no peripheral edema, pulse regular Abdomen:  LBM 3/27, soft and nontender.  Bowel sounds present. Musculoskeletal: no clubbing / cyanosis. No joint deformity upper and lower extremities. Good ROM, no contractures. Normal muscle tone.  Neurologic: CN 2-12 grossly intact. Sensation intact, DTR normal. Strength 5/5 x all 4 extremities.  Psychiatric: Alert and oriented.  Sleepy today.   Assessment/Plan: Acute problems: Acute hypoxemic respiratory failure s/p trach; decannulated 3/26 -Presented to the ED with chest pain, became belligerent with elevated heart rate and respiratory distress and was subsequently intubated for airway protection.  Patient failed extubation x2 in the intensive care unit and ultimately had a tracheostomy placed on 11/11/2020. --Difficulty with trach on 3/26.  Unable to suction and patient breathing around trach.  PCCM made decision to decannulate. -As of 3/28 remained stable on room air  Acute metabolic encephalopathy due to hypoactive delirium: Resolved Hx PTSD, GAD, MDD Patient with acute psychosis on admission.  On multiple psychoactive medications to include Seroquel and Wellbutrin outpatient.  UDS and head CT negative on admission.  Denies history of drug abuse but family suspects possible use.  Initially requiring Precedex infusion, which has been titrated off. --Psychiatry following, appreciate assistance --Zoloft 50 mg p.o. nightly --Prazosin 4 mg p.o. daily --Klonopin 1 mg p.o. twice  daily as needed anxiety --Haldol as needed agitation *Unable to resume Seroquel until CK normalized-3/25 initiated trazodone 50 mg HS  Acute renal failure/Persistent low-grade rhabdomyolysis Creatinine 0.99 on admission, trended up to a high of 11.94 during hospitalization. CK high of 41K -down to 1685 on 3/24 and 3/27 1808 therefore will need to be continued on lactated Ringer's at 125 cc/h.  We will follow labs frequently. -CK > 1500 despite washout of Seroquel and normalization of renal Function. Need to r/o inflammatory myositis so wil check the following labs: Acetylcholine receptor binding, Aldolase, CRP, ESR, EBV panel and LDH.Recent LFTs are normal -ARF likely secondary to rhabdomyolysis/pigment nephropathy from medications use versus questionable shoulder injury.   -Renal ultrasound shows right kidney normal in appearance, visualization left kidney limited with no abnormality seen and bladder decompressed.   -Nephrology was consulted and patient underwent HD with improvement of renal function.  Hemodialysis was discontinued and nephrology signed off on 11/09/2020. --Cr 0.99>>11.94>>1.22>1.17 --Avoid nephrotoxins, renal dose all medications  Anemia with iron deficiency -Hemoglobin 9.3 with MCV 94.4.  Iron 22 saturation 8 otherwise anemia panel unremarkable -We will go ahead and give IV iron test dose followed by full dose at 500 mg -We will need repeat anemia panel in 6 weeks.  Bilateral gastrocnemius DVTs/Suspected PE TTE 11/07/2020 with no RV heart strain appreciated.  Normal LVEF without LV regional wall motion abnormalities. -Continue Eliquis  Weakness/deconditioning/debility. --As of 3/28 he is mobilizing well with minimal assistance and no longer meets requirement for additional rehabilitative therapies at CIR -current recommendation is for discharge to home with home health PT  Dysphagia -Resolved -Regular diet    Other problems: Multilobar pneumonia, aspiration  pneumonitis --Completed 7-day course of cefepime  Anemia secondary to critical illness Hemoglobin 8.8, stable. --Vitamin B12 daily  Essential hypertension BP 153/100 --Amlodipine 10 mg p.o. daily --Carvedilol 25 mg p.o. twice daily --Clonidine 0.1 mg p.o. 3 times daily --Hydralazine 50 mg p.o. every 6 hours  Hypomagnesemia Magnesium 1.6, will replete. --Repeat electrolytes in a.m.   Data Reviewed: Basic Metabolic Panel: Recent Labs  Lab 11/14/20 0918 11/14/20 1906 11/16/20 0030 11/17/20 0313 11/18/20 0122 11/19/20 0127 11/20/20 0227  NA  --    < > 141 139 138 139 136  K  --    < > 3.7 3.5 3.9 4.1 3.8  CL  --    < > 111 109 104 106 101  CO2  --    < > 22 22 24 23 24   GLUCOSE  --    < > 118* 104* 113* 110* 110*  BUN  --    < > 20 17 15 17 13   CREATININE  --    < > 1.22 1.17 1.14 1.17 1.13  CALCIUM  --    < > 8.7* 9.0 9.3 9.4 9.3  MG 1.9  --   --  1.6* 1.8  --  1.7  PHOS  --   --   --   --   --   --  3.8   < > = values in this interval not displayed.   Liver Function Tests: Recent Labs  Lab 11/15/20 0157 11/16/20 0030 11/20/20 0227  AST 60* 58* 90*  ALT 23 23 34  ALKPHOS 67 57 62  BILITOT 0.8 0.9 0.3  PROT 7.3 6.9 7.4  ALBUMIN 3.4* 3.2* 3.6   No results for input(s): LIPASE, AMYLASE in the last 168 hours. No results for input(s): AMMONIA in the last 168 hours. CBC: Recent Labs  Lab 11/16/20 0030 11/18/20 0122 11/20/20 0227  WBC 8.6 7.5 8.6  HGB 8.8* 9.3* 9.3*  HCT 27.5* 28.9* 28.5*  MCV 96.8 94.4 94.4  PLT 506* 563* 589*   Cardiac Enzymes: Recent Labs  Lab 11/17/20 0313 11/19/20 0127 11/20/20 0227 11/21/20 0241  CKTOTAL 1,685* 1,665* 1,920* 1,808*   BNP (last 3 results) Recent Labs    11/19/20 1558  BNP 11.4    ProBNP (last 3 results) No results for input(s): PROBNP in the last 8760 hours.  CBG: Recent Labs  Lab 11/18/20 0321 11/18/20 0801 11/18/20 1104 11/21/20 0249 11/21/20 0729  GLUCAP 106* 133* 123* 101* 107*     No results found for this or any previous visit (from the past 240 hour(s)).   Studies: DG Chest Port 1 View  Result Date: 11/19/2020 CLINICAL DATA:  Chest pain. EXAM: PORTABLE CHEST 1 VIEW COMPARISON:  11/11/2020 radiograph and prior studies FINDINGS: UPPER limits normal heart size noted. There is no evidence of focal airspace disease, pulmonary edema, suspicious pulmonary nodule/mass, pleural effusion, or pneumothorax. No acute bony abnormalities are identified. IMPRESSION: UPPER limits normal heart size without evidence of acute cardiopulmonary disease. Electronically Signed   By: Margarette Canada M.D.   On: 11/19/2020 16:13    Scheduled Meds: . amLODipine  10 mg Oral Daily  . apixaban  10 mg Oral BID   Followed by  . [START ON 11/22/2020] apixaban  5 mg Oral BID  . carvedilol  25 mg Oral BID WC  . chlorhexidine  15 mL Mouth Rinse BID  . Chlorhexidine Gluconate Cloth  6 each Topical Q0600  . cloNIDine  0.1 mg Oral BID  . docusate sodium  100 mg Oral BID  . feeding supplement  237 mL Oral BID BM  . mouth rinse  15 mL Mouth Rinse q12n4p  . multivitamin with minerals  1 tablet Oral Daily  . pantoprazole  40 mg Oral QHS  . polyethylene glycol  17 g Oral Daily  . prazosin  4 mg Oral QHS  . senna  1 tablet Oral BID  . sertraline  50 mg Oral QHS  . sodium chloride flush  10-40 mL Intracatheter Q12H  . sodium chloride flush  10-40 mL Intracatheter Q12H  . traZODone  50 mg Oral QHS  . vitamin B-12  1,000 mcg Oral Daily   Continuous Infusions: . sodium chloride Stopped (11/08/20 1130)  . sodium chloride    . iron dextran (INFED/DEXFERRUM) infusion     Followed by  . iron dextran (INFED/DEXFERRUM) infusion    . lactated ringers 125 mL/hr at 11/20/20 2303    Principal Problem:   Acute on chronic respiratory failure with hypoxia (HCC) Active Problems:   PTSD (post-traumatic stress disorder)   Severe episode of recurrent major depressive disorder, with psychotic features ([place])    Generalized anxiety disorder   AKI (acute kidney injury) ([place])   Encounter for central line placement   Abdominal distention   Tachycardia   Primary hypertension   Ileus ([place])   Acute respiratory failure with hypoxemia [facility])   Consultants:  PCCM  Nephrology  Psychiatry - signed off 3/24  Neurology   Procedures:  ETT 3/3 >>3/4  ETT 3/6 >> 3/14  HD catheter 3/9 >> 3/15  ETT 3/16 >>  Trach 3/18 >>   Antibiotics: Ceftriaxone 3/3 >> 3/6 Azithromycin 3/3 x 1 Cefazolin 3/6 >> 3/7 Ceftriaxone 3/7 >> 3/9 Vanc 3/6 X 1 dose Vanc 3/7 >> 3/8 Ceftriaxone 3/13 >> 3/15 Cefepime 3/16 >>   Time spent: 35 minutes    Erin Hearing ANP  [facility] 7 am - 330 pm/M-F for direct patient care and secure chat Please refer to Amion for contact info 25  days

## 2020-11-21 NOTE — Progress Notes (Signed)
Physical Therapy Treatment Patient Details Name: Andrew [name] MRN: [id number] DOB: 1981/04/20 Today's Date: 11/21/2020    History of Present Illness 40y/o male admitted 3/2 with two weeks of chest pain.  Pt was emergently intubated 3/2- 3/4. EEG suggested moderate, diffuse encephalopathy without seizure activity. Found to be in rhabdo with AKI. RUE pain--MRI shoulder:Edema in musculature about the shoulder. reintubated 3/6 due to hypoactive delirium for airway protection; 3/7 lumbar puncture. Extubation 3/14. Re-intubated 3/17.  Trach 3/18.  Decannulated 3/26. PMhx: PTSD, anxiety.    PT Comments    Pt with excellent progress with mobility. No longer needs CIR. Recommend HHPT. When pt asked if he felt like he was back to normal with mobility he stated, "No but I am good enough to go home." Feel pt with good assessment of his current mobility status.    Follow Up Recommendations  Home health PT;Supervision - Intermittent     Equipment Recommendations  None recommended by PT    Recommendations for Other Services       Precautions / Restrictions Precautions Precautions: None    Mobility  Bed Mobility Overal bed mobility: Independent                  Transfers Overall transfer level: Modified independent Equipment used: None Transfers: Sit to/from Stand Sit to Stand: Modified independent (Device/Increase time)         General transfer comment: No instability  Ambulation/Gait Ambulation/Gait assistance: Modified independent (Device/Increase time);Supervision Gait Distance (Feet): 700 Feet Assistive device: None Gait Pattern/deviations: Step-through pattern;Wide base of support Gait velocity: decr Gait velocity interpretation: >2.62 ft/sec, indicative of community ambulatory General Gait Details: Pt modified independent and avoiding all obstacles with gait in hallway. Supervision for higher level balance activities with some slight unsteadiness but no overt loss  of balance.   Stairs Stairs: Yes Stairs assistance: Supervision Stair Management: One rail Right;Step to pattern;Forwards Number of Stairs: 4 General stair comments: Used portable step x 4 with counter top to simulate rail   Wheelchair Mobility    Modified Rankin (Stroke Patients Only)       Balance Overall balance assessment: Needs assistance Sitting-balance support: Feet supported;No upper extremity supported Sitting balance-Leahy Scale: Normal     Standing balance support: No upper extremity supported;During functional activity Standing balance-Leahy Scale: Good               High level balance activites: Direction changes;Turns;Sudden stops;Head turns High Level Balance Comments: supervision with some slight unsteadiness but no overt loss of balance            Cognition Arousal/Alertness: Awake/alert Behavior During Therapy: Flat affect Overall Cognitive Status: Within Functional Limits for tasks assessed                                        Exercises      General Comments General comments (skin integrity, edema, etc.): Pt on RA without difficulty      Pertinent Vitals/Pain Faces Pain Scale: No hurt    Home Living                      Prior Function            PT Goals (current goals can now be found in the care plan section) Progress towards PT goals: Progressing toward goals    Frequency    Min 3X/week  PT Plan Discharge plan needs to be updated    Co-evaluation              AM-PAC PT "6 Clicks" Mobility   Outcome Measure  Help needed turning from your back to your side while in a flat bed without using bedrails?: None Help needed moving from lying on your back to sitting on the side of a flat bed without using bedrails?: None Help needed moving to and from a bed to a chair (including a wheelchair)?: None Help needed standing up from a chair using your arms (e.g., wheelchair or bedside  chair)?: None Help needed to walk in hospital room?: None Help needed climbing 3-5 steps with a railing? : A Little 6 Click Score: 23    End of Session Equipment Utilized During Treatment: Gait belt Activity Tolerance: Patient tolerated treatment well Patient left: with call bell/phone within reach;in chair Nurse Communication: Mobility status PT Visit Diagnosis: Other abnormalities of gait and mobility (R26.89);Muscle weakness (generalized) (M62.81);Pain     Time: 1104-1130 PT Time Calculation (min) (ACUTE ONLY): 26 min  Charges:  $Gait Training: 23-37 mins                     [facility] PT Acute Rehabilitation Services Pager [phone] Office [phone]    Angelina Ok [facility] 11/21/2020, 11:54 AM

## 2020-11-21 NOTE — Significant Event (Addendum)
Telemetry was alarming that pt was off leads so when this RN went to check in on pt, pt was half naked and urinating in a drinking cup. Urinal was beside pt and when this RN asked pt why he was peeing in a cup pt states that "I woke up suddenly and needed to pee really bad and the cup was the first thing I saw." Pt also had incontinence on bed and a full linen change was done as well as a bed bath given. Pt is alert and oriented x4 and VS are as follows: BP 119/74 (BP Location: Left Arm)   Pulse (!) 110   Temp 98.4 F (36.9 C) (Oral)   Resp 16   Ht 5\' 10"  (1.778 m)   Wt 94.3 kg   SpO2 96%   BMI 29.83 kg/m   A spot CBG check was done as well and shows 101. Covering MD Opyd paged. Will continue to monitor.

## 2020-11-21 NOTE — Progress Notes (Signed)
  Speech Language Pathology Treatment: Dysphagia  Patient Details Name: Andrew [name] MRN: [id number] DOB: [birth year] Today's Date: 11/21/2020 Time: 2637-8588 SLP Time Calculation (min) (ACUTE ONLY): 15.05 min  Assessment / Plan / Recommendation Clinical Impression  Pt was seen for dysphagia treatment and he was cooperative throughout the session. Pt and nursing reported that the pt has been tolerating the current diet without overt s/sx of aspiration. Pt tolerated regular texture solids, and thin liquids via straw using consecutive swallows without symptoms of oropharyngeal dysphagia. It is recommended that the current diet be continued. Further skilled SLP services are not clinically indicated at this time.    HPI HPI: Pt is a 40 y/o male with PMH PTSD, and anxiety. He was admitted 3/2 with two weeks of chest pain.  Pt was emergently intubated 3/2-3/4. EEG suggested moderate, diffuse encephalopathy without seizure activity. Found to be in rhabdo with AKI. Pt reintubated 3/6-3/14 for airway protection due to hypoactive delirium. BSE 3/15: no signs of aspiration or dysphagia. He both passed a 3 oz water swallow with RN and SLP. A clear liquid diet had been started by MD at that time and SLP recommended advancement per critical care. Episode of desaturation after eating on 3/15, MD suspected silent aspiration. Vasovagal event with ice water. Pt re-intubated 3/17-trach 3/18.  CXR 3/18: Patchy interstitial and airspace opacities persist compatible with multi lobar pneumonia. Pt decannulated on 3/26.      SLP Plan  All goals met;Discharge SLP treatment due to (comment)       Recommendations  Diet recommendations: Regular;Thin liquid Liquids provided via: Cup;Straw Medication Administration: Whole meds with liquid Supervision: Patient able to self feed Compensations: Minimize environmental distractions                Oral Care Recommendations: Oral care BID Follow up Recommendations:  Inpatient Rehab SLP Visit Diagnosis: Dysphagia, unspecified (R13.10) Plan: All goals met;Discharge SLP treatment due to (comment)       Lizet Kelso I. Hardin Negus, [place], [place] Office number [phone] Pager [place] 11/21/2020, 3:41 PM

## 2020-11-21 NOTE — TOC Progression Note (Addendum)
Transition of Care [facility]) - Progression Note    Patient Details  Name: AMBROSE WILE MRN: [id number] Date of Birth: December 01, 1980  [facility] CM/SW Contact  Janae Bridgeman, RN Phone Number: 11/21/2020, 9:10 AM  Clinical Narrative:    Case management called and left a message with Mallie Darting, MSW [phone];  [phone] ext [phone]) with [facility] to [facility] needs for patient once he is medically stable to discharge home.  The patient was decannulated over the weekend and would like to go home with family supervision.  CM and MSW will continue to follow the patient for discharge needs.   Expected Discharge Plan: IP Rehab Facility Barriers to Discharge: Continued Medical Work up  Expected Discharge Plan and Services Expected Discharge Plan: IP Rehab Facility In-house Referral: [facility] [employer] [facility] Discharge Planning Services: CM Consult,Medication Assistance Post Acute Care Choice: IP Rehab Living arrangements for the past 2 months: Single Family Home                                       Social Determinants of Health (SDOH) Interventions    Readmission Risk Interventions Readmission Risk Prevention Plan 11/18/2020  Transportation Screening Complete  PCP or Specialist Appt within 5-7 Days Complete  Home Care Screening Complete  Medication Review (RN CM) Complete  Some recent data might be hidden

## 2020-11-22 DIAGNOSIS — M609 Myositis, unspecified: Secondary | ICD-10-CM | POA: Diagnosis present

## 2020-11-22 LAB — EPSTEIN-BARR VIRUS (EBV) ANTIBODY PROFILE
EBV NA IgG: >600 U/mL — ABNORMAL HIGH (ref 0.0–17.9)
EBV VCA IgG: 202 U/mL — ABNORMAL HIGH (ref 0.0–17.9)
EBV VCA IgM: <36 U/mL (ref 0.0–35.9)

## 2020-11-22 LAB — BASIC METABOLIC PANEL
Anion gap: 9 (ref 5–15)
BUN: 10 mg/dL (ref 6–20)
CO2: 27 mmol/L (ref 22–32)
Calcium: 9.5 mg/dL (ref 8.9–10.3)
Chloride: 102 mmol/L (ref 98–111)
Creatinine, Ser: 0.97 mg/dL (ref 0.61–1.24)
GFR, Estimated: >60 mL/min (ref >60)
Glucose, Bld: 110 mg/dL — ABNORMAL HIGH (ref 70–99)
Potassium: 4.1 mmol/L (ref 3.5–5.1)
Sodium: 138 mmol/L (ref 135–145)

## 2020-11-22 LAB — ALDOLASE: Aldolase: 9.2 U/L (ref 3.3–10.3)

## 2020-11-22 LAB — ACETYLCHOLINE RECEPTOR, BINDING: Acety choline binding ab: 0.08 nmol/L (ref 0.00–0.24)

## 2020-11-22 LAB — CK: Total CK: 914 U/L — ABNORMAL HIGH (ref 49–397)

## 2020-11-22 MED ORDER — PREDNISONE 5 MG PO TABS
10.0000 mg | ORAL_TABLET | Freq: Every day | ORAL | Status: DC
  Administered 2020-11-23: 10 mg via ORAL
  Filled 2020-11-22 (×2): qty 2

## 2020-11-22 MED ORDER — PREDNISONE 5 MG PO TABS
50.0000 mg | ORAL_TABLET | Freq: Once | ORAL | Status: AC
  Administered 2020-11-22: 50 mg via ORAL
  Filled 2020-11-22: qty 2

## 2020-11-22 NOTE — Progress Notes (Signed)
Occupational Therapy Treatment Patient Details Name: Andrew [name] MRN: [id number] DOB: 04-21-1981 Today's Date: 11/22/2020    History of present illness 40y/o male admitted 3/2 with two weeks of chest pain.  Pt was emergently intubated 3/2- 3/4. EEG suggested moderate, diffuse encephalopathy without seizure activity. Found to be in rhabdo with AKI. RUE pain--MRI shoulder:Edema in musculature about the shoulder. reintubated 3/6 due to hypoactive delirium for airway protection; 3/7 lumbar puncture. Extubation 3/14. Re-intubated 3/17.  Trach 3/18.  Decannulated 3/26. PMhx: PTSD, anxiety.   OT comments  Pt making steady progress towards OT goals this session. Pt reports pain from body aches this session but agreeable to OT intervention. Pt currently requires supervision for standing grooming tasks, s/u assist for LB ADLs and min guard assist for household distance functional mobility with no AD. Pt initially reports dizziness upon stand but improves as session progresses. In light of progression update DC recs to [facility] with support from pts fiance. Will continue to follow acutely per POC.    Follow Up Recommendations  Home health OT;Supervision - Intermittent    Equipment Recommendations  None recommended by OT    Recommendations for Other Services      Precautions / Restrictions Precautions Precautions: None Restrictions Weight Bearing Restrictions: No       Mobility Bed Mobility Overal bed mobility: Independent Bed Mobility: Supine to Sit                Transfers Overall transfer level: Modified independent Equipment used: None Transfers: Sit to/from Stand Sit to Stand: Min guard         General transfer comment: min guard for safety d/t reports of initial dizziness    Balance Overall balance assessment: Needs assistance Sitting-balance support: Feet supported;No upper extremity supported Sitting balance-Leahy Scale: Normal     Standing balance support: No  upper extremity supported;During functional activity Standing balance-Leahy Scale: Good Standing balance comment: standing at sink with no UE support with no LOB                           ADL either performed or assessed with clinical judgement   ADL Overall ADL's : Needs assistance/impaired     Grooming: Oral care;Standing;Supervision/safety Grooming Details (indicate cue type and reason): standing at sink with no UE support- supervision for safety             Lower Body Dressing: Set up;Sit to/from stand   Toilet Transfer: Ambulation;Min Pension scheme manager Details (indicate cue type and reason): simulated via functional mobility with Rw and min guard for safety as pt initially reports feeling light headed upon stand         Functional mobility during ADLs: Min guard General ADL Comments: pt making good progress with mobility, standing grooming tasks, and LB ADLs. pt likely now to DC home with support from fiance     Vision       Perception     Praxis      Cognition Arousal/Alertness: Awake/alert Behavior During Therapy: Flat affect Overall Cognitive Status: No family/caregiver present to determine baseline cognitive functioning                                 General Comments: pt flat during session but opened up more as sesssion progressed about frustration with still being in hospital so long, offered emotional support and provided active listening  Exercises     Shoulder Instructions       General Comments HR increase to 123 bpm with mobility on RA with SpO2 WFL, pt reports available support at home from fiance and father in law can assist with IADLs, pt has shower seat    Pertinent Vitals/ Pain       Pain Assessment: Faces Faces Pain Scale: Hurts a little bit Pain Location: body aches Pain Descriptors / Indicators: Aching Pain Intervention(s): Monitored during session;Repositioned;Patient requesting pain meds-RN  notified  Home Living                                          Prior Functioning/Environment              Frequency  Min 2X/week        Progress Toward Goals  OT Goals(current goals can now be found in the care plan section)  Progress towards OT goals: Progressing toward goals  Acute Rehab OT Goals Patient Stated Goal: to go home OT Goal Formulation: With patient Time For Goal Achievement: 11/30/20 (last seen by OTR 3/23) Potential to Achieve Goals: Good  Plan Discharge plan remains appropriate;Frequency remains appropriate    Co-evaluation                 AM-PAC OT "6 Clicks" Daily Activity     Outcome Measure   Help from another person eating meals?: None Help from another person taking care of personal grooming?: None Help from another person toileting, which includes using toliet, bedpan, or urinal?: A Little Help from another person bathing (including washing, rinsing, drying)?: A Little Help from another person to put on and taking off regular upper body clothing?: None Help from another person to put on and taking off regular lower body clothing?: None 6 Click Score: 22    End of Session    OT Visit Diagnosis: Unsteadiness on feet (R26.81);Muscle weakness (generalized) (M62.81);Other symptoms and signs involving cognitive function;Pain Pain - part of body:  (all over body aches)   Activity Tolerance Patient tolerated treatment well   Patient Left in chair;with call bell/phone within reach   Nurse Communication Mobility status        Time: 1447-1510 OT Time Calculation (min): 23 min  Charges: OT General Charges $OT Visit: 1 Visit OT Treatments $Self Care/Home Management : 23-37 mins  Lenor Derrick., COTA/L Acute Rehabilitation Services [phone]    Barron Schmid 11/22/2020, 4:27 PM

## 2020-11-22 NOTE — Progress Notes (Signed)
[facility] PROGRESS NOTE  Andrew [name] WRU:[id number] DOB: 1981/04/20 DOA: 10/26/2020 PCP: Reeves Dam, MD  Status: Remains inpatient appropriate because:Unsafe d/c plan and Inpatient level of care appropriate due to severity of illness   Dispo:  Patient From: Home  Planned Disposition: Home  Medically stable for discharge: NO-  BARRIERS to DC: Need to normalize CK levels before discharge.  Also VA benefits not applicable to current hospitalization due to not notifying the [place] system within 72 hours of admission.             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  During ED evaluation he became combative, refusing care and notably tachycardic with heart rates of 280 with a temperature of 102.5.  Patient was emergently intubated for airway protection and admitted to the PCCM service.  Patient was found to have rhabdomyolysis, acute renal failure requiring HD.  There was concern for Wellbutrin and Seroquel contributing to this.  Several attempts at extubation were made that were unsuccessful and ultimately patient underwent tracheostomy on 11/11/2020.  Patient was transferred from [facility] to [facility] on 11/16/2020.  Subjective: Awake and alert.  States he slept better last night.  When asked specifically if he is having myalgias he stated yes.  Discussed with patient that would likely not be able to resume Seroquel after discharge.  I told him I felt like he was doing fine with the trazodone and he agreed.  Explained that he would need to stay in the hospital until his CK levels and inflammatory markers have improved.  Explained that I would be giving him low-dose steroids to  help with what appears to be an associated inflammatory myositis.  He states he does not have much appetite.  Explained this could also be coming from the rhabdomyolysis even with normal renal function.  Told him that the steroid should help improve his appetite.  Objective: Vitals:   11/22/20 0409 11/22/20 0809  BP: 122/65 105/83  Pulse: 89 (!) 112  Resp: 20   Temp: 98.6 F (37 C) 98.7 F (37.1 C)  SpO2: 98%     Intake/Output Summary (Last 24 hours) at 11/22/2020 0843 Last data filed at 11/21/2020 2240 Gross per 24 hour  Intake 720 ml  Output 2400 ml  Net -1680 ml   Filed Weights   11/18/20 0329 11/19/20 0329 11/22/20 0409  Weight: 91.2 kg 94.3 kg 91.5 kg    Exam:  Constitutional: Awake, calm, no acute distress Respiratory: Bilateral lung sounds are clear to auscultation and he remained stable on room air.  Previous trach site unremarkable. Cardiovascular: Normal heart sounds.  Pulse regular not tachycardic.  Normotensive Abdomen:  LBM 3/27.  Poor oral intake with patient self reporting anorexia.  Abdomen exam benign with no tenderness and normal active bowel sounds are present. Musculoskeletal: No edema.  Extremities are slightly tender to the touch diffusely.  No other abnormalities Neurologic: CN 2-12 grossly intact.  No focal neurological deficits.  Patient is ambulatory. Psychiatric: Alert and oriented x3.  Pleasant affect.   Assessment/Plan: Acute problems: Acute hypoxemic respiratory failure s/p trach; decannulated 3/26 -Presented to the ED with chest pain, became belligerent with elevated heart rate and respiratory distress and was  subsequently intubated for airway protection.  Patient failed extubation x2 in the intensive care unit and ultimately had a tracheostomy placed on 11/11/2020. --Difficulty with trach on 3/26.  Unable to suction and patient breathing around trach.  PCCM made decision to decannulate. -As of 3/28 remained stable on room air  Acute metabolic  encephalopathy due to hypoactive delirium: Resolved Hx PTSD, GAD, MDD Patient with acute psychosis on admission.  On multiple psychoactive medications to include Seroquel and Wellbutrin outpatient.  UDS and head CT negative on admission.  Denies history of drug abuse but family suspects possible use.  Initially requiring Precedex infusion, which has been titrated off. --Psychiatry following, appreciate assistance --Zoloft 50 mg p.o. nightly --Prazosin 4 mg p.o. daily --Klonopin 1 mg p.o. 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 CK high of 41K -down to 1685 on 3/24 and 3/27 1808 therefore will need to be continued on lactated Ringer's at 125 cc/h.  We will follow labs frequently. -CK > 1500 despite washout of Seroquel and normalization of renal Function.  -3/29 Suspect a degree of inflammatory myositis -CRP, ESR and LDH elevated so will give Prednisone 50 mg x 1 then Prednisone 10 mg daily x 5 days.  -3/29 CK down to 914 so will continue LR at 125/hr -Acetylcholine receptor binding, Aldolase and EBV panel pending. Recent LFTs are normal -ARF likely secondary to rhabdomyolysis/pigment nephropathy from medications use versus questionable shoulder injury.   -Renal ultrasound shows right kidney normal in appearance, visualization left kidney limited with no abnormality seen and bladder decompressed.   -Nephrology was consulted and patient underwent HD with improvement of renal function.  Hemodialysis was discontinued and nephrology signed off on 11/09/2020. --Cr 0.99>>11.94>>1.22>1.17 > 0.97 --Avoid nephrotoxins, renal dose all medications  Anemia with iron deficiency -Hemoglobin 9.3 with MCV 94.4.  Iron 22 saturation 8  otherwise anemia panel unremarkable -Was given IV iron 500 mg on 3/28 -We will need repeat anemia panel in 6 weeks.  Bilateral gastrocnemius DVTs/Suspected PE TTE 11/07/2020 with no RV heart strain appreciated.  Normal LVEF without LV regional wall motion abnormalities. -Continue Eliquis  Weakness/deconditioning/debility. --As of 3/28 he is mobilizing well with minimal assistance and no longer meets requirement for additional rehabilitative therapies at CIR -current recommendation is for discharge to home with home health PT  Dysphagia -Resolved -Regular diet    Other problems: Multilobar pneumonia, aspiration pneumonitis --Completed 7-day course of cefepime  Anemia secondary to critical illness Hemoglobin 8.8, stable. --Vitamin B12 daily  Essential hypertension BP 153/100 --Amlodipine 10 mg p.o. daily --Carvedilol 25 mg p.o. twice daily --Clonidine 0.1 mg p.o. 3 times daily --Hydralazine 50 mg p.o. every 6 hours  Hypomagnesemia Magnesium 1.6, will replete. --Repeat electrolytes in a.m.   Data Reviewed: Basic Metabolic Panel: Recent Labs  Lab 11/17/20 0313 11/18/20 0122 11/19/20 0127 11/20/20 0227 11/22/20 0105  NA 139 138 139 136 138  K 3.5 3.9 4.1 3.8 4.1  CL 109 104 106 101 102  CO2 $Re'22 24 23 24 27  'NOR$ GLUCOSE 104* 113* 110* 110* 110*  BUN $Re'17 15 17 13 10  'lwN$ CREATININE 1.17 1.14 1.17 1.13 0.97  CALCIUM 9.0 9.3 9.4 9.3 9.5  MG 1.6* 1.8  --  1.7  --   PHOS  --   --   --  3.8  --    Liver Function Tests: Recent Labs  Lab 11/16/20 0030 11/20/20 0227  AST 58* 90*  ALT 23 34  ALKPHOS 57 62  BILITOT 0.9 0.3  PROT 6.9 7.4  ALBUMIN 3.2* 3.6   No results for input(s): LIPASE, AMYLASE in the last 168 hours. No results for input(s): AMMONIA in the last 168 hours. CBC: Recent Labs  Lab 11/16/20 0030 11/18/20 0122 11/20/20 0227  WBC 8.6 7.5 8.6  HGB 8.8* 9.3* 9.3*  HCT 27.5* 28.9* 28.5*  MCV 96.8 94.4 94.4  PLT 506* 563* 589*   Cardiac  Enzymes: Recent Labs  Lab 11/17/20 0313 11/19/20 0127 11/20/20 0227 11/21/20 0241 11/22/20 0105  CKTOTAL 1,685* 1,665* 1,920* 1,808* 914*   BNP (last 3 results) Recent Labs    11/19/20 1558  BNP 11.4    ProBNP (last 3 results) No results for input(s): PROBNP in the last 8760 hours.  CBG: Recent Labs  Lab 11/18/20 0321 11/18/20 0801 11/18/20 1104 11/21/20 0249 11/21/20 0729  GLUCAP 106* 133* 123* 101* 107*    No results found for this or any previous visit (from the past 240 hour(s)).   Studies: No results found.  Scheduled Meds: . amLODipine  10 mg Oral Daily  . apixaban  5 mg Oral BID  . carvedilol  25 mg Oral BID WC  . chlorhexidine  15 mL Mouth Rinse BID  . Chlorhexidine Gluconate Cloth  6 each Topical Q0600  . cloNIDine  0.1 mg Oral BID  . docusate sodium  100 mg Oral BID  . feeding supplement  237 mL Oral BID BM  . mouth rinse  15 mL Mouth Rinse q12n4p  . multivitamin with minerals  1 tablet Oral Daily  . pantoprazole  40 mg Oral QHS  . polyethylene glycol  17 g Oral Daily  . prazosin  4 mg Oral QHS  . predniSONE  50 mg Oral Once   Followed by  . [START ON 11/23/2020] predniSONE  10 mg Oral Q breakfast  . senna  1 tablet Oral BID  . sertraline  50 mg Oral QHS  . sodium chloride flush  10-40 mL Intracatheter Q12H  . sodium chloride flush  10-40 mL Intracatheter Q12H  . traZODone  50 mg Oral QHS  . vitamin B-12  1,000 mcg Oral Daily   Continuous Infusions: . sodium chloride Stopped (11/08/20 1130)    Principal Problem:   Acute on chronic respiratory failure with hypoxia (HCC) Active Problems:   PTSD (post-traumatic stress disorder)   Severe episode of recurrent major depressive disorder, with psychotic features ([place])   Generalized anxiety disorder   AKI (acute kidney injury) ([place])   Encounter for central line placement   Abdominal distention   Tachycardia   Primary hypertension   Ileus ([place])   Acute respiratory failure with hypoxemia  [facility])   Consultants:  PCCM  Nephrology  Psychiatry - signed off 3/24  Neurology   Procedures:  ETT 3/3 >>3/4  ETT 3/6 >> 3/14  HD catheter 3/9 >> 3/15  ETT 3/16 >>  Trach 3/18 >>   Antibiotics: Ceftriaxone 3/3 >> 3/6 Azithromycin 3/3 x 1 Cefazolin 3/6 >> 3/7 Ceftriaxone 3/7 >> 3/9 Vanc 3/6 X 1 dose Vanc 3/7 >> 3/8 Ceftriaxone 3/13 >> 3/15 Cefepime 3/16 >>   Time spent: 25 minutes    Erin Hearing ANP  [facility] 7 am - 330 pm/M-F for direct patient care and secure chat Please refer to [place] for contact info 26  days

## 2020-11-22 NOTE — TOC Progression Note (Addendum)
Transition of Care [facility]) - Progression Note    Patient Details  Name: Andrew [name] MRN: [id number] Date of Birth: 1980/10/10  Transition of Care [place]) CM/SW [place], RN Phone Number: 11/22/2020, 12:45 PM  Clinical Narrative:    Case management met with the patient at the bedside regarding transitions of care.  The patient states that he is not feeling well today and has no appetite.  He states that he wants to go home with family once he is feeling better.  The patient was given choice regarding home health and did not have a preference and was agreeable to services.  I called Cyndee Brightly, CM at [facility] and left a message asking for home health services for the patient.  CM called and spoke with Ramond Marrow, [place] with [facility] and asked for home health services for the patient through the [place] - pending possible discharge home this week towards the end of the week.  CM and MSW will continue to follow the patient for transitions of care needs for home.  11/22/2020 1348 - I called and spoke with Cyndee Brightly, MSW at [facility] and the patient has been approved for home health services through the [place].   The clinicals and home health orders were faxed to Dr. Ishmael Holter office at [facility] to fax [phone].  [facility] called back and they have accepted the patient for care once the patient is discharged to home from the hospital.  Expected Discharge Plan: [place] Barriers to Discharge: Continued Medical Work up  Expected Discharge Plan and Services Expected Discharge Plan: [place] In-house Referral: Clinical Social Work Discharge Planning Services: CM Consult Post Acute Care Choice: [place] arrangements for the past 2 months: Single Family Home                           HH Arranged: RN,PT,OT,Nurse's Aide [place]: [place] ([facility]) Date HH Agency Contacted:  11/22/20 Time [place]: [phone] Representative spoke with at [place]: Ramond Marrow, [place] at [place] ([place]) Interventions    Readmission Risk Interventions Readmission Risk Prevention Plan 11/22/2020 11/18/2020  Transportation Screening Complete Complete  PCP or Specialist Appt within 5-7 Days - Complete  PCP or Specialist Appt within 3-5 Days Complete -  Home Care Screening - Complete  Medication Review (RN CM) - Complete  HRI or Home Care Consult Complete -  Social Work Consult for [place] Planning/Counseling Complete -  Palliative Care Screening Complete -  Medication Review Press photographer) Complete -  Some recent data might be hidden

## 2020-11-23 ENCOUNTER — Other Ambulatory Visit: Payer: Self-pay | Admitting: Nurse Practitioner

## 2020-11-23 LAB — ALDOSTERONE + RENIN ACTIVITY W/ RATIO
ALDO / PRA Ratio: <2.6 (ref 0.0–30.0)
Aldosterone: <1 ng/dL (ref 0.0–30.0)
PRA LC/MS/MS: 0.391 ng/mL/hr (ref 0.167–5.380)

## 2020-11-23 LAB — CK: Total CK: 1267 U/L — ABNORMAL HIGH (ref 49–397)

## 2020-11-23 MED ORDER — CARVEDILOL 25 MG PO TABS: 25.0000 mg | ORAL_TABLET | Freq: Two times a day (BID) | ORAL | 3 refills | Status: DC

## 2020-11-23 MED ORDER — ENSURE ENLIVE PO LIQD
237.0000 mL | Freq: Three times a day (TID) | ORAL | Status: DC
  Administered 2020-11-23 (×2): 237 mL via ORAL
  Filled 2020-11-23: qty 237

## 2020-11-23 MED ORDER — APIXABAN 5 MG PO TABS: 5.0000 mg | ORAL_TABLET | Freq: Two times a day (BID) | ORAL | 3 refills | Status: DC

## 2020-11-23 MED ORDER — PRAZOSIN HCL 2 MG PO CAPS: 4.0000 mg | ORAL_CAPSULE | Freq: Every day | ORAL | 3 refills | Status: AC

## 2020-11-23 MED ORDER — CYANOCOBALAMIN 1000 MCG PO TABS: 1000.0000 ug | ORAL_TABLET | Freq: Every day | ORAL | 3 refills | Status: AC

## 2020-11-23 MED ORDER — ADULT MULTIVITAMIN W/MINERALS CH: 1.0000 | ORAL_TABLET | Freq: Every day | ORAL | Status: AC

## 2020-11-23 MED ORDER — TRAZODONE HCL 50 MG PO TABS: 50.0000 mg | ORAL_TABLET | Freq: Every day | ORAL | 3 refills | Status: AC

## 2020-11-23 MED ORDER — SENNA 8.6 MG PO TABS: 1.0000 | ORAL_TABLET | Freq: Two times a day (BID) | ORAL | 0 refills | Status: DC

## 2020-11-23 MED ORDER — AMLODIPINE BESYLATE 10 MG PO TABS: 10.0000 mg | ORAL_TABLET | Freq: Every day | ORAL | 3 refills | Status: DC

## 2020-11-23 MED ORDER — LACTATED RINGERS IV SOLN: INTRAVENOUS | Status: DC

## 2020-11-23 MED ORDER — SERTRALINE HCL 50 MG PO TABS: 50.0000 mg | ORAL_TABLET | Freq: Every day | ORAL | 3 refills | Status: DC

## 2020-11-23 MED ORDER — CLONIDINE HCL 0.1 MG PO TABS: 0.1000 mg | ORAL_TABLET | Freq: Two times a day (BID) | ORAL | 11 refills | Status: AC

## 2020-11-23 MED FILL — VITAMIN B-12 1000 MCG TABS: 1000 | 30 days supply | Qty: 30 | Fill #0

## 2020-11-23 MED FILL — SENNA 8.6 MG TABS: 8.6 | 30 days supply | Qty: 60 | Fill #0

## 2020-11-23 MED FILL — SERTRALINE HCL 50 MG TABLET: 50 | 30 days supply | Qty: 30 | Fill #0

## 2020-11-23 MED FILL — PRAZOSIN HCL 1 MG CAPS: 1 | 30 days supply | Qty: 120 | Fill #0

## 2020-11-23 MED FILL — cloNIDine HCL 0.1 MG TABS: 0.1 | 30 days supply | Qty: 60 | Fill #0

## 2020-11-23 MED FILL — traZODone HCL 50 MG TABS: 50 | 30 days supply | Qty: 30 | Fill #0

## 2020-11-23 MED FILL — CARVEDILOL 25 MG TABLET: 25 | 30 days supply | Qty: 60 | Fill #0

## 2020-11-23 MED FILL — AMLODIPINE BESYLATE 10 MG T: 10 | 30 days supply | Qty: 30 | Fill #0

## 2020-11-23 MED FILL — ELIQUIS 5 MG TABLET: 5 | 30 days supply | Qty: 60 | Fill #0

## 2020-11-23 NOTE — TOC Transition Note (Signed)
Transition of Care [facility]) - CM/SW Discharge Note   Patient Details  Name: Andrew [name] MRN: [id number] Date of Birth: 03-Nov-1980  Transition of Care [facility]) CM/SW Contact:  Durenda Guthrie, RN Phone Number: 11/23/2020, 3:11 PM   Clinical Narrative:  Case manager completed MATCH fo patient, [facility] pharmacy will fill prescriptions and deliver to patient. Home Health arranged by previous Case Manager. No further needs identified.     Final next level of care: Home/Self Care Barriers to Discharge: Continued Medical Work up   Patient Goals and CMS Choice Patient states their goals for this hospitalization and ongoing recovery are:: Patient wants to discharge home with family supports. CMS Medicare.gov Compare Post Acute Care list provided to:: Patient Choice offered to / list presented to : Patient  Discharge Placement                       Discharge Plan and Services In-house Referral: Clinical Social Work Discharge Planning Services: [facility] Program,Medication Assistance Post Acute Care Choice: Durable Medical Equipment,Home Health                    HH Arranged: RN,PT,OT,Nurse's Aide [facility] Agency: [facility] ([facility]) Date HH Agency Contacted: 11/22/20 Time HH Agency Contacted: [phone] Representative spoke with at [place] [facility] Agency: Pearson Grippe, CM at [facility]  Social Determinants of Health (SDOH) Interventions     Readmission Risk Interventions Readmission Risk Prevention Plan 11/22/2020 11/18/2020  Transportation Screening Complete Complete  PCP or Specialist Appt within 5-7 Days - Complete  PCP or Specialist Appt within 3-5 Days Complete -  Home Care Screening - Complete  Medication Review (RN CM) - Complete  HRI or Home Care Consult Complete -  Social Work Consult for Recovery Care Planning/Counseling Complete -  Palliative Care Screening Complete -  Medication Review Oceanographer) Complete -  Some recent data might be hidden

## 2020-11-23 NOTE — Progress Notes (Signed)
Physical Therapy Treatment Patient Details Name: Andrew [name] MRN: [id number] DOB: 1981-02-08 Today's Date: 11/23/2020    History of Present Illness 39y/o male admitted 3/2 with two weeks of chest pain.  Pt was emergently intubated 3/2- 3/4. EEG suggested moderate, diffuse encephalopathy without seizure activity. Found to be in rhabdo with AKI. RUE pain--MRI shoulder:Edema in musculature about the shoulder. reintubated 3/6 due to hypoactive delirium for airway protection; 3/7 lumbar puncture. Extubation 3/14. Re-intubated 3/17.  Trach 3/18.  Decannulated 3/26. PMhx: PTSD, anxiety.    PT Comments    Pt in bathroom on entry, agreeable to walking with therapy. Pt started ambulation with only socks for 50 feet, requested to put on his shoes. Mobility with shoes slightly improved. Pt is independent with transfers and mod I to supervision for ambulation. PT provide education on slowed return to a regular exercise program. D/c plans remain appropriate. PT will continue to follow acutely.     Follow Up Recommendations  Home health PT;Supervision - Intermittent     Equipment Recommendations  None recommended by PT       Precautions / Restrictions Precautions Precautions: None Restrictions Weight Bearing Restrictions: No    Mobility  Bed Mobility                  Transfers Overall transfer level: Independent Equipment used: None Transfers: Sit to/from Stand           General transfer comment: No instability  Ambulation/Gait Ambulation/Gait assistance: Modified independent (Device/Increase time);Supervision Gait Distance (Feet): 750 Feet Assistive device: None Gait Pattern/deviations: Step-through pattern;Wide base of support Gait velocity: decr Gait velocity interpretation: >2.62 ft/sec, indicative of community ambulatory General Gait Details: mod I for straight line ambulation, improved with putting on his shoes. Pt requires increased effort for balance with high  level balance activities          Balance Overall balance assessment: Needs assistance Sitting-balance support: Feet supported;No upper extremity supported Sitting balance-Leahy Scale: Normal     Standing balance support: No upper extremity supported;During functional activity Standing balance-Leahy Scale: Good               High level balance activites: Head turns;Turns;Sudden stops High Level Balance Comments: supervision with some slight unsteadiness but no overt loss of balance            Cognition Arousal/Alertness: Awake/alert Behavior During Therapy: Flat affect Overall Cognitive Status: Within Functional Limits for tasks assessed                                           General Comments General comments (skin integrity, edema, etc.): HR continues to increase into 120s for ambulation      Pertinent Vitals/Pain Faces Pain Scale: No hurt           PT Goals (current goals can now be found in the care plan section) Acute Rehab PT Goals Patient Stated Goal: to go home PT Goal Formulation: With patient Time For Goal Achievement: 11/30/20 Potential to Achieve Goals: Good Progress towards PT goals: Progressing toward goals    Frequency    Min 3X/week      PT Plan Discharge plan needs to be updated       AM-PAC PT "6 Clicks" Mobility   Outcome Measure  Help needed turning from your back to your side while in a flat bed without using bedrails?:  None Help needed moving from lying on your back to sitting on the side of a flat bed without using bedrails?: None Help needed moving to and from a bed to a chair (including a wheelchair)?: None Help needed standing up from a chair using your arms (e.g., wheelchair or bedside chair)?: None Help needed to walk in hospital room?: None Help needed climbing 3-5 steps with a railing? : A Little 6 Click Score: 23    End of Session   Activity Tolerance: Patient tolerated treatment  well Patient left: in bed;with call bell/phone within reach Nurse Communication: Mobility status PT Visit Diagnosis: Other abnormalities of gait and mobility (R26.89);Muscle weakness (generalized) (M62.81);Pain     Time: 5631-4970 PT Time Calculation (min) (ACUTE ONLY): 27 min  Charges:  $Gait Training: 23-37 mins                     Andrew [name] B. Beverely Risen PT, DPT Acute Rehabilitation Services Pager [phone] Office [phone]    Andrew [name] 11/23/2020, 3:19 PM

## 2020-11-23 NOTE — Progress Notes (Signed)
[facility] PROGRESS NOTE  Andrew [name] FTD:[id number] DOB: 18-Mar-1981 DOA: 10/26/2020 PCP: Agustina Caroli, MD  Status: Remains inpatient appropriate because:Unsafe d/c plan and Inpatient level of care appropriate due to severity of illness   Dispo:  Patient From: Home  Planned Disposition: Home  Medically stable for discharge: NO-  BARRIERS to DC: Need to normalize CK levels before discharge.  Also VA benefits not applicable to current hospitalization due to not notifying the [place] system within 72 hours of admission.             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  During ED evaluation he became combative, refusing care and notably tachycardic with heart rates of 280 with a temperature of 102.5.  Patient was emergently intubated for airway protection and admitted to the PCCM service.  Patient was found to have rhabdomyolysis, acute renal failure requiring HD.  There was concern for Wellbutrin and Seroquel contributing to this.  Several attempts at extubation were made that were unsuccessful and ultimately patient underwent tracheostomy on 11/11/2020.  Patient was transferred from [facility] to [facility] on 11/16/2020.  Subjective: Sts eating better, Myalgias improved. Questions answered re dc expectations. Informed plan is to dc 3/31 w/ HH services  Objective: Vitals:   11/23/20 0601 11/23/20 0755  BP: 136/79 100/80  Pulse:  (!) 102  Resp: 18 16  Temp: 98.7 F (37.1 C) 98.7 F (37.1 C)  SpO2:  99%    Intake/Output Summary (Last 24 hours) at 11/23/2020 0820 Last data filed at 11/23/2020 0757 Gross per 24 hour  Intake 240 ml  Output 1325 ml  Net -1085 ml   Filed  Weights   11/18/20 0329 11/19/20 0329 11/22/20 0409  Weight: 91.2 kg 94.3 kg 91.5 kg    Exam:  Constitutional: Calm, comfortable, NAD Respiratory: Bilateral lung sounds CTA, RA Cardiovascular: S1S2, no tachycardia, no peripheral edema Abdomen:  LBM 3/27. Eating better, BS+, nontender  Musculoskeletal:  Neurologic: CN 2-12 grossly intact.  No focal neurological deficits.  Patient is ambulatory. Psychiatric: A and O x 3, Pleasant affect   Assessment/Plan: Acute problems: Acute hypoxemic respiratory failure s/p trach; decannulated 3/26 -Presented to the ED with chest pain, became belligerent with elevated heart rate and respiratory distress and was subsequently intubated for airway protection.  Patient failed extubation x2 in the intensive care unit and ultimately had a tracheostomy placed on 11/11/2020. --Difficulty with trach on 3/26.  Unable to suction and patient breathing around trach.  PCCM made decision to decannulate. -As of 3/28 remained stable on room air  Acute metabolic encephalopathy due to hypoactive delirium: Resolved Hx PTSD, GAD, MDD Patient with acute psychosis on admission.  On multiple psychoactive medications to include Seroquel and Wellbutrin outpatient.  UDS and head CT negative on admission.  Denies history of drug abuse but family suspects possible use.  Initially requiring Precedex infusion, which has been titrated off. --Psychiatry following, appreciate assistance --Zoloft 50 mg p.o. nightly --Prazosin 4 mg p.o. daily --Klonopin 1 mg p.o. twice daily as needed anxiety --Haldol as needed agitation -Plan to dc Seroquel at dc since CK remains elevated -3/25 initiated trazodone 50 mg HS-pt reports excellent response-will continue upon dc  Acute renal failure/Persistent  low-grade rhabdomyolysis/suspect acute inflammatory myositis Creatinine 0.99 on admission, trended up to a high of 11.94 during hospitalization. CK high of 41K -down to 1685 on 3/24 and 3/27 1808  therefore will need to be continued on lactated Ringer's at 125 cc/h.  We will follow labs frequently. -CK has been-between 915 100 with most recent reading around 1200.  Patient with some mild myalgias but no tissue edema and normal renal function.  Did have some mild elevation of inflammatory markers which is expected in this setting.  EBV demonstrated prior infection but no acute EBV infection.  Discussed with attending who recommended discontinue IV fluids, follow-up CK in several weeks after discharge and no indication to continue IV fluids.  Hopefully patient will improve in the next few days and will be able to discharge home. -ARF likely secondary to rhabdomyolysis/pigment nephropathy from medications use versus questionable shoulder injury.   -Renal ultrasound shows right kidney normal in appearance, visualization left kidney limited with no abnormality seen and bladder decompressed.   -Nephrology was consulted and patient underwent HD with improvement of renal function.  Hemodialysis was discontinued and nephrology signed off on 11/09/2020. --Cr 0.99>>11.94>>1.22>1.17 > 0.97 --Avoid nephrotoxins  Anemia with iron deficiency -Hemoglobin 9.3 with MCV 94.4.  Iron 22 saturation 8 otherwise anemia panel unremarkable -Was given IV iron 500 mg on 3/28 -We will need repeat anemia panel in 6 weeks.  Bilateral gastrocnemius DVTs/Suspected PE TTE 11/07/2020 with no RV heart strain appreciated.  Normal LVEF without LV regional wall motion abnormalities. -Continue Eliquis  Weakness/deconditioning/debility. --As of 3/28 he is mobilizing well with minimal assistance and no longer meets requirement for additional rehabilitative therapies at [facility] -current recommendation is for discharge to home with home health PT  Dysphagia -Resolved -Regular diet    Other problems: Multilobar pneumonia, aspiration pneumonitis --Completed 7-day course of cefepime  Anemia secondary to critical  illness Hemoglobin 8.8, stable. --Vitamin B12 daily  Essential hypertension BP 153/100 --Amlodipine 10 mg p.o. daily --Carvedilol 25 mg p.o. twice daily --Clonidine 0.1 mg p.o. 3 times daily --Hydralazine 50 mg p.o. every 6 hours  Hypomagnesemia Magnesium 1.6, will replete. --Repeat electrolytes in a.m.   Data Reviewed: Basic Metabolic Panel: Recent Labs  Lab 11/17/20 0313 11/18/20 0122 11/19/20 0127 11/20/20 0227 11/22/20 0105  NA 139 138 139 136 138  K 3.5 3.9 4.1 3.8 4.1  CL 109 104 106 101 102  CO2 22 24 23 24 27   GLUCOSE 104* 113* 110* 110* 110*  BUN 17 15 17 13 10   CREATININE 1.17 1.14 1.17 1.13 0.97  CALCIUM 9.0 9.3 9.4 9.3 9.5  MG 1.6* 1.8  --  1.7  --   PHOS  --   --   --  3.8  --    Liver Function Tests: Recent Labs  Lab 11/20/20 0227  AST 90*  ALT 34  ALKPHOS 62  BILITOT 0.3  PROT 7.4  ALBUMIN 3.6   No results for input(s): LIPASE, AMYLASE in the last 168 hours. No results for input(s): AMMONIA in the last 168 hours. CBC: Recent Labs  Lab 11/18/20 0122 11/20/20 0227  WBC 7.5 8.6  HGB 9.3* 9.3*  HCT 28.9* 28.5*  MCV 94.4 94.4  PLT 563* 589*   Cardiac Enzymes: Recent Labs  Lab 11/19/20 0127 11/20/20 0227 11/21/20 0241 11/22/20 0105 11/23/20 0222  CKTOTAL 1,665* 1,920* 1,808* 914* 1,267*   BNP (last 3 results) Recent Labs    11/19/20 1558  BNP 11.4    ProBNP (last 3  results) No results for input(s): PROBNP in the last 8760 hours.  CBG: Recent Labs  Lab 11/18/20 0321 11/18/20 0801 11/18/20 1104 11/21/20 0249 11/21/20 0729  GLUCAP 106* 133* 123* 101* 107*    No results found for this or any previous visit (from the past 240 hour(s)).   Studies: No results found.  Scheduled Meds: . amLODipine  10 mg Oral Daily  . apixaban  5 mg Oral BID  . carvedilol  25 mg Oral BID WC  . chlorhexidine  15 mL Mouth Rinse BID  . Chlorhexidine Gluconate Cloth  6 each Topical Q0600  . cloNIDine  0.1 mg Oral BID  . docusate  sodium  100 mg Oral BID  . feeding supplement  237 mL Oral BID BM  . mouth rinse  15 mL Mouth Rinse q12n4p  . multivitamin with minerals  1 tablet Oral Daily  . pantoprazole  40 mg Oral QHS  . polyethylene glycol  17 g Oral Daily  . prazosin  4 mg Oral QHS  . predniSONE  10 mg Oral Q breakfast  . senna  1 tablet Oral BID  . sertraline  50 mg Oral QHS  . sodium chloride flush  10-40 mL Intracatheter Q12H  . sodium chloride flush  10-40 mL Intracatheter Q12H  . traZODone  50 mg Oral QHS  . vitamin B-12  1,000 mcg Oral Daily   Continuous Infusions: . sodium chloride Stopped (11/08/20 1130)  . lactated ringers      Principal Problem:   Acute on chronic respiratory failure with hypoxia (HCC) Active Problems:   PTSD (post-traumatic stress disorder)   Generalized anxiety disorder   AKI (acute kidney injury) (HCC)   Primary hypertension   Ileus (HCC)   Acute respiratory failure with hypoxemia (HCC)   Myositis of multiple sites   Consultants:  PCCM  Nephrology  Psychiatry - signed off 3/24  Neurology   Procedures:  ETT 3/3 >>3/4  ETT 3/6 >> 3/14  HD catheter 3/9 >> 3/15  ETT 3/16 >>  Trach 3/18 >>   Antibiotics: Ceftriaxone 3/3 >> 3/6 Azithromycin 3/3 x 1 Cefazolin 3/6 >> 3/7 Ceftriaxone 3/7 >> 3/9 Vanc 3/6 X 1 dose Vanc 3/7 >> 3/8 Ceftriaxone 3/13 >> 3/15 Cefepime 3/16 >>   Time spent: 25 minutes    Junious Silk ANP  [facility] 7 am - 330 pm/M-F for direct patient care and secure chat Please refer to Amion for contact info 27  days

## 2020-11-23 NOTE — Progress Notes (Signed)
Nutrition Follow-up  DOCUMENTATION CODES:   Not applicable  INTERVENTION:    Increase Ensure Enlive to TID, each supplement provides 350 kcal and 20 grams of protein  Continue MVI with minerals daily  NUTRITION DIAGNOSIS:   Increased nutrient needs related to acute illness as evidenced by estimated needs.  Ongoing  GOAL:   Patient will meet greater than or equal to 90% of their needs  Progressing   MONITOR:   PO intake,Supplement acceptance,Labs  REASON FOR ASSESSMENT:   Consult Enteral/tube feeding initiation and management  ASSESSMENT:   40 year old male who presented to the ED on 3/2 with chest pain x 2 weeks and developed acute hyperactive delirium. No pertinent PMH. Pt intubated for airway protection.  Decannulated 3/26. Currently on a regular diet. Meal intakes: 70-80%. Patient reports poor appetite recently. Intake of meals is a little less than previously, but still eating pretty well. Drinking Ensure supplements twice daily on most days.   Labs reviewed. Medications reviewed and include vitamin B-12 tablet daily, MVI with minerals.  Lowest weight since admission 91.2 kg on 3/25. Current weight 91.5 kg.  Diet Order:   Diet Order            Diet regular Room service appropriate? Yes with Assist; Fluid consistency: Thin  Diet effective now                 EDUCATION NEEDS:   No education needs have been identified at this time  Skin:  Skin Assessment: Reviewed RN Assessment  Last BM:  3/29  Height:   Ht Readings from Last 1 Encounters:  11/14/20 5\' 10"  (1.778 m)    Weight:   Wt Readings from Last 1 Encounters:  11/22/20 91.5 kg    BMI:  Body mass index is 28.94 kg/m.  Estimated Nutritional Needs:   Kcal:  2300-2500  Protein:  120-140 gm  Fluid:  >/= 2.2 L   11/24/20, RD, LDN, CNSC Please refer to Amion for contact information.

## 2020-11-24 DIAGNOSIS — M609 Myositis, unspecified: Secondary | ICD-10-CM

## 2020-11-24 NOTE — Discharge Summary (Signed)
Physician Discharge Summary  Andrew [name] ZOX:[id number] DOB: 05/05/1981 DOA: 10/26/2020  PCP: Agustina Caroli, MD  Admit date: 10/26/2020 Discharge date: 11/24/2020  Time spent: 45 minutes  Recommendations for Outpatient Follow-up:  1. Patient will need follow-up CPK level and electrolyte panel at follow-up visit with PCP or nephrologist. 2. Patient needs to call his primary care physician Dr. Willa Rough through the [facility] health system in [place] to be seen in 7 to 10 days after hospital discharge. 3. Patient has follow-up appointment scheduled with Dr. Allena Katz with nephrology on 4/18 at 8:30 AM.   Discharge Diagnoses:  Principal Problem:   Acute on chronic respiratory failure with hypoxia (HCC) Active Problems:   PTSD (post-traumatic stress disorder)   Generalized anxiety disorder   AKI (acute kidney injury) (HCC)   Primary hypertension   Ileus (HCC)   Acute respiratory failure with hypoxemia (HCC)   Myositis of multiple sites   Discharge Condition: Stable  Diet recommendation: Regular  Filed Weights   11/18/20 0329 11/19/20 0329 11/22/20 0409  Weight: 91.2 kg 94.3 kg 91.5 kg    History of present illness:  40 year old male [place] veteran with past medical history significant for PTSD/GAD, essential hypertension who initially presented to the ED with chest pain and delirium. During ED evaluation he became combative, refusing care and notably tachycardic with heart rates of 280 with a temperature of 102.5. Patient was emergently intubated for airway protection and admitted to the PCCM service. Patient was found to have rhabdomyolysis, acute renal failure requiring HD. There was concern for Wellbutrin and Seroquel contributing to this. Several attempts at extubation were made that were unsuccessful and ultimately patient underwent tracheostomy on 11/11/2020. Patient was transferred from [facility] on 11/16/2020.  Hospital Course:  Acute problems: Acute hypoxemic respiratory  failure s/p trach; decannulated 3/26 -Presented to the ED with chest pain, became belligerent with elevated heart rate and respiratory distress and was subsequently intubated for airway protection. Patient failed extubation x2 in the intensive care unit and ultimately had a tracheostomy placed on 11/11/2020. --Difficulty with trach on 3/26.  Unable to suction and patient breathing around trach.  PCCM made decision to decannulate. -As of 3/28 remained stable on room air  Acute metabolic encephalopathy due to hypoactive delirium: Resolved Hx PTSD, GAD, MDD Patient with acute psychosis on admission. On multiple psychoactive medications to include Seroquel and Wellbutrin outpatient. UDS and head CT negative on admission. Denies history of drug abuse but family suspects possible use. Initially requiring Precedex infusion, which has been titrated off. --Psychiatry following, appreciate assistance --Zoloft 50 mg p.o. nightly --Prazosin 4 mg p.o. daily --Klonopin 1 mg p.o. twice daily as needed anxiety --Haldol as needed agitation -Plan to dc Seroquel at dc since CK remains elevated -3/25 initiated trazodone 50 mg HS-pt reports excellent response-will continue upon dc  Acute renal failure/Persistent low-grade rhabdomyolysis/suspect acute inflammatory myositis Creatinine 0.99 on admission, trended up to a high of 11.94 during hospitalization. CK high of 41K -down [id number] on 3/24 and 3/27 1808 therefore will need to be continued on lactated Ringer's at 125 cc/h.  We will follow labs frequently. -CK has been-between 915 100 with most recent reading around 1200.  Patient with some mild myalgias but no tissue edema and normal renal function.  Did have some mild elevation of inflammatory markers which is expected in this setting.  EBV demonstrated prior infection but no acute EBV infection.  Discussed with attending who recommended discontinue IV fluids, follow-up CK in several weeks after  discharge and no  indication to continue IV fluids.  Hopefully patient will improve in the next few days and will be able to discharge home. -ARF likely secondary to rhabdomyolysis/pigment nephropathy from medications use versus questionable shoulder injury.  -Renal ultrasound shows right kidney normal in appearance, visualization left kidney limited with no abnormality seen and bladder decompressed.  -Nephrology was consulted and patient underwent HD with improvement of renal function. Hemodialysis was discontinued and nephrology signed off on 11/09/2020. --Cr 0.99>>11.94>>1.22>1.17 > 0.97 --Avoid nephrotoxins  Anemia with iron deficiency -Hemoglobin 9.3 with MCV 94.4.  Iron 22 saturation 8 otherwise anemia panel unremarkable -Was given IV iron 500 mg on 3/28 -We will need repeat anemia panel in 6 weeks.  Bilateral gastrocnemius DVTs/Suspected PE TTE 11/07/2020 with no RV heart strain appreciated. Normal LVEF without LV regional wall motion abnormalities. -Continue Eliquis  Weakness/deconditioning/debility. --As of 3/28 he is mobilizing well with minimal assistance and no longer meets requirement for additional rehabilitative therapies at [facility] -current recommendation is for discharge to home with home health PT  Dysphagia -Resolved -Regular diet    Other problems: Multilobar pneumonia, aspiration pneumonitis --Completed 7-day course of cefepime  Anemia secondary to critical illness Hemoglobin 8.8, stable. --Vitamin B12 daily  Essential hypertension BP 153/100 --Amlodipine 10 mg p.o. daily --Carvedilol 25 mg p.o. twice daily --Clonidine 0.1 mg p.o. 3 times daily --Hydralazine 50 mg p.o. every 6 hours  Hypomagnesemia Magnesium 1.6, will replete. --Repeat electrolytes in a.m.   Procedures:  ETT 3/3 >>3/4  ETT 3/6 >> 3/14  HD catheter 3/9 >> 3/15  ETT 3/16 >>  Trach 3/18 >>   Consultations:  PCCM  Nephrology  Psychiatry- signed off  3/24  Neurology   Discharge Exam: Vitals:   11/24/20 0443 11/24/20 0723  BP: 105/69 110/80  Pulse: (!) 101 98  Resp: 18 17  Temp: 98.8 F (37.1 C) 98.3 F (36.8 C)  SpO2: 97% 100%   Constitutional: Calm, comfortable, NAD Respiratory: Bilateral lung sounds CTA, RA Cardiovascular: S1S2, no tachycardia, no peripheral edema Abdomen:  LBM 3/27. Eating better, BS+, nontender  Musculoskeletal:  Neurologic: CN 2-12 grossly intact.  No focal neurological deficits.  Patient is ambulatory. Psychiatric: A and O x 3, Pleasant affect  Discharge Instructions   Discharge Instructions    Diet general   Complete by: As directed    Discharge instructions   Complete by: As directed    Please make sure you drink plenty of noncaffeinated beverages.  This will help improve that abnormal blood work i.e. CK level that is causing her to have muscle aches.  Please avoid strenuous exercise until this blood level has returned to normal or until instructed by your primary care physician  Your initial discharge prescriptions have been provided by our [facility] pharmacy.  You do have refills available if needed.  You can transfer [facility] prescriptions to either the [facility] outpatient pharmacy [phone]) or the [facility] outpatient pharmacy [phone])  Because of elevated CK levels the Seroquel you had previously been taking was discontinued and replaced by trazodone.  Your provider at the [facility] can make a decision as to whether to continue trazodone after CK levels improve or resume Seroquel.  She has had some generalized weakness from the severity of your illness home health services will provide physical therapy services.  If you continue to require additional therapy needs after your home health visits have expired the therapy team with home health can discuss with the [facility]/your [facility] PCP  to see if you are eligible for outpatient services through the [place].  You were also started on several new  medications to help treat blood pressure.  Please take these medications as directed.  Because you had a blood clot in your leg you have been started on Eliquis which is a blood thinner.  Typically for first blood clot in an extremity/leg you will require therapy for a total of 3 months.  Your family care physician can make a decision about discontinuing this medication after 3 months.  Typically a follow-up ultrasound of the affected leg is performed at some point.  During the hospitalization he developed acute kidney dysfunction that required short-term dialysis.  Your kidney function has returned to normal but you will need to follow-up at least 1 time with the nephrologist after discharge.  An appointment has been scheduled.   Increase activity slowly   Complete by: As directed      Allergies as of 11/24/2020   No Known Allergies     Medication List    STOP taking these medications   buPROPion 100 MG tablet Commonly known as: WELLBUTRIN   QUEtiapine 400 MG tablet Commonly known as: SEROQUEL     TAKE these medications   amLODipine 10 MG tablet Commonly known as: NORVASC Take 1 tablet (10 mg total) by mouth daily.   apixaban 5 MG Tabs tablet Commonly known as: ELIQUIS Take 1 tablet (5 mg total) by mouth 2 (two) times daily.   carvedilol 25 MG tablet Commonly known as: COREG Take 1 tablet (25 mg total) by mouth 2 (two) times daily with a meal.   cloNIDine 0.1 MG tablet Commonly known as: CATAPRES Take 1 tablet (0.1 mg total) by mouth 2 (two) times daily.   cyanocobalamin 1000 MCG tablet Take 1 tablet (1,000 mcg total) by mouth daily.   multivitamin with minerals Tabs tablet Take 1 tablet by mouth daily.   prazosin 2 MG capsule Commonly known as: MINIPRESS Take 2 capsules (4 mg total) by mouth at bedtime.   senna 8.6 MG Tabs tablet Commonly known as: SENOKOT Take 1 tablet (8.6 mg total) by mouth 2 (two) times daily.   sertraline 50 MG tablet Commonly known as:  ZOLOFT Take 1 tablet (50 mg total) by mouth at bedtime.   traZODone 50 MG tablet Commonly known as: DESYREL Take 1 tablet (50 mg total) by mouth at bedtime.      No Known Allergies  Follow-up Information    Zetta Bills, MD Follow up on 12/12/2020.   Specialty: Nephrology Why: Appointment at 8:30AM Contact information: [address] [place] [address] [phone]        [facility] Follow up.   Why: [facility] will be providing you with PT/OT/ RN.  Please call the [facility] if you need a home health aide for assistance at home. Contact information: [address] [place] [address] [phone]        [facility] [employer]. Schedule an appointment as soon as possible for a visit.   Why: Please call Dr. Willa Rough office at the [place], [place] and follow up in the next 7-10 days for a hospital follow up. Contact information: [address] [place] [address] [phone]                The results of significant diagnostics from this hospitalization (including imaging, microbiology, ancillary and laboratory) are listed below for reference.    Significant Diagnostic Studies: DG Shoulder 1V  Right  Result Date: 10/29/2020 CLINICAL DATA:  Posterior right shoulder pain focus that EXAM: RIGHT SHOULDER - 1 VIEW COMPARISON:  None. FINDINGS: No acute bony abnormality. Specifically, no fracture, subluxation, or dislocation. No radiographic evidence of osteomyelitis. Mild soft tissue swelling about the shoulder. Sheet-like radiodensity projecting over the proximal upper arm, nonspecific, suspect this is external to the patient. Recommend visual inspection. IMPRESSION: 1. Mild soft tissue swelling about the shoulder. No acute osseous abnormality or evidence of osteomyelitis. 2. Some extensive radiodensity projecting over the proximal upper arm, nonspecific, suspect this is external to the patient. Recommend visual inspection. Electronically  Signed   By: Kreg Shropshire M.D.   On: 10/29/2020 23:02   DG Abd 1 View  Result Date: 11/06/2020 CLINICAL DATA:  Abdominal distension EXAM: ABDOMEN - 1 VIEW COMPARISON:  November 05, 2020 FINDINGS: Nasogastric tube tip and side port in stomach. No bowel dilatation or air-fluid level to suggest bowel obstruction. No free air. No abnormal calcifications. IMPRESSION: Nasogastric tube tip and side port in stomach. No bowel dilatation. No findings indicative of bowel obstruction or free air on supine examination. Electronically Signed   By: Bretta Bang III M.D.   On: 11/06/2020 10:47   DG Abd 1 View  Result Date: 11/05/2020 CLINICAL DATA:  Abdominal distension EXAM: ABDOMEN - 1 VIEW COMPARISON:  11/03/2020 FINDINGS: Enteric tube terminates within the gastric body. Persistent air-filled loops of small and large bowel within the abdomen, overall decreased from recent comparison study. No gross free intraperitoneal air on AP supine view. IMPRESSION: Persistent air-filled loops of small and large bowel within the abdomen, overall decreased from recent comparison study. Findings suggestive of ileus or enteritis rather than obstruction. Continued radiographic follow-up recommended. Electronically Signed   By: Duanne Guess D.O.   On: 11/05/2020 12:49   DG Abd 1 View  Result Date: 11/03/2020 CLINICAL DATA:  Abdominal distension and diarrhea EXAM: ABDOMEN - 1 VIEW COMPARISON:  October 27, 2020 FINDINGS: Nasogastric tube tip and side port are in the stomach. There are loops of dilated bowel without appreciable air-fluid levels. No free air. Visualized lung bases clear. IMPRESSION: Nasogastric tube tip and side port in stomach. Bowel dilatation without air-fluid levels. Suspect a degree of ileus or enteritis. Bowel obstruction less likely. No free air evident. Electronically Signed   By: Bretta Bang III M.D.   On: 11/03/2020 15:42   DG Abd 1 View  Result Date: 10/27/2020 CLINICAL DATA:  Check gastric catheter  placement EXAM: ABDOMEN - 1 VIEW COMPARISON:  None. FINDINGS: Gastric catheter is noted within the distal stomach. Nonobstructive bowel gas pattern is seen. IMPRESSION: Gastric catheter within the stomach. Electronically Signed   By: Alcide Clever M.D.   On: 10/27/2020 15:11   CT HEAD WO CONTRAST  Result Date: 10/30/2020 CLINICAL DATA:  Altered mental status. EXAM: CT HEAD WITHOUT CONTRAST TECHNIQUE: Contiguous axial images were obtained from the base of the skull through the vertex without intravenous contrast. COMPARISON:  October 27, 2020 FINDINGS: Brain: No evidence of acute infarction, hemorrhage, hydrocephalus, extra-axial collection or mass lesion/mass effect. Vascular: No hyperdense vessel or unexpected calcification. Skull: Normal. Negative for fracture or focal lesion. Sinuses/Orbits: Very mild proximal right ethmoid sinus mucosal thickening is noted. Other: None. IMPRESSION: No acute intracranial pathology. Electronically Signed   By: Aram Candela M.D.   On: 10/30/2020 19:16   CT Head Wo Contrast  Result Date: 10/27/2020 CLINICAL DATA:  Chest pain with acute hyperactive delirium. EXAM: CT HEAD WITHOUT CONTRAST TECHNIQUE:  Contiguous axial images were obtained from the base of the skull through the vertex without intravenous contrast. COMPARISON:  None. FINDINGS: Brain: No evidence of acute infarction, hemorrhage, hydrocephalus, extra-axial collection or mass lesion/mass effect. Vascular: No hyperdense vessel or unexpected calcification. Skull: Left frontal osteoid osteoma.  No acute osseous abnormality. Sinuses/Orbits: Mucosal thickening of the ethmoid air cells with layering pneumatized secretions in the bilateral sphenoid sinuses. Other: None IMPRESSION: 1. No acute intracranial abnormality. 2. Mucosal thickening of the ethmoid air cells with layering pneumatized secretions in the bilateral sphenoid sinuses. Correlate for acute sinusitis. Electronically Signed   By: Maudry Mayhew MD   On:  10/27/2020 11:47   CT CHEST WO CONTRAST  Result Date: 11/10/2020 CLINICAL DATA:  40 year old male with history of chest pain or shortness of breath. EXAM: CT CHEST WITHOUT CONTRAST TECHNIQUE: Multidetector CT imaging of the chest was performed following the standard protocol without IV contrast. COMPARISON:  CT the chest, abdomen and pelvis 01/06/2021. FINDINGS: Cardiovascular: Heart size is normal. There is no significant pericardial fluid, thickening or pericardial calcification. No atherosclerotic calcifications in the thoracic aorta or the coronary arteries. Central venous catheter with tip terminating in the mid superior vena cava. Mediastinum/Nodes: No pathologically enlarged mediastinal or hilar lymph nodes. Please note that accurate exclusion of hilar adenopathy is limited on noncontrast CT scans. Feeding tube extending in the proximal duodenum. Esophagus is otherwise unremarkable in appearance. Lungs/Pleura: Patchy multifocal peribronchovascular ground-glass attenuation and consolidative airspace disease scattered throughout the lungs bilaterally. Trace right pleural effusion. No left pleural effusion. Upper Abdomen: Unremarkable. Musculoskeletal: There are no aggressive appearing lytic or blastic lesions noted in the visualized portions of the skeleton. IMPRESSION: 1. The appearance of the lungs is most compatible with severe multilobar bilateral bronchopneumonia. 2. Trace right pleural effusion. 3. Support apparatus, as above. Electronically Signed   By: Trudie Reed M.D.   On: 11/10/2020 14:26   MR BRAIN WO CONTRAST  Result Date: 10/31/2020 CLINICAL DATA:  Encephalopathy.  Mental status changes. EXAM: MRI HEAD WITHOUT CONTRAST TECHNIQUE: Multiplanar, multiecho pulse sequences of the brain and surrounding structures were obtained without intravenous contrast. COMPARISON:  Head CT yesterday. FINDINGS: Brain: The brain has a normal appearance without evidence of malformation, atrophy, old or  acute small or large vessel infarction, mass lesion, hemorrhage, hydrocephalus or extra-axial collection. Vascular: Major vessels at the base of the brain show flow. Venous sinuses appear patent. Skull and upper cervical spine: Normal. Sinuses/Orbits: Mucosal thickening of the paranasal sinuses. Small mastoid effusions. Both findings often seen with endotracheal intubation. Orbits negative. Other: None significant. IMPRESSION: 1. Normal appearance of the brain itself. 2. Mucosal thickening of the paranasal sinuses. Small mastoid effusions. Both findings often seen with endotracheal intubation. Electronically Signed   By: Paulina Fusi M.D.   On: 10/31/2020 14:39   US RENAL  Result Date: 10/29/2020 CLINICAL DATA:  Acute renal insufficiency EXAM: RENAL / URINARY TRACT ULTRASOUND COMPLETE COMPARISON:  None. FINDINGS: Right Kidney: Renal measurements: 9.9 x 5.7 x 6.5 cm = volume: 190.6 mL. Echogenicity within normal limits. No mass or hydronephrosis visualized. Left Kidney: Renal measurements: 11.0 x 5.6 x 5.3 cm = volume: 169.0 mL. Echogenicity within normal limits. No mass or hydronephrosis visualized. Bladder: Decompressed with a Foley catheter. Other: None. IMPRESSION: 1. The right kidney is normal in appearance. 2. Visualization of the left kidney is limited but no abnormalities are seen. 3. The bladder is decompressed with a Foley catheter. Electronically Signed   By: Gerome Sam III M.D  On: 10/29/2020 15:41   MR SHOULDER RIGHT WO CONTRAST  Result Date: 10/30/2020 CLINICAL DATA:  Patient admitted 10/27/2020 with acute delirium. Chest and right shoulder pain. Elevated CK. Question septic arthritis. EXAM: MRI OF THE RIGHT SHOULDER WITHOUT CONTRAST TECHNIQUE: Multiplanar, multisequence MR imaging of the shoulder was performed. No intravenous contrast was administered. COMPARISON:  Plain films right shoulder 10/29/2020. FINDINGS: The patient was unable to complete the study. Axial and coronal T2 weighted  imaging and coronal PD weighted imaging is provided. Rotator cuff:  Intact. Muscles: There is intense edema in the infraspinatus and teres minor muscles. Milder degree of edema is seen in the deltoid, worse posteriorly. There is also some edema in the visualized short head of biceps. No focal fluid collection is seen within muscle. There is fluid tracking about fascial planes. Biceps long head:  Intact. Acromioclavicular Joint: Mild osteoarthritis. No joint effusion. There is some fluid in the subacromial/subdeltoid bursa. Glenohumeral Joint: There is no joint effusion. Cartilage surface is preserved. Labrum:  Intact. Bones: Marrow signal is negative. No evidence of osteomyelitis. No fracture or focal bony lesion. Other: None IMPRESSION: Edema in musculature about the shoulder is worst in the infraspinatus and teres minor and consistent with rhabdomyolysis or infectious or inflammatory myositis. No muscle or tendon tear is identified. No intramuscular abscess. Negative for septic joint or osteomyelitis. Electronically Signed   By: Drusilla Kanner M.D.   On: 10/30/2020 09:44   DG Chest Port 1 View  Result Date: 11/19/2020 CLINICAL DATA:  Chest pain. EXAM: PORTABLE CHEST 1 VIEW COMPARISON:  11/11/2020 radiograph and prior studies FINDINGS: UPPER limits normal heart size noted. There is no evidence of focal airspace disease, pulmonary edema, suspicious pulmonary nodule/mass, pleural effusion, or pneumothorax. No acute bony abnormalities are identified. IMPRESSION: UPPER limits normal heart size without evidence of acute cardiopulmonary disease. Electronically Signed   By: Harmon Pier M.D.   On: 11/19/2020 16:13   DG Chest Port 1 View  Result Date: 11/11/2020 CLINICAL DATA:  Tech cardiac chest pain EXAM: PORTABLE CHEST 1 VIEW COMPARISON:  November 10, 2020 FINDINGS: Tracheostomy tube in situ, terminates in the proximal to mid trachea. RIGHT-sided IJ dialysis catheter in place. Tip likely at the proximal SVC or  brachiocephalic junction. Feeding tube courses through in off the field of the radiograph. Cardiomediastinal contours and hilar structures are stable. No lobar consolidation. Patchy opacities and interstitial prominence persist. On limited assessment no acute skeletal process. The EKG leads project over the chest. IMPRESSION: Patchy interstitial and airspace opacities persist compatible with multi lobar pneumonia better seen on prior chest CT. Support devices as described with interval placement of tracheostomy tube. Electronically Signed   By: Donzetta Kohut M.D.   On: 11/11/2020 16:48   Portable Chest xray  Result Date: 11/10/2020 CLINICAL DATA:  Intubated EXAM: PORTABLE CHEST 1 VIEW COMPARISON:  November 09, 2020 FINDINGS: The cardiomediastinal silhouette is unchanged in contour.ETT tip terminates 2 cm above the carina. RIGHT IJ CVC tip terminates over the SVC. The enteric tube courses through the chest to the abdomen beyond the field-of-view. No pleural effusion. No pneumothorax. Similar appearance of bilateral hazy airspace opacities, overall minimally increased since November 06, 2020. Visualized abdomen is unremarkable. No acute osseous abnormality. IMPRESSION: Support apparatus as described above. Electronically Signed   By: Meda Klinefelter MD   On: 11/10/2020 07:55   Portable Chest x-ray  Result Date: 11/09/2020 CLINICAL DATA:  ET tube placement EXAM: PORTABLE CHEST 1 VIEW COMPARISON:  11/09/2020 FINDINGS: Endotracheal tube  is 2.2 cm above the carina. Right central line remains in place, unchanged. Feeding tube is seen entering the stomach. Patchy bilateral airspace disease is similar to prior study. No visible effusions. No pneumothorax. IMPRESSION: Endotracheal tube 2.2 cm above the carina. Bilateral airspace disease, unchanged. Electronically Signed   By: Charlett Nose M.D.   On: 11/09/2020 16:51   DG Chest Port 1 View  Result Date: 11/09/2020 CLINICAL DATA:  Shortness of breath, respiratory  failure EXAM: PORTABLE CHEST 1 VIEW COMPARISON:  11/08/2020 FINDINGS: Right Vas-Cath remains in place, unchanged. Heart is upper limits normal in size. Patchy bilateral ground-glass airspace opacities are again noted, slightly worsened since prior study. No effusions or pneumothorax. No acute bony abnormality. IMPRESSION: Bilateral airspace disease, slightly worsened since prior study. Electronically Signed   By: Charlett Nose M.D.   On: 11/09/2020 10:30   DG CHEST PORT 1 VIEW  Result Date: 11/08/2020 CLINICAL DATA:  Hypoxia. EXAM: PORTABLE CHEST 1 VIEW COMPARISON:  11/06/2020. FINDINGS: Right IJ catheter tip is in the projection of the SVC. Heart size appears normal. Progressive bilateral interstitial and airspace opacities are identified. No pleural effusion. No pneumothorax. IMPRESSION: Progressive bilateral interstitial and airspace opacities compatible with multifocal infection. Electronically Signed   By: Signa Kell M.D.   On: 11/08/2020 17:46   DG CHEST PORT 1 VIEW  Result Date: 11/06/2020 CLINICAL DATA:  Chest pain.  Hypoxia. EXAM: PORTABLE CHEST 1 VIEW COMPARISON:  November 04, 2020 FINDINGS: Endotracheal tube tip is 4.2 cm above the carina. Nasogastric tube tip and side port are below the diaphragm. Central catheter tip is in the superior vena cava. No pneumothorax. Lungs are clear. Heart is upper normal in size with pulmonary vascularity normal. No adenopathy. No bone lesions. IMPRESSION: Tube and catheter positions as described without evident pneumothorax. No edema or airspace opacity. Stable cardiac silhouette. Electronically Signed   By: Bretta Bang III M.D.   On: 11/06/2020 10:46   DG Chest Port 1 View  Result Date: 11/04/2020 CLINICAL DATA:  Hypoxia EXAM: PORTABLE CHEST 1 VIEW COMPARISON:  November 02, 2020 FINDINGS: Endotracheal tube tip is 2.8 cm above the carina. Nasogastric tube tip and side port are below the diaphragm. Central catheter tip is in the superior vena cava. No  pneumothorax. There is no edema or airspace opacity. Heart is upper normal in size with pulmonary vascularity normal. No adenopathy. No bone lesions. IMPRESSION: Tube and catheter positions as described without pneumothorax. No edema or airspace opacity. Stable cardiac prominence. Electronically Signed   By: Bretta Bang III M.D.   On: 11/04/2020 07:54   DG CHEST PORT 1 VIEW  Result Date: 11/02/2020 CLINICAL DATA:  Status post central line placement. EXAM: PORTABLE CHEST 1 VIEW COMPARISON:  Single-view of the chest earlier today. FINDINGS: New right IJ approach central venous catheter tip projects in the mid to upper superior vena cava. No pneumothorax. Endotracheal tube and NG tube are unchanged. Lungs are clear. IMPRESSION: Right IJ catheter tip projects in the mid to upper superior vena cava. No pneumothorax. ETT and NG tube are unchanged. Lungs are clear. Electronically Signed   By: Drusilla Kanner M.D.   On: 11/02/2020 13:14   DG Chest Port 1 View  Result Date: 11/02/2020 CLINICAL DATA:  Acute on chronic respiratory failure EXAM: PORTABLE CHEST 1 VIEW COMPARISON:  10/30/2020 FINDINGS: Endotracheal tube seen 3.3 cm above the carina. Nasogastric tube extends into the upper abdomen beyond the margin of the examination. Lung volumes are small, but are  symmetric. Lungs are clear. No pneumothorax or pleural effusion. Cardiac size within normal limits. Pulmonary vascularity is normal. IMPRESSION: Stable support tubes. Stable pulmonary hypoinflation. Electronically Signed   By: Helyn Numbers MD   On: 11/02/2020 03:44   Portable Chest x-ray  Result Date: 10/30/2020 CLINICAL DATA:  Endotracheal tube present. EXAM: PORTABLE CHEST 1 VIEW COMPARISON:  Chest radiograph 10/27/2020 FINDINGS: Endotracheal tube tip 3.1 cm from the carina. Tip and side port of the enteric tube below the diaphragm in the stomach. Stable heart size and mediastinal contours. Question of small bilateral pleural effusions. Developing  ill-defined bilateral perihilar opacities. No pneumothorax. IMPRESSION: 1. Endotracheal tube tip 3.1 cm from the carina. Enteric tube in place. 2. Developing ill-defined bilateral perihilar opacities may represent atelectasis, pulmonary edema, or airspace disease/pneumonia. Electronically Signed   By: Narda Rutherford M.D.   On: 10/30/2020 20:30   DG Chest Portable 1 View  Result Date: 10/27/2020 CLINICAL DATA:  Chest pain. EXAM: PORTABLE CHEST 1 VIEW COMPARISON:  Same day. FINDINGS: The heart size and mediastinal contours are within normal limits. Endotracheal and nasogastric tubes appear to be in grossly good position. No pneumothorax or pleural effusion is noted. Both lungs are clear. The visualized skeletal structures are unremarkable. IMPRESSION: No active disease. Electronically Signed   By: Lupita Raider M.D.   On: 10/27/2020 09:15   DG Chest Portable 1 View  Result Date: 10/27/2020 CLINICAL DATA:  Intubation EXAM: PORTABLE CHEST 1 VIEW COMPARISON:  Earlier today FINDINGS: Endotracheal tube tip is at the clavicular heads. The enteric tube tip is at the proximal stomach. Low volume chest. There is no edema, consolidation, effusion, or pneumothorax. Normal heart size and mediastinal contours. IMPRESSION: 1. New hardware as described. 2. Low volume chest with symmetric aeration. Electronically Signed   By: Marnee Spring M.D.   On: 10/27/2020 04:24   DG Chest Port 1 View  Result Date: 10/27/2020 CLINICAL DATA:  Chest pain, dizziness EXAM: PORTABLE CHEST 1 VIEW COMPARISON:  None. FINDINGS: Lungs volumes are small, but are symmetric and are clear. No pneumothorax or pleural effusion. Cardiac size within normal limits. Pulmonary vascularity is normal. Osseous structures are age-appropriate. No acute bone abnormality. IMPRESSION: No active disease. Electronically Signed   By: Helyn Numbers MD   On: 10/27/2020 02:17   DG Abd Portable 1V  Result Date: 11/09/2020 CLINICAL DATA:  Abdominal distension  EXAM: PORTABLE ABDOMEN - 1 VIEW COMPARISON:  11/06/2020 FINDINGS: Interval removal of NG tube. Gas throughout mildly prominent stomach and nondistended large and small bowel. No evidence of bowel obstruction. No organomegaly or free air. IMPRESSION: No evidence of bowel obstruction or free air. Interval removal of NG tube with mild gaseous distention of stomach. Electronically Signed   By: Charlett Nose M.D.   On: 11/09/2020 10:29   DG Swallowing Func-Speech Pathology  Result Date: 11/15/2020 Objective Swallowing Evaluation: Type of Study: MBS-Modified Barium Swallow Study  Patient Details Name: OSCAR HANK MRN: [id number] Date of Birth: 04-09-1981 Today's Date: 11/15/2020 Time: SLP Start Time (ACUTE ONLY): 1412 -SLP Stop Time (ACUTE ONLY): 1500 SLP Time Calculation (min) (ACUTE ONLY): 48 min Past Medical History: No past medical history on file. Past Surgical History: No past surgical history on file. HPI: Pt is a 40 y/o male with PMH PTSD, and anxiety. He was admitted 3/2 with two weeks of chest pain.  Pt was emergently intubated 3/2-3/4. EEG suggested moderate, diffuse encephalopathy without seizure activity. Found to be in rhabdo with AKI. Pt reintubated  3/6-3/14 for airway protection due to hypoactive delirium. BSE 3/15: no signs of aspiration or dysphagia. He both passed a 3 oz water swallow with RN and SLP. A clear liquid diet had been started by MD at that time and SLP recommended advancement per critical care. Episode of desaturation after eating on 3/15, MD suspected silent aspiration. Vasovagal event with ice water. Pt re-intubated 3/17-trach 3/18.  CXR 3/18: Patchy interstitial and airspace opacities persist compatible with multi lobar pneumonia.  Subjective: alert Assessment / Plan / Recommendation CHL IP CLINICAL IMPRESSIONS 11/15/2020 Clinical Impression Pt presented with normal oropharyngeal function across all consistencies. There was adequate mastication of solids, timely swallow trigger, and  reliable laryngeal vestibule closure. There was NO ASPIRATION. Pharyngeal stripping was normal and there was no residue post-swallow.  Pt used PMV for most of study - it was removed and he was tested with thin liquids for the last few trials to determine if its absence altered the physiology. He demonstrated one incident of penetration with the valve off.  Recommend beginning a dysphagia 3 diet with thin liquids. Give meds whole in puree. Use valve when eating/drinking to restore cough and benefits for airway protection. D/W pt and RN.  Pt was very happy with results and consumed 8 oz of orange juice after he returned to his room.  SLP will follow briefly to ensure safety/toleration. SLP Visit Diagnosis Dysphagia, oropharyngeal phase (R13.12) Attention and concentration deficit following -- Frontal lobe and executive function deficit following -- Impact on safety and function Mild aspiration risk   CHL IP TREATMENT RECOMMENDATION 11/15/2020 Treatment Recommendations Therapy as outlined in treatment plan below   Prognosis 11/15/2020 Prognosis for Safe Diet Advancement Good Barriers to Reach Goals -- Barriers/Prognosis Comment -- CHL IP DIET RECOMMENDATION 11/15/2020 SLP Diet Recommendations Dysphagia 3 (Mech soft) solids;Thin liquid Liquid Administration via Cup;Straw Medication Administration Whole meds with puree Compensations Minimize environmental distractions Postural Changes --   CHL IP OTHER RECOMMENDATIONS 11/15/2020 Recommended Consults -- Oral Care Recommendations Oral care BID Other Recommendations Place PMSV during PO intake   CHL IP FOLLOW UP RECOMMENDATIONS 11/15/2020 Follow up Recommendations Inpatient Rehab   CHL IP FREQUENCY AND DURATION 11/15/2020 Speech Therapy Frequency (ACUTE ONLY) min 2x/week Treatment Duration 2 weeks      CHL IP ORAL PHASE 11/15/2020 Oral Phase WFL Oral - Pudding Teaspoon -- Oral - Pudding Cup -- Oral - Honey Teaspoon -- Oral - Honey Cup -- Oral - Nectar Teaspoon -- Oral - Nectar Cup  -- Oral - Nectar Straw -- Oral - Thin Teaspoon -- Oral - Thin Cup -- Oral - Thin Straw -- Oral - Puree -- Oral - Mech Soft -- Oral - Regular -- Oral - Multi-Consistency -- Oral - Pill -- Oral Phase - Comment --  CHL IP PHARYNGEAL PHASE 11/15/2020 Pharyngeal Phase WFL Pharyngeal- Pudding Teaspoon -- Pharyngeal -- Pharyngeal- Pudding Cup -- Pharyngeal -- Pharyngeal- Honey Teaspoon -- Pharyngeal -- Pharyngeal- Honey Cup -- Pharyngeal -- Pharyngeal- Nectar Teaspoon -- Pharyngeal -- Pharyngeal- Nectar Cup -- Pharyngeal -- Pharyngeal- Nectar Straw -- Pharyngeal -- Pharyngeal- Thin Teaspoon -- Pharyngeal -- Pharyngeal- Thin Cup -- Pharyngeal -- Pharyngeal- Thin Straw -- Pharyngeal -- Pharyngeal- Puree -- Pharyngeal -- Pharyngeal- Mechanical Soft -- Pharyngeal -- Pharyngeal- Regular -- Pharyngeal -- Pharyngeal- Multi-consistency -- Pharyngeal -- Pharyngeal- Pill -- Pharyngeal -- Pharyngeal Comment --  CHL IP CERVICAL ESOPHAGEAL PHASE 11/15/2020 Cervical Esophageal Phase WFL Pudding Teaspoon -- Pudding Cup -- Honey Teaspoon -- Honey Cup -- Nectar Teaspoon -- Nectar Cup --  Nectar Straw -- Thin Teaspoon -- Thin Cup -- Thin Straw -- Puree -- Mechanical Soft -- Regular -- Multi-consistency -- Pill -- Cervical Esophageal Comment -- Carolan Shiver 11/15/2020, 3:11 PM              EEG adult  Result Date: 10/27/2020 Charlsie Quest, MD     10/27/2020  1:59 PM Patient Name: ALYSSA MANCERA MRN: [id number] Epilepsy Attending: Charlsie Quest Referring Physician/Provider: Dr. Max Fickle Date: 10/27/2020 Duration: 23.58 mins Patient history: 40 year old male with altered mental status.  EEG to evaluate for seizures. Level of alertness: comatose AEDs during EEG study: Propofol Technical aspects: This EEG study was done with scalp electrodes positioned according to the 10-20 International system of electrode placement. Electrical activity was acquired at a sampling rate of 500Hz  and reviewed with a high frequency filter of  70Hz  and a low frequency filter of 1Hz . EEG data were recorded continuously and digitally stored. Description: EEG showed continuous generalized 3 to 5 Hz theta-delta slowing admixed with excessive amount of 15 to 18 Hz,  beta activity distributed symmetrically and diffusely. Hyperventilation and photic stimulation were not performed.   ABNORMALITY -Excessive beta, generalized -Continuous slow, generalized IMPRESSION: This study is suggestive of moderate diffuse encephalopathy, nonspecific etiology. No seizures or epileptiform discharges were seen throughout the recording. Charlsie Quest   ECHOCARDIOGRAM COMPLETE  Result Date: 10/27/2020    ECHOCARDIOGRAM REPORT   Patient Name:   AYUSH BOULET Date of Exam: 10/27/2020 Medical Rec #:  [id number]          Height:       70.0 in Accession #:    [id number]         Weight:       225.0 lb Date of Birth:  [birth year]           BSA:          2.194 m Patient Age:    39 years           BP:           155/109 mmHg Patient Gender: M                  HR:           86 bpm. Exam Location:  Inpatient Procedure: 2D Echo, Cardiac Doppler, Color Doppler, Strain Analysis and            Intracardiac Opacification Agent Indications:    Chest Pain R07.9  History:        Patient has no prior history of Echocardiogram examinations.                 Chest pain that developed acute hyperactive delirium.  Sonographer:    Leta Jungling RDCS Referring Phys: [id number] CHRISTOPHER R DOROTHY IMPRESSIONS  1. Left ventricular ejection fraction, by estimation, is 55 to 60%. The left ventricle has normal function. The left ventricle has no regional wall motion abnormalities. Left ventricular diastolic parameters were normal.  2. Right ventricular systolic function is normal. The right ventricular size is normal.  3. The mitral valve is grossly normal. Trivial mitral valve regurgitation.  4. The aortic valve is tricuspid. Aortic valve regurgitation is not visualized. Comparison(s): No prior  Echocardiogram. FINDINGS  Left Ventricle: Left ventricular ejection fraction, by estimation, is 55 to 60%. The left ventricle has normal function. The left ventricle has no regional wall motion abnormalities. Definity contrast agent was given IV to delineate the left ventricular  endocardial borders. The left ventricular internal cavity size was normal in size. There is no left ventricular hypertrophy. Left ventricular diastolic parameters were normal. Right Ventricle: The right ventricular size is normal. No increase in right ventricular wall thickness. Right ventricular systolic function is normal. Left Atrium: Left atrial size was normal in size. Right Atrium: Right atrial size was normal in size. Pericardium: There is no evidence of pericardial effusion. Mitral Valve: The mitral valve is grossly normal. There is mild thickening of the mitral valve leaflet(s). Trivial mitral valve regurgitation. Tricuspid Valve: The tricuspid valve is normal in structure. Tricuspid valve regurgitation is trivial. Aortic Valve: The aortic valve is tricuspid. Aortic valve regurgitation is not visualized. Pulmonic Valve: The pulmonic valve was normal in structure. Pulmonic valve regurgitation is not visualized. Aorta: The aortic root and ascending aorta are structurally normal, with no evidence of dilitation. IAS/Shunts: No atrial level shunt detected by color flow Doppler.  LEFT VENTRICLE PLAX 2D LVIDd:         4.00 cm      Diastology LVIDs:         2.60 cm      LV e' medial:    8.70 cm/s LV PW:         1.00 cm      LV E/e' medial:  6.6 LV IVS:        0.80 cm      LV e' lateral:   8.05 cm/s LVOT diam:     2.00 cm      LV E/e' lateral: 7.1 LV SV:         41 LV SV Index:   18 LVOT Area:     3.14 cm  LV Volumes (MOD) LV vol d, MOD A2C: 103.0 ml LV vol d, MOD A4C: 95.2 ml LV vol s, MOD A2C: 46.3 ml LV vol s, MOD A4C: 39.4 ml LV SV MOD A2C:     56.7 ml LV SV MOD A4C:     95.2 ml LV SV MOD BP:      57.3 ml RIGHT VENTRICLE RV S prime:      13.70 cm/s TAPSE (M-mode): 1.4 cm LEFT ATRIUM             Index       RIGHT ATRIUM          Index LA diam:        2.70 cm 1.23 cm/m  RA Area:     9.66 cm LA Vol (A2C):   17.7 ml 8.07 ml/m  RA Volume:   17.30 ml 7.88 ml/m LA Vol (A4C):   26.8 ml 12.21 ml/m LA Biplane Vol: 23.4 ml 10.66 ml/m  AORTIC VALVE LVOT Vmax:   73.60 cm/s LVOT Vmean:  49.300 cm/s LVOT VTI:    0.129 m  AORTA Ao Root diam: 3.40 cm Ao Asc diam:  3.00 cm MITRAL VALVE MV Area (PHT): 5.46 cm    SHUNTS MV Decel Time: 139 msec    Systemic VTI:  0.13 m MV E velocity: 57.30 cm/s  Systemic Diam: 2.00 cm MV A velocity: 56.60 cm/s MV E/A ratio:  1.01 Laurance Flatten MD Electronically signed by Laurance Flatten MD Signature Date/Time: 10/27/2020/12:11:25 PM    Final    VAS [place] LOWER EXTREMITY VENOUS (DVT)  Result Date: 11/08/2020  Lower Venous DVT Study Indications: Edema, and Suspected PE.  Risk Factors: Rhabdomyolysis. Comparison Study: No previous exams. Performing Technologist: Ernestene Mention  Examination Guidelines: A complete evaluation includes B-mode imaging,  spectral Doppler, color Doppler, and power Doppler as needed of all accessible portions of each vessel. Bilateral testing is considered an integral part of a complete examination. Limited examinations for reoccurring indications may be performed as noted. The reflux portion of the exam is performed with the patient in reverse Trendelenburg.  +---------+---------------+---------+-----------+----------+-------------------+ RIGHT    CompressibilityPhasicitySpontaneityPropertiesThrombus Aging      +---------+---------------+---------+-----------+----------+-------------------+ CFV      Full           Yes      Yes                                      +---------+---------------+---------+-----------+----------+-------------------+ SFJ      Full                                                              +---------+---------------+---------+-----------+----------+-------------------+ FV Prox  Full           Yes      Yes                                      +---------+---------------+---------+-----------+----------+-------------------+ FV Mid   Full           Yes      Yes                                      +---------+---------------+---------+-----------+----------+-------------------+ FV DistalFull           Yes      Yes                                      +---------+---------------+---------+-----------+----------+-------------------+ PFV      Full                                                             +---------+---------------+---------+-----------+----------+-------------------+ POP      Full           Yes      Yes                                      +---------+---------------+---------+-----------+----------+-------------------+ PTV      Full                                                             +---------+---------------+---------+-----------+----------+-------------------+ PERO     Full                                                             +---------+---------------+---------+-----------+----------+-------------------+  Gastroc  Partial        No       No                   Acute - one of                                                            paired gastroc      +---------+---------------+---------+-----------+----------+-------------------+   +---------+---------------+---------+-----------+----------+--------------+ LEFT     CompressibilityPhasicitySpontaneityPropertiesThrombus Aging +---------+---------------+---------+-----------+----------+--------------+ CFV      Full           Yes      Yes                                 +---------+---------------+---------+-----------+----------+--------------+ SFJ      Full                                                         +---------+---------------+---------+-----------+----------+--------------+ FV Prox  Full           Yes      Yes                                 +---------+---------------+---------+-----------+----------+--------------+ FV Mid   Full           Yes      Yes                                 +---------+---------------+---------+-----------+----------+--------------+ FV DistalFull           Yes      Yes                                 +---------+---------------+---------+-----------+----------+--------------+ PFV      Full                                                        +---------+---------------+---------+-----------+----------+--------------+ POP      Full           Yes      Yes                                 +---------+---------------+---------+-----------+----------+--------------+ PTV      Full                                                        +---------+---------------+---------+-----------+----------+--------------+ PERO     Full                                                        +---------+---------------+---------+-----------+----------+--------------+  Gastroc  None           No       No                   Acute          +---------+---------------+---------+-----------+----------+--------------+     Summary: BILATERAL: -No evidence of popliteal cyst, bilaterally. RIGHT: - Findings consistent with acute deep vein thrombosis involving one of the paired right gastrocnemius veins.  LEFT: - Findings consistent with acute deep vein thrombosis involving the left gastrocnemius veins.  *See table(s) above for measurements and observations. Electronically signed by Waverly Ferrari MD on 11/08/2020 at 12:37:47 PM.    Final    ECHOCARDIOGRAM LIMITED  Result Date: 11/07/2020    ECHOCARDIOGRAM LIMITED REPORT   Patient Name:   KEYLAN COSTABILE Carrol Date of Exam: 11/07/2020 Medical Rec #:  [id number]          Height:       70.0 in Accession #:     [id number]         Weight:       253.5 lb Date of Birth:  [birth year]           BSA:          2.309 m Patient Age:    40 years           BP:           186/101 mmHg Patient Gender: M                  HR:           128 bpm. Exam Location:  Inpatient Procedure: Cardiac Doppler and Color Doppler Indications:    Pulmonary embolus  History:        Patient has prior history of Echocardiogram examinations, most                 recent 10/27/2020. Signs/Symptoms:Chest Pain; Risk                 Factors:Hypertension. Acute bilateral DVT.  Sonographer:    Ross Ludwig RDCS (AE) Referring Phys: [id number] Steffanie Dunn IMPRESSIONS  1. Left ventricular ejection fraction, by estimation, is 60 to 65%. The left ventricle has normal function. The left ventricle has no regional wall motion abnormalities.  2. Pt has acute bilateral DVT. There is no evidence of RV strain or enlargement by echo to suggest a large pulmonary embolus. Suggest alternative imaging for pulmonary embolus if not already performed. I note that his creatinine is elevated. Consider VQ  scan .Marland Kitchen Right ventricular systolic function is normal. The right ventricular size is normal.  3. The mitral valve is normal in structure. No evidence of mitral valve regurgitation. No evidence of mitral stenosis.  4. The aortic valve is normal in structure. Aortic valve regurgitation is not visualized. No aortic stenosis is present. FINDINGS  Left Ventricle: Left ventricular ejection fraction, by estimation, is 60 to 65%. The left ventricle has normal function. The left ventricle has no regional wall motion abnormalities. The left ventricular internal cavity size was normal in size. Right Ventricle: Pt has acute bilateral DVT. There is no evidence of RV strain or enlargement by echo to suggest a large pulmonary embolus. Suggest alternative imaging for pulmonary embolus if not already performed. I note that his creatinine is elevated. Consider VQ scan. The right ventricular size is normal.  Right ventricular systolic function is normal. Mitral Valve: The mitral valve is normal  in structure. No evidence of mitral valve stenosis. Aortic Valve: The aortic valve is normal in structure. Aortic valve regurgitation is not visualized. No aortic stenosis is present. LEFT VENTRICLE PLAX 2D LVOT diam:     2.10 cm LVOT Area:     3.46 cm  RIGHT VENTRICLE         IVC TAPSE (M-mode): 2.8 cm  IVC diam: 2.30 cm  AORTA Ao Root diam: 3.30 cm  SHUNTS Systemic Diam: 2.10 cm Kristeen Miss MD Electronically signed by Kristeen Miss MD Signature Date/Time: 11/07/2020/5:23:17 PM    Final    CT CHEST ABDOMEN PELVIS WO CONTRAST  Result Date: 11/06/2020 CLINICAL DATA:  Sudden onset chest and abdominal pain EXAM: CT CHEST, ABDOMEN AND PELVIS WITHOUT CONTRAST TECHNIQUE: Multidetector CT imaging of the chest, abdomen and pelvis was performed following the standard protocol without IV contrast. COMPARISON:  None. FINDINGS: CT CHEST FINDINGS Cardiovascular: No significant vascular findings. Normal heart size. No pericardial effusion. Mediastinum/Nodes: No enlarged mediastinal, hilar, or axillary lymph nodes. Thyroid gland, trachea, and esophagus demonstrate no significant findings. Lungs/Pleura: Endotracheal intubation. Predominantly dependent heterogeneous and consolidative airspace opacity throughout the lungs. Small bilateral pleural effusions. Interlobular septal thickening. Musculoskeletal: No chest wall mass or suspicious bone lesions identified. CT ABDOMEN PELVIS FINDINGS Hepatobiliary: No solid liver abnormality is seen. No gallstones, gallbladder wall thickening, or biliary dilatation. Pancreas: Unremarkable. No pancreatic ductal dilatation or surrounding inflammatory changes. Spleen: Normal in size without significant abnormality. Adrenals/Urinary Tract: Adrenal glands are unremarkable. Kidneys are normal, without renal calculi, solid lesion, or hydronephrosis. Bladder is unremarkable. Stomach/Bowel: Stomach is within  normal limits. Appendix appears normal. No evidence of bowel wall thickening, distention, or inflammatory changes. Rectal tube in position. Vascular/Lymphatic: No significant vascular findings are present. No enlarged abdominal or pelvic lymph nodes. Reproductive: No mass or other abnormality. Other: Anasarca.  Small volume ascites. Musculoskeletal: No acute or significant osseous findings. IMPRESSION: 1. Predominantly dependent heterogeneous and consolidative airspace opacity throughout the lungs, with small bilateral pleural effusions. Interlobular septal thickening. Findings are consistent with multifocal infection, edema, and/or ARDS. 2. Endotracheal intubation. 3. Small volume ascites and anasarca. 4. No non-contrast CT findings of the abdomen or pelvis to explain abdominal pain. Electronically Signed   By: Lauralyn Primes M.D.   On: 11/06/2020 12:14    Microbiology: No results found for this or any previous visit (from the past 240 hour(s)).   Labs: Basic Metabolic Panel: Recent Labs  Lab 11/18/20 0122 11/19/20 0127 11/20/20 0227 11/22/20 0105  NA 138 139 136 138  K 3.9 4.1 3.8 4.1  CL 104 106 101 102  CO2 24 23 24 27   GLUCOSE 113* 110* 110* 110*  BUN 15 17 13 10   CREATININE 1.14 1.17 1.13 0.97  CALCIUM 9.3 9.4 9.3 9.5  MG 1.8  --  1.7  --   PHOS  --   --  3.8  --    Liver Function Tests: Recent Labs  Lab 11/20/20 0227  AST 90*  ALT 34  ALKPHOS 62  BILITOT 0.3  PROT 7.4  ALBUMIN 3.6   No results for input(s): LIPASE, AMYLASE in the last 168 hours. No results for input(s): AMMONIA in the last 168 hours. CBC: Recent Labs  Lab 11/18/20 0122 11/20/20 0227  WBC 7.5 8.6  HGB 9.3* 9.3*  HCT 28.9* 28.5*  MCV 94.4 94.4  PLT 563* 589*   Cardiac Enzymes: Recent Labs  Lab 11/19/20 0127 11/20/20 0227 11/21/20 0241 11/22/20 0105 11/23/20 0222  CKTOTAL 1,665*  1,920* 1,808* 914* 1,267*   BNP: BNP (last 3 results) Recent Labs    11/19/20 1558  BNP 11.4    ProBNP  (last 3 results) No results for input(s): PROBNP in the last 8760 hours.  CBG: Recent Labs  Lab 11/18/20 0321 11/18/20 0801 11/18/20 1104 11/21/20 0249 11/21/20 0729  GLUCAP 106* 133* 123* 101* 107*       Signed:  Junious Silk ANP [facility] 11/24/2020, 9:46 AM

## 2020-11-24 NOTE — Progress Notes (Signed)
Pt given discharge instructions, prescriptions, and care notes. Pt verbalized understanding AEB no further questions or concerns at this time. IV was discontinued, no redness, pain, or swelling noted at this time. Telemetry discontinued and Centralized Telemetry was notified. Pt left the floor via wheelchair with staff in stable condition. 

## 2020-11-24 NOTE — TOC Transition Note (Signed)
Transition of Care [facility]) - CM/SW Discharge Note   Patient Details  Name: Andrew [name] MRN: [id number] Date of Birth: [birth year]  [facility]) CM/SW Contact:  Curlene Labrum, RN Phone Number: 11/24/2020, 10:01 AM   Clinical Narrative:    Case management met with the patient at the bedside to discuss transitions of care to home.  The patient is planning to discharge home today by car with his fiance, [place].  The patient was updated on discharge instructions for home including follow up with [facility] in [place], [place] for hospital follow up.  I called and cancelled the follow up appointment with [facility] since admitting was unaware that patient had VA benefits.  Transitions of care pharmacy delivered discharge medications to the patient's hospital room and the rest of patient's discharge medications will be handled through the [facility] and patient is aware.  His medications are routinely delivered to the patient's home by mail.  I called [facility] admitting and asked that they follow up with the patient and fiance with insurance benefits through the [place] so this information can be placed in the patient's chart.  Once discharge paperwork has been completed in Epic by Erin Hearing, NP, the patient's fiance can be contacted to transport the patient home by car today.    Final next level of care: [place] Barriers to Discharge: Continued Medical Work up   Patient Goals and CMS Choice Patient states their goals for this hospitalization and ongoing recovery are:: Patient wants to discharge home with family supports. CMS Medicare.gov Compare Post Acute Care list provided to:: Patient Choice offered to / list presented to : Patient  Discharge Placement                       Discharge Plan and Services In-house Referral: Clinical Social Work Discharge Planning Services: [facility] Program,Medication Assistance Post Acute Care  Choice: Home Health                    HH Arranged: RN,PT,OT,Nurse's Aide [place]: [place] ([facility]) Date HH Agency Contacted: 11/22/20 Time [place]: 1243 Representative spoke with at [place]: Ramond Marrow, [place] at [place] ([place]) Interventions     Readmission Risk Interventions Readmission Risk Prevention Plan 11/22/2020 11/18/2020  Transportation Screening Complete Complete  PCP or Specialist Appt within 5-7 Days - Complete  PCP or Specialist Appt within 3-5 Days Complete -  Home Care Screening - Complete  Medication Review (RN CM) - Complete  HRI or Home Care Consult Complete -  Social Work Consult for [place] Planning/Counseling Complete -  Palliative Care Screening Complete -  Medication Review Press photographer) Complete -  Some recent data might be hidden

## 2020-12-01 ENCOUNTER — Inpatient Hospital Stay: Payer: Self-pay | Admitting: Physician Assistant

## 2020-12-22 ENCOUNTER — Other Ambulatory Visit (HOSPITAL_COMMUNITY): Payer: Self-pay

## 2021-03-09 ENCOUNTER — Emergency Department (HOSPITAL_BASED_OUTPATIENT_CLINIC_OR_DEPARTMENT_OTHER)
Admission: EM | Admit: 2021-03-09 | Discharge: 2021-03-09 | Disposition: A | Discharge status: Home / Self Care | Payer: No Typology Code available for payment source | Attending: Emergency Medicine | Admitting: Emergency Medicine

## 2021-03-09 ENCOUNTER — Other Ambulatory Visit: Payer: Self-pay

## 2021-03-09 ENCOUNTER — Encounter (HOSPITAL_BASED_OUTPATIENT_CLINIC_OR_DEPARTMENT_OTHER): Payer: Self-pay

## 2021-03-09 ENCOUNTER — Ambulatory Visit
Admission: EM | Admit: 2021-03-09 | Discharge: 2021-03-09 | Disposition: A | Discharge status: Home / Self Care | Payer: No Typology Code available for payment source | Attending: Nurse Practitioner | Admitting: Nurse Practitioner

## 2021-03-09 DIAGNOSIS — Z5321 Procedure and treatment not carried out due to patient leaving prior to being seen by health care provider: Secondary | ICD-10-CM | POA: Insufficient documentation

## 2021-03-09 DIAGNOSIS — R059 Cough, unspecified: Secondary | ICD-10-CM | POA: Diagnosis not present

## 2021-03-09 DIAGNOSIS — J209 Acute bronchitis, unspecified: Secondary | ICD-10-CM | POA: Diagnosis not present

## 2021-03-09 HISTORY — DX: Essential (primary) hypertension: I10

## 2021-03-09 HISTORY — DX: Post-traumatic stress disorder, unspecified: F43.10

## 2021-03-09 MED ORDER — ALBUTEROL SULFATE HFA 108 (90 BASE) MCG/ACT IN AERS: 1.0000 | INHALATION_SPRAY | Freq: Four times a day (QID) | RESPIRATORY_TRACT | 0 refills | Status: DC | PRN

## 2021-03-09 MED ORDER — CEFDINIR 300 MG PO CAPS: 300.0000 mg | ORAL_CAPSULE | Freq: Two times a day (BID) | ORAL | 0 refills | Status: DC

## 2021-03-09 MED ORDER — PREDNISONE 10 MG (21) PO TBPK: ORAL_TABLET | ORAL | 0 refills | Status: DC

## 2021-03-09 MED ORDER — PROMETHAZINE-DM 6.25-15 MG/5ML PO SYRP: 5.0000 mL | ORAL_SOLUTION | Freq: Four times a day (QID) | ORAL | 0 refills | Status: DC | PRN

## 2021-03-09 NOTE — ED Provider Notes (Signed)
[facility]    CSN: [id number] Arrival date & time: 03/09/21  1832      History   Chief Complaint Chief Complaint  Patient presents with   Nasal Congestion   Cough    HPI Esaw NIKLAS CHRETIEN is a 40 y.o. male.   Subjective:   Hady SHAMEER MOLSTAD is a 40 y.o. male here for evaluation of a cough. Onset of symptoms was 1 week ago and has been unchanged since that time.  Associated symptoms include sputum production, wheezing, chest congestion and runny nose.  Patient denies any fevers, chills, body aches, sore throat, nausea, vomiting, headache or dizziness.  Patient does not have a history of asthma. Patient has not had recent travel. Patient does have a history of smoking. Patient  has not had a previous chest x-ray.  Denies any history of COVID-19.  He has not been vaccinated against COVID-19.  He took a home COVID test earlier today which was negative.  He has been taking Mucinex DM twice daily without any relief in his symptoms.  The following portions of the patient's history were reviewed and updated as appropriate: allergies, current medications, past family history, past medical history, past social history, past surgical history, and problem list.     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 No pertinent surgical history.     Home Medications    Prior  to Admission medications   Medication Sig Start Date End Date Taking? Authorizing Provider  albuterol (VENTOLIN HFA) 108 (90 Base) MCG/ACT inhaler Inhale 1-2 puffs into the lungs every 6 (six) hours as needed for wheezing or shortness of breath. 03/09/21  Yes Lurline Idol, FNP  cefdinir (OMNICEF) 300 MG capsule Take 1 capsule (300 mg total) by mouth 2 (two) times daily. 03/09/21  Yes Lurline Idol, FNP  predniSONE (STERAPRED UNI-PAK 21 TAB) 10 MG (21) TBPK tablet Take as directed 03/09/21  Yes Tammela Bales, Lelon Mast, FNP  promethazine-dextromethorphan (PROMETHAZINE-DM) 6.25-15 MG/5ML syrup Take 5 mLs by mouth 4 (four) times daily as needed for cough. 03/09/21  Yes Lurline Idol, FNP  amLODipine (NORVASC) 10 MG tablet Take 1 tablet (10 mg total) by mouth daily. 11/24/20   Russella Dar, NP  amLODipine (NORVASC) 10 MG tablet TAKE 1 TABLET (10 MG TOTAL) BY MOUTH DAILY. 11/23/20 11/23/21  Russella Dar, NP  apixaban (ELIQUIS) 5 MG TABS tablet Take 1 tablet (5 mg total) by mouth 2 (two) times daily. 11/23/20   Russella Dar, NP  apixaban (ELIQUIS) 5 MG TABS tablet TAKE 1 TABLET (5 MG TOTAL) BY MOUTH TWO TIMES DAILY. 11/23/20 11/23/21  Russella Dar, NP  carvedilol (COREG) 25 MG tablet Take 1 tablet (25 mg total) by mouth 2 (two) times daily with a meal. 11/23/20   Russella Dar, NP  carvedilol (COREG) 25 MG  tablet TAKE 1 TABLET (25 MG TOTAL) BY MOUTH TWO TIMES DAILY WITH A MEAL. 11/23/20 11/23/21  Russella Dar, NP  cloNIDine (CATAPRES) 0.1 MG tablet Take 1 tablet (0.1 mg total) by mouth 2 (two) times daily. 11/23/20   Russella Dar, NP  cloNIDine (CATAPRES) 0.1 MG tablet TAKE 1 TABLET (0.1 MG TOTAL) BY MOUTH TWO TIMES DAILY. 11/23/20 11/23/21  Russella Dar, NP  Multiple Vitamin (MULTIVITAMIN WITH MINERALS) TABS tablet Take 1 tablet by mouth daily. 11/24/20   Russella Dar, NP  prazosin (MINIPRESS) 1 MG capsule TAKE 4 CAPSULES (4 MG TOTAL) BY MOUTH AT BEDTIME. 11/23/20 11/23/21  Russella Dar, NP  prazosin (MINIPRESS) 2 MG capsule Take 2 capsules (4 mg total) by mouth at bedtime. 11/23/20   Russella Dar, NP  senna (SENOKOT) 8.6 MG TABS tablet Take 1 tablet (8.6 mg total) by mouth 2 (two) times daily. 11/23/20   Russella Dar, NP  senna (SENOKOT) 8.6 MG TABS tablet TAKE 1 TABLET (8.6 MG TOTAL) BY MOUTH TWO TIMES DAILY. 11/23/20 11/23/21  Russella Dar, NP  sertraline (ZOLOFT) 50 MG tablet Take 1 tablet (50 mg total) by mouth at bedtime. 11/23/20   Russella Dar, NP  sertraline (ZOLOFT) 50 MG tablet TAKE 1 TABLET (50 MG TOTAL) BY MOUTH AT BEDTIME. 11/23/20 11/23/21  Russella Dar, NP  traZODone (DESYREL) 50 MG tablet Take 1 tablet (50 mg total) by mouth at bedtime. 11/23/20   Russella Dar, NP  traZODone (DESYREL) 50 MG tablet TAKE 1 TABLET (50 MG TOTAL) BY MOUTH AT BEDTIME. 11/23/20 11/23/21  Russella Dar, NP  vitamin B-12 (CYANOCOBALAMIN) 1000 MCG tablet TAKE 1 TABLET (1,000 MCG TOTAL) BY MOUTH DAILY. 11/23/20 11/23/21  Russella Dar, NP  vitamin B-12 1000 MCG tablet Take 1 tablet (1,000 mcg total) by mouth daily. 11/24/20   Russella Dar, NP    Family History History reviewed. No pertinent family history.  Social History Social History   Tobacco Use   Smoking status: Former    Types: Cigarettes   Smokeless tobacco: Never  Vaping Use   Vaping Use: Some days  Substance Use Topics   Alcohol use: Never   Drug use: Never     Allergies   Patient has no known allergies.   Review of Systems Review of Systems  Constitutional:  Negative for fever.  Respiratory:  Positive for cough, shortness of breath and wheezing.   Gastrointestinal:  Negative for nausea and vomiting.  Neurological:  Negative for dizziness.  All other systems reviewed and are negative.   Physical Exam Triage Vital Signs ED Triage Vitals  Enc Vitals Group     BP 03/09/21 1956 (!) 143/84     Pulse Rate 03/09/21 1956 84     Resp 03/09/21 1956 18     Temp 03/09/21 1956 98.1  F (36.7 C)     Temp Source 03/09/21 1956 Oral     SpO2 03/09/21 1956 95 %     Weight --      Height --      Head Circumference --      Peak Flow --      Pain Score 03/09/21 2000 0     Pain Loc --      Pain Edu? --      Excl. in GC? --    No data found.  Updated Vital Signs BP (!) 143/84 (BP Location: Right Arm)   Pulse 84  Temp 98.1 F (36.7 C) (Oral)   Resp 18   SpO2 95%   Visual Acuity Right Eye Distance:   Left Eye Distance:   Bilateral Distance:    Right Eye Near:   Left Eye Near:    Bilateral Near:     Physical Exam Vitals reviewed.  Constitutional:      General: He is not in acute distress.    Appearance: Normal appearance. He is not ill-appearing, toxic-appearing or diaphoretic.  HENT:     Head: Normocephalic.     Mouth/Throat:     Mouth: Mucous membranes are moist.  Eyes:     Conjunctiva/sclera: Conjunctivae normal.  Cardiovascular:     Rate and Rhythm: Normal rate and regular rhythm.  Pulmonary:     Effort: Pulmonary effort is normal. No respiratory distress.     Breath sounds: Wheezing present.  Musculoskeletal:        General: Normal range of motion.     Cervical back: Normal range of motion and neck supple.  Lymphadenopathy:     Cervical: No cervical adenopathy.  Skin:    General: Skin is warm and dry.  Neurological:     General: No focal deficit present.     Mental Status: He is alert and oriented to person, place, and time.  Psychiatric:        Mood and Affect: Mood normal.        Behavior: Behavior normal.     UC Treatments / Results  Labs (all labs ordered are listed, but only abnormal results are displayed) Labs Reviewed - No data to display  EKG   Radiology No results found.  Procedures Procedures (including critical care time)  Medications Ordered in UC Medications - No data to display  Initial Impression / Assessment and Plan / UC Course  I have reviewed the triage vital signs and the nursing notes.  Pertinent  labs & imaging results that were available during my care of the patient were reviewed by me and considered in my medical decision making (see chart for details).      40 year old male presenting with a 1 week history of productive cough, wheezing, chest congestion and runny nose.  Patient is afebrile.  Vital signs stable.  Mild expiratory wheezing noted on exam.  No acute distress.  Will treat for acute bronchitis.   Plan: Continue the Mucinex per label instructions Antibiotics, steroids and cough medicine (PRN) per medication orders. B-agonist inhaler PRN  Smoking cessation advised Follow-up as needed Go to the ED if worse  Today's evaluation has revealed no signs of a dangerous process. Discussed diagnosis with patient and/or guardian. Patient and/or guardian aware of their diagnosis, possible red flag symptoms to watch out for and need for close follow up. Patient and/or guardian understands verbal and written discharge instructions. Patient and/or guardian comfortable with plan and disposition.  Patient and/or guardian has a clear mental status at this time, good insight into illness (after discussion and teaching) and has clear judgment to make decisions regarding their care  This care was provided during an unprecedented National Emergency due to the Novel Coronavirus (COVID-19) pandemic. COVID-19 infections and transmission risks place heavy strains on healthcare resources.  As this pandemic evolves, our facility, providers, and staff strive to respond fluidly, to remain operational, and to provide care relative to available resources and information. Outcomes are unpredictable and treatments are without well-defined guidelines. Further, the impact of COVID-19 on all aspects of urgent care, including the impact to patients  seeking care for reasons other than COVID-19, is unavoidable during this national emergency. At this time of the global pandemic, management of patients has significantly  changed, even for non-COVID positive patients given high local and regional COVID volumes at this time requiring high healthcare system and resource utilization. The standard of care for management of both COVID suspected and non-COVID suspected patients continues to change rapidly at the local, regional, national, and global levels. This patient was worked up and treated to the best available but ever changing evidence and resources available at this current time.   Documentation was completed with the aid of voice recognition software. Transcription may contain typographical errors. Final Clinical Impressions(s) / UC Diagnoses   Final diagnoses:  Cough  Acute bronchitis, unspecified organism   Discharge Instructions   None    ED Prescriptions     Medication Sig Dispense Auth. Provider   cefdinir (OMNICEF) 300 MG capsule Take 1 capsule (300 mg total) by mouth 2 (two) times daily. 14 capsule Shalla Bulluck, GilletteSamantha, FNP   predniSONE (STERAPRED UNI-PAK 21 TAB) 10 MG (21) TBPK tablet Take as directed 21 tablet Lurline IdolMurrill, Sharonda Llamas, FNP   albuterol (VENTOLIN HFA) 108 (90 Base) MCG/ACT inhaler Inhale 1-2 puffs into the lungs every 6 (six) hours as needed for wheezing or shortness of breath. 1 each Lurline IdolMurrill, Tawna Alwin, FNP   promethazine-dextromethorphan (PROMETHAZINE-DM) 6.25-15 MG/5ML syrup Take 5 mLs by mouth 4 (four) times daily as needed for cough. 118 mL Lurline IdolMurrill, Milbert Bixler, FNP      PDMP not reviewed this encounter.   Lurline IdolMurrill, Delaine Canter, [place]FNP 03/09/21 2032

## 2021-03-09 NOTE — ED Triage Notes (Signed)
Greater than one week h/o congestion, cough and intermittent sore throat. Denies ear and abdominal pain. No n/v/d. Has been taking Mucinex with some temporary relief.

## 2021-03-09 NOTE — ED Triage Notes (Signed)
Pt c/o flu like sx x 1 week-NAD-steady gait 

## 2021-05-25 ENCOUNTER — Emergency Department (HOSPITAL_COMMUNITY): Payer: No Typology Code available for payment source

## 2021-05-25 ENCOUNTER — Other Ambulatory Visit: Payer: Self-pay

## 2021-05-25 ENCOUNTER — Other Ambulatory Visit (HOSPITAL_COMMUNITY): Payer: Self-pay

## 2021-05-25 ENCOUNTER — Ambulatory Visit
Admission: EM | Admit: 2021-05-25 | Discharge: 2021-05-25 | Disposition: A | Discharge status: Other Acute Inpatient Hospital | Payer: No Typology Code available for payment source

## 2021-05-25 ENCOUNTER — Encounter (HOSPITAL_COMMUNITY): Payer: Self-pay | Admitting: Oncology

## 2021-05-25 ENCOUNTER — Emergency Department (HOSPITAL_COMMUNITY)
Admission: EM | Admit: 2021-05-25 | Discharge: 2021-05-25 | Disposition: A | Discharge status: Home / Self Care | Payer: No Typology Code available for payment source | Attending: Emergency Medicine | Admitting: Emergency Medicine

## 2021-05-25 DIAGNOSIS — Z79899 Other long term (current) drug therapy: Secondary | ICD-10-CM | POA: Diagnosis not present

## 2021-05-25 DIAGNOSIS — Z7901 Long term (current) use of anticoagulants: Secondary | ICD-10-CM | POA: Insufficient documentation

## 2021-05-25 DIAGNOSIS — I10 Essential (primary) hypertension: Secondary | ICD-10-CM | POA: Diagnosis not present

## 2021-05-25 DIAGNOSIS — Z20822 Contact with and (suspected) exposure to covid-19: Secondary | ICD-10-CM | POA: Insufficient documentation

## 2021-05-25 DIAGNOSIS — Z87891 Personal history of nicotine dependence: Secondary | ICD-10-CM | POA: Insufficient documentation

## 2021-05-25 DIAGNOSIS — R0602 Shortness of breath: Secondary | ICD-10-CM

## 2021-05-25 DIAGNOSIS — R062 Wheezing: Secondary | ICD-10-CM | POA: Insufficient documentation

## 2021-05-25 DIAGNOSIS — J40 Bronchitis, not specified as acute or chronic: Secondary | ICD-10-CM

## 2021-05-25 LAB — RESP PANEL BY RT-PCR (FLU A&B, COVID) ARPGX2
Influenza A by PCR: NEGATIVE
Influenza B by PCR: NEGATIVE
SARS Coronavirus 2 by RT PCR: NEGATIVE

## 2021-05-25 MED ORDER — PREDNISONE 50 MG PO TABS
ORAL_TABLET | ORAL | 0 refills | Status: DC
  Filled 2021-05-25: qty 5, fill #0

## 2021-05-25 MED ORDER — ALBUTEROL SULFATE HFA 108 (90 BASE) MCG/ACT IN AERS: 2.0000 | INHALATION_SPRAY | RESPIRATORY_TRACT | Status: DC | PRN

## 2021-05-25 MED ORDER — IPRATROPIUM BROMIDE 0.02 % IN SOLN
0.5000 mg | Freq: Once | RESPIRATORY_TRACT | Status: AC
  Administered 2021-05-25: 0.5 mg via RESPIRATORY_TRACT
  Filled 2021-05-25: qty 2.5

## 2021-05-25 MED ORDER — ALBUTEROL SULFATE (2.5 MG/3ML) 0.083% IN NEBU
INHALATION_SOLUTION | RESPIRATORY_TRACT | Status: AC
  Administered 2021-05-25: 10 mg
  Filled 2021-05-25: qty 12

## 2021-05-25 MED ORDER — ALBUTEROL SULFATE HFA 108 (90 BASE) MCG/ACT IN AERS
1.0000 | INHALATION_SPRAY | Freq: Four times a day (QID) | RESPIRATORY_TRACT | 0 refills | Status: DC | PRN
  Filled 2021-05-25: qty 1, fill #0

## 2021-05-25 MED ORDER — ALBUTEROL (5 MG/ML) CONTINUOUS INHALATION SOLN: 10.0000 mg/h | INHALATION_SOLUTION | Freq: Once | RESPIRATORY_TRACT | Status: DC

## 2021-05-25 NOTE — ED Provider Notes (Signed)
[facility]    CSN: [id number] Arrival date & time: 05/25/21  0830      History   Chief Complaint Chief Complaint  Patient presents with   Shortness of Breath    HPI Andrew [name] is a 40 y.o. male.   Patient presents with 3-day history of shortness of breath and wheezing.  Also having some nasal congestion that started around the same time.  Denies any known fevers, chest pain, any known sick contacts.  Denies any COPD or asthma but does report history of bronchitis.  Although, patient reports that he was admitted to the hospital in March of this year and required intubation with tracheostomy.  Also had DVT in femoral vein during that hospitalization.  Patient denies history of acute renal failure and dialysis treatment, but recent note for follow-up mentioned acute renal failure requiring hemodialysis during the hospitalization.   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 No pertinent surgical history.     Home Medications    Prior to Admission medications   Medication Sig Start Date End Date Taking? Authorizing Provider  albuterol (VENTOLIN HFA) 108 (90 Base) MCG/ACT inhaler Inhale 1-2 puffs into the lungs every 6 (six) hours as needed for wheezing or shortness of breath. 03/09/21   Lurline Idol, FNP  amLODipine (NORVASC) 10  MG tablet Take 1 tablet (10 mg total) by mouth daily. 11/24/20   Russella Dar, NP  amLODipine (NORVASC) 10 MG tablet TAKE 1 TABLET (10 MG TOTAL) BY MOUTH DAILY. 11/23/20 11/23/21  Russella Dar, NP  apixaban (ELIQUIS) 5 MG TABS tablet Take 1 tablet (5 mg total) by mouth 2 (two) times daily. 11/23/20   Russella Dar, NP  apixaban (ELIQUIS) 5 MG TABS tablet TAKE 1 TABLET (5 MG TOTAL) BY MOUTH TWO TIMES DAILY. 11/23/20 11/23/21  Russella Dar, NP  carvedilol (COREG) 25 MG tablet Take 1 tablet (25 mg total) by mouth 2 (two) times daily with a meal. 11/23/20   Russella Dar, NP  carvedilol (COREG) 25 MG tablet TAKE 1 TABLET (25 MG TOTAL) BY MOUTH TWO TIMES DAILY WITH A MEAL. 11/23/20 11/23/21  Russella Dar, NP  cefdinir (OMNICEF) 300 MG capsule Take 1 capsule (300 mg total) by mouth 2 (two) times daily. 03/09/21   Lurline Idol, FNP  cloNIDine (CATAPRES) 0.1 MG tablet Take 1 tablet (0.1 mg total) by mouth 2 (two) times daily. 11/23/20   Russella Dar, NP  cloNIDine (CATAPRES) 0.1 MG tablet TAKE 1 TABLET (0.1 MG TOTAL) BY MOUTH TWO TIMES DAILY. 11/23/20 11/23/21  Russella Dar, NP  Multiple Vitamin (MULTIVITAMIN WITH MINERALS) TABS tablet Take 1 tablet by mouth daily. 11/24/20   Russella Dar, NP  prazosin (MINIPRESS) 1 MG capsule TAKE 4 CAPSULES (4 MG TOTAL) BY  MOUTH AT BEDTIME. 11/23/20 11/23/21  Russella Dar, NP  prazosin (MINIPRESS) 2 MG capsule Take 2 capsules (4 mg total) by mouth at bedtime. 11/23/20   Russella Dar, NP  predniSONE (STERAPRED UNI-PAK 21 TAB) 10 MG (21) TBPK tablet Take as directed 03/09/21   Lurline Idol, FNP  promethazine-dextromethorphan (PROMETHAZINE-DM) 6.25-15 MG/5ML syrup Take 5 mLs by mouth 4 (four) times daily as needed for cough. 03/09/21   Lurline Idol, FNP  senna (SENOKOT) 8.6 MG TABS tablet Take 1 tablet (8.6 mg total) by mouth 2 (two) times daily. 11/23/20   Russella Dar, NP  senna (SENOKOT) 8.6 MG TABS tablet TAKE 1 TABLET (8.6 MG  TOTAL) BY MOUTH TWO TIMES DAILY. 11/23/20 11/23/21  Russella Dar, NP  sertraline (ZOLOFT) 50 MG tablet Take 1 tablet (50 mg total) by mouth at bedtime. 11/23/20   Russella Dar, NP  sertraline (ZOLOFT) 50 MG tablet TAKE 1 TABLET (50 MG TOTAL) BY MOUTH AT BEDTIME. 11/23/20 11/23/21  Russella Dar, NP  traZODone (DESYREL) 50 MG tablet Take 1 tablet (50 mg total) by mouth at bedtime. 11/23/20   Russella Dar, NP  traZODone (DESYREL) 50 MG tablet TAKE 1 TABLET (50 MG TOTAL) BY MOUTH AT BEDTIME. 11/23/20 11/23/21  Russella Dar, NP  vitamin B-12 (CYANOCOBALAMIN) 1000 MCG tablet TAKE 1 TABLET (1,000 MCG TOTAL) BY MOUTH DAILY. 11/23/20 11/23/21  Russella Dar, NP  vitamin B-12 1000 MCG tablet Take 1 tablet (1,000 mcg total) by mouth daily. 11/24/20   Russella Dar, NP    Family History History reviewed. No pertinent family history.  Social History Social History   Tobacco Use   Smoking status: Former    Types: Cigarettes   Smokeless tobacco: Never  Vaping Use   Vaping Use: Some days  Substance Use Topics   Alcohol use: Never   Drug use: Never     Allergies   Patient has no known allergies.   Review of Systems Review of Systems Per HPI  Physical Exam Triage Vital Signs ED Triage Vitals  Enc Vitals Group     BP 05/25/21 0838 118/80     Pulse Rate 05/25/21 0838 95     Resp 05/25/21 0838 14     Temp 05/25/21 0838 97.8 F (36.6 C)     Temp Source 05/25/21 0838 Oral     SpO2 05/25/21 0838 92 %     Weight --      Height --      Head Circumference --      Peak Flow --      Pain Score 05/25/21 0844 0     Pain Loc --      Pain Edu? --      Excl. in GC? --    No data found.  Updated Vital Signs BP 118/80 (BP Location: Left Arm)   Pulse 95   Temp 97.8 F (36.6 C) (Oral)   Resp 14   SpO2 92%   Visual Acuity Right Eye Distance:   Left Eye Distance:   Bilateral Distance:    Right Eye Near:   Left Eye Near:    Bilateral Near:     Physical  Exam Constitutional:      General: He is in acute distress.     Appearance: He is ill-appearing. He is not toxic-appearing or diaphoretic.  HENT:     Head: Normocephalic.     Right Ear: Tympanic membrane and ear canal normal.  Left Ear: Tympanic membrane and ear canal normal.     Nose: Congestion present.     Mouth/Throat:     Mouth: Mucous membranes are moist.     Pharynx: No posterior oropharyngeal erythema.  Eyes:     Extraocular Movements: Extraocular movements intact.     Conjunctiva/sclera: Conjunctivae normal.     Pupils: Pupils are equal, round, and reactive to light.  Cardiovascular:     Rate and Rhythm: Normal rate and regular rhythm.     Pulses: Normal pulses.     Heart sounds: Normal heart sounds.  Pulmonary:     Effort: Respiratory distress present.     Breath sounds: No stridor. Wheezing present. No rhonchi.  Musculoskeletal:        General: Normal range of motion.  Skin:    General: Skin is warm and dry.  Neurological:     General: No focal deficit present.     Mental Status: He is alert and oriented to person, place, and time.     UC Treatments / Results  Labs (all labs ordered are listed, but only abnormal results are displayed) Labs Reviewed - No data to display  EKG   Radiology No results found.  Procedures Procedures (including critical care time)  Medications Ordered in UC Medications - No data to display  Initial Impression / Assessment and Plan / UC Course  I have reviewed the triage vital signs and the nursing notes.  Pertinent labs & imaging results that were available during my care of the patient were reviewed by me and considered in my medical decision making (see chart for details).     EMS was called and transported patient to the hospital due to shortness of breath and appearance of respiratory distress during physical exam.  Patient also has low oxygen saturation ranging in the low 90s.  Unable to administer albuterol  nebulizer treatment in urgent care due to policy.  Patient was agreeable with plan and left via EMS. Final Clinical Impressions(s) / UC Diagnoses   Final diagnoses:  Shortness of breath  Wheezing     Discharge Instructions      Patient was sent to the hospital via EMS.     ED Prescriptions   None    PDMP not reviewed this encounter.   Lance Muss, FNP 05/25/21 [phone]

## 2021-05-25 NOTE — ED Triage Notes (Signed)
Pt c/o wheezing, sob, mostly at night but it is audible without stethoscope in office today. Onset about 5 days ago. States he had bronchitis a few months ago but he does not think has fully resolved.

## 2021-05-25 NOTE — ED Provider Notes (Signed)
[facility]-EMERGENCY DEPT Provider Note   CSN: [id number] Arrival date & time: 05/25/21  1005     History Chief Complaint  Patient presents with   Respiratory Distress    Andrew [name] is a 40 y.o. male.  40 year old male presents with shortness of breath x 24 hours.  Patient states that he has been coughing and has not had any fever or chills.  Went to urgent care just prior to arrival and found to be in respiratory distress.  EMS was called and patient given albuterol, Solu-Medrol and patient feels much better at this time.  Denies any chest pain or chest pressure.  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 No pertinent surgical history.     No family history on file.  Social History   Tobacco Use   Smoking status: Former    Types: Cigarettes   Smokeless tobacco: Never  Vaping Use   Vaping Use: Some days  Substance Use Topics   Alcohol use: Never   Drug use: Never    Home Medications Prior to Admission medications   Medication Sig Start Date End Date Taking? Authorizing Provider  albuterol (VENTOLIN HFA) 108 (90 Base) MCG/ACT inhaler Inhale 1-2 puffs into the lungs every 6 (six) hours as needed for wheezing or shortness of breath. 03/09/21    Lurline Idol, FNP  amLODipine (NORVASC) 10 MG tablet Take 1 tablet (10 mg total) by mouth daily. 11/24/20   Russella Dar, NP  amLODipine (NORVASC) 10 MG tablet TAKE 1 TABLET (10 MG TOTAL) BY MOUTH DAILY. 11/23/20 11/23/21  Russella Dar, NP  apixaban (ELIQUIS) 5 MG TABS tablet Take 1 tablet (5 mg total) by mouth 2 (two) times daily. 11/23/20   Russella Dar, NP  apixaban (ELIQUIS) 5 MG TABS tablet TAKE 1 TABLET (5 MG TOTAL) BY MOUTH TWO TIMES DAILY. 11/23/20 11/23/21  Russella Dar, NP  carvedilol (COREG) 25 MG tablet Take 1 tablet (25 mg total) by mouth 2 (two) times daily with a meal. 11/23/20   Russella Dar, NP  carvedilol (COREG) 25 MG tablet TAKE 1 TABLET (25 MG TOTAL) BY MOUTH TWO TIMES DAILY WITH A MEAL. 11/23/20 11/23/21  Russella Dar, NP  cefdinir (OMNICEF) 300 MG capsule Take 1 capsule (300 mg total) by mouth 2 (two) times daily. 03/09/21   Lurline Idol, FNP  cloNIDine (CATAPRES) 0.1 MG tablet Take 1 tablet (0.1 mg total) by mouth 2 (two) times daily. 11/23/20   Russella Dar, NP  cloNIDine (CATAPRES) 0.1 MG tablet TAKE 1 TABLET (0.1 MG TOTAL) BY MOUTH TWO TIMES DAILY. 11/23/20 11/23/21  Russella Dar, NP  Multiple Vitamin (MULTIVITAMIN WITH MINERALS) TABS tablet Take 1 tablet by  mouth daily. 11/24/20   Russella Dar, NP  prazosin (MINIPRESS) 1 MG capsule TAKE 4 CAPSULES (4 MG TOTAL) BY MOUTH AT BEDTIME. 11/23/20 11/23/21  Russella Dar, NP  prazosin (MINIPRESS) 2 MG capsule Take 2 capsules (4 mg total) by mouth at bedtime. 11/23/20   Russella Dar, NP  predniSONE (STERAPRED UNI-PAK 21 TAB) 10 MG (21) TBPK tablet Take as directed 03/09/21   Lurline Idol, FNP  promethazine-dextromethorphan (PROMETHAZINE-DM) 6.25-15 MG/5ML syrup Take 5 mLs by mouth 4 (four) times daily as needed for cough. 03/09/21   Lurline Idol, FNP  senna (SENOKOT) 8.6 MG TABS tablet Take 1 tablet (8.6 mg total) by mouth 2 (two) times daily. 11/23/20   Russella Dar, NP  senna  (SENOKOT) 8.6 MG TABS tablet TAKE 1 TABLET (8.6 MG TOTAL) BY MOUTH TWO TIMES DAILY. 11/23/20 11/23/21  Russella Dar, NP  sertraline (ZOLOFT) 50 MG tablet Take 1 tablet (50 mg total) by mouth at bedtime. 11/23/20   Russella Dar, NP  sertraline (ZOLOFT) 50 MG tablet TAKE 1 TABLET (50 MG TOTAL) BY MOUTH AT BEDTIME. 11/23/20 11/23/21  Russella Dar, NP  traZODone (DESYREL) 50 MG tablet Take 1 tablet (50 mg total) by mouth at bedtime. 11/23/20   Russella Dar, NP  traZODone (DESYREL) 50 MG tablet TAKE 1 TABLET (50 MG TOTAL) BY MOUTH AT BEDTIME. 11/23/20 11/23/21  Russella Dar, NP  vitamin B-12 (CYANOCOBALAMIN) 1000 MCG tablet TAKE 1 TABLET (1,000 MCG TOTAL) BY MOUTH DAILY. 11/23/20 11/23/21  Russella Dar, NP  vitamin B-12 1000 MCG tablet Take 1 tablet (1,000 mcg total) by mouth daily. 11/24/20   Russella Dar, NP    Allergies    Patient has no known allergies.  Review of Systems   Review of Systems  All other systems reviewed and are negative.  Physical Exam Updated Vital Signs BP 119/78 (BP Location: Left Arm)   Pulse 90   Temp 97.6 F (36.4 C) (Oral)   Resp 16   Ht 1.727 m (5\' 8" )   Wt 94.3 kg   SpO2 100%   BMI 31.63 kg/m   Physical Exam Vitals and nursing note reviewed.  Constitutional:      General: He is not in acute distress.    Appearance: Normal appearance. He is well-developed. He is not toxic-appearing.  HENT:     Head: Normocephalic and atraumatic.  Eyes:     General: Lids are normal.     Conjunctiva/sclera: Conjunctivae normal.     Pupils: Pupils are equal, round, and reactive to light.  Neck:     Thyroid: No thyroid mass.     Trachea: No tracheal deviation.  Cardiovascular:     Rate and Rhythm: Normal rate and regular rhythm.     Heart sounds: Normal heart sounds. No murmur heard.   No gallop.  Pulmonary:     Effort: Pulmonary effort is normal. No respiratory distress.     Breath sounds: No stridor. Examination of the right-upper field reveals  decreased breath sounds and wheezing. Examination of the left-upper field reveals decreased breath sounds and wheezing. Decreased breath sounds and wheezing present. No rhonchi or rales.  Abdominal:     General: There is no distension.     Palpations: Abdomen is soft.     Tenderness: There is no abdominal tenderness. There is no rebound.  Musculoskeletal:        General: No tenderness. Normal range of motion.     Cervical  back: Normal range of motion and neck supple.  Skin:    General: Skin is warm and dry.     Findings: No abrasion or rash.  Neurological:     Mental Status: He is alert and oriented to person, place, and time. Mental status is at baseline.     GCS: GCS eye subscore is 4. GCS verbal subscore is 5. GCS motor subscore is 6.     Cranial Nerves: Cranial nerves are intact. No cranial nerve deficit.     Sensory: No sensory deficit.     Motor: Motor function is intact.  Psychiatric:        Attention and Perception: Attention normal.        Speech: Speech normal.        Behavior: Behavior normal.    ED Results / Procedures / Treatments   Labs (all labs ordered are listed, but only abnormal results are displayed) Labs Reviewed  RESP PANEL BY RT-PCR (FLU A&B, COVID) [id number]    EKG EKG Interpretation  Date/Time:  Thursday May 25 2021 10:13:25 EDT Ventricular Rate:  91 PR Interval:  134 QRS Duration: 94 QT Interval:  383 QTC Calculation: 472 R Axis:   87 Text Interpretation: Sinus rhythm Confirmed by Lorre Nick ([id number]) on 05/25/2021 12:54:08 PM  Radiology No results found.  Procedures Procedures   Medications Ordered in ED Medications  albuterol (VENTOLIN HFA) 108 (90 Base) MCG/ACT inhaler 2 puff (has no administration in time range)  albuterol (PROVENTIL,VENTOLIN) solution continuous neb (has no administration in time range)  ipratropium (ATROVENT) nebulizer solution 0.5 mg (has no administration in time range)    ED Course  I have reviewed the  triage vital signs and the nursing notes.  Pertinent labs & imaging results that were available during my care of the patient were reviewed by me and considered in my medical decision making (see chart for details).    MDM Rules/Calculators/A&P                           Patient treated with albuterol here and feels better.  COVID test and chest x-ray are both negative.  Will place on prednisone taper and discharge Final Clinical Impression(s) / ED Diagnoses Final diagnoses:  None    Rx / DC Orders ED Discharge Orders     None        Lorre Nick, MD 05/25/21 1254

## 2021-05-25 NOTE — Discharge Instructions (Signed)
Patient was sent to the hospital via EMS.  

## 2021-05-25 NOTE — ED Notes (Addendum)
Pt ambulatory without assistance. Pt denies any exertional shortness of breath.

## 2021-05-25 NOTE — ED Triage Notes (Signed)
Pt bib [facility] from UC d/t respiratory distress.  Pt w/ hx of bronchitis as well as recent hospitalization in the last month at [facility] where pt had to be intubated. Per EMS pt had very limited air movement b/l.  Given 125 mg solumedrol, 1 albuterol neb and 2 duo nebs en route. Audible expiratory wheezing noted.

## 2022-02-27 ENCOUNTER — Other Ambulatory Visit: Payer: Self-pay

## 2022-02-27 ENCOUNTER — Ambulatory Visit
Admission: EM | Admit: 2022-02-27 | Discharge: 2022-02-27 | Disposition: A | Discharge status: Home / Self Care | Payer: No Typology Code available for payment source | Attending: Student | Admitting: Student

## 2022-02-27 ENCOUNTER — Encounter: Payer: Self-pay | Admitting: Emergency Medicine

## 2022-02-27 DIAGNOSIS — Z86718 Personal history of other venous thrombosis and embolism: Secondary | ICD-10-CM | POA: Diagnosis not present

## 2022-02-27 DIAGNOSIS — J4521 Mild intermittent asthma with (acute) exacerbation: Secondary | ICD-10-CM

## 2022-02-27 MED ORDER — ALBUTEROL SULFATE HFA 108 (90 BASE) MCG/ACT IN AERS
2.0000 | INHALATION_SPRAY | Freq: Once | RESPIRATORY_TRACT | Status: AC
  Administered 2022-02-27: 2 via RESPIRATORY_TRACT

## 2022-02-27 NOTE — Discharge Instructions (Addendum)
-  Albuterol inhaler as needed for cough, wheezing, shortness of breath, 1 to 2 puffs every 6 hours as needed. -New symptoms like chest pain, worsening shortness of breath, shortness of breath not relieved by inhaler, leg swelling- seek additional immediate care

## 2022-02-27 NOTE — ED Provider Notes (Signed)
[facility]    CSN: [id number] Arrival date & time: 02/27/22  1349      History   Chief Complaint Chief Complaint  Patient presents with   Asthma    HPI Andrew [name] is a 41 y.o. male presenting with dyspnea following running out of his albuterol inhaler. History respiratory failure, AKI, DVT. 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 States dyspnea on exertion that is improved following albuterol but returns 2 hours later. Not associated with fevers, chest pain, leg swelling, unilateral leg swelling. Denies recent travel, prolonged immobilization, recent surgery, recent trauma, HRT use, history of PE, smoking.   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 No pertinent surgical history.     Home Medications    Prior to Admission medications   Medication Sig Start Date End Date Taking? Authorizing Provider  albuterol (VENTOLIN HFA) 108 (90 Base) MCG/ACT inhaler Inhale 1-2 puffs into the lungs every 6 (six) hours as needed for wheezing or shortness of breath. 03/09/21   Lurline Idol, FNP  albuterol (VENTOLIN HFA) 108 (90 Base) MCG/ACT inhaler Inhale 1-2 puffs into the lungs every 6 (six) hours as needed for wheezing or shortness of breath. 05/25/21   Lorre Nick, MD  amLODipine (NORVASC) 10 MG tablet Take 1 tablet (10 mg total)  by mouth daily. 11/24/20   Russella Dar, NP  amLODipine (NORVASC) 10 MG tablet TAKE 1 TABLET (10 MG TOTAL) BY MOUTH DAILY. 11/23/20 11/23/21  Russella Dar, NP  apixaban (ELIQUIS) 5 MG TABS tablet Take 1 tablet (5 mg total) by mouth 2 (two) times daily. 11/23/20   Russella Dar, NP  apixaban (ELIQUIS) 5 MG TABS tablet TAKE 1 TABLET (5 MG TOTAL) BY MOUTH TWO TIMES DAILY. 11/23/20 11/23/21  Russella Dar, NP  carvedilol (COREG) 25 MG tablet Take 1 tablet (25 mg total) by mouth 2 (two) times daily with a meal. 11/23/20   Russella Dar, NP  carvedilol (COREG) 25 MG tablet TAKE 1 TABLET (25 MG TOTAL) BY MOUTH TWO TIMES DAILY WITH A MEAL. 11/23/20 11/23/21  Russella Dar, NP  cefdinir (OMNICEF) 300 MG capsule Take 1 capsule (300 mg total) by mouth 2 (two) times daily. 03/09/21   Lurline Idol, FNP  cloNIDine (CATAPRES) 0.1 MG tablet Take 1 tablet (0.1 mg total) by mouth 2 (two) times daily. 11/23/20   Russella Dar, NP  cloNIDine (CATAPRES) 0.1 MG tablet TAKE 1 TABLET (0.1 MG TOTAL) BY MOUTH TWO TIMES DAILY. 11/23/20 11/23/21  Russella Dar, NP  Multiple Vitamin (MULTIVITAMIN WITH MINERALS) TABS tablet Take 1 tablet by mouth daily. 11/24/20   Russella Dar, NP  prazosin (MINIPRESS) 1 MG capsule TAKE 4 CAPSULES (4 MG TOTAL) BY MOUTH AT BEDTIME. 11/23/20 11/23/21  Russella Dar, NP  prazosin (  MINIPRESS) 2 MG capsule Take 2 capsules (4 mg total) by mouth at bedtime. 11/23/20   Russella Dar, NP  predniSONE (DELTASONE) 50 MG tablet 1 p.o. daily x5 05/25/21   Lorre Nick, MD  predniSONE (STERAPRED UNI-PAK 21 TAB) 10 MG (21) TBPK tablet Take as directed 03/09/21   Lurline Idol, FNP  promethazine-dextromethorphan (PROMETHAZINE-DM) 6.25-15 MG/5ML syrup Take 5 mLs by mouth 4 (four) times daily as needed for cough. 03/09/21   Lurline Idol, FNP  senna (SENOKOT) 8.6 MG TABS tablet Take 1 tablet (8.6 mg total) by mouth 2 (two) times daily. 11/23/20   Russella Dar, NP  sertraline  (ZOLOFT) 50 MG tablet Take 1 tablet (50 mg total) by mouth at bedtime. 11/23/20   Russella Dar, NP  sertraline (ZOLOFT) 50 MG tablet TAKE 1 TABLET (50 MG TOTAL) BY MOUTH AT BEDTIME. 11/23/20 11/23/21  Russella Dar, NP  traZODone (DESYREL) 50 MG tablet Take 1 tablet (50 mg total) by mouth at bedtime. 11/23/20   Russella Dar, NP  traZODone (DESYREL) 50 MG tablet TAKE 1 TABLET (50 MG TOTAL) BY MOUTH AT BEDTIME. 11/23/20 11/23/21  Russella Dar, NP  vitamin B-12 1000 MCG tablet Take 1 tablet (1,000 mcg total) by mouth daily. 11/24/20   Russella Dar, NP    Family History History reviewed. No pertinent family history.  Social History Social History   Tobacco Use   Smoking status: Former    Types: Cigarettes   Smokeless tobacco: Never  Vaping Use   Vaping Use: Some days  Substance Use Topics   Alcohol use: Never   Drug use: Never     Allergies   Patient has no known allergies.   Review of Systems Review of Systems  Constitutional:  Negative for appetite change, chills and fever.  HENT:  Negative for congestion, ear pain, rhinorrhea, sinus pressure, sinus pain and sore throat.   Eyes:  Negative for redness and visual disturbance.  Respiratory:  Positive for shortness of breath. Negative for cough, chest tightness and wheezing.   Cardiovascular:  Negative for chest pain and palpitations.  Gastrointestinal:  Negative for abdominal pain, constipation, diarrhea, nausea and vomiting.  Genitourinary:  Negative for dysuria, frequency and urgency.  Musculoskeletal:  Negative for myalgias.  Neurological:  Negative for dizziness, weakness and headaches.  Psychiatric/Behavioral:  Negative for confusion.   All other systems reviewed and are negative.    Physical Exam Triage Vital Signs ED Triage Vitals [02/27/22 1403]  Enc Vitals Group     BP 111/72     Pulse Rate 99     Resp 18     Temp 97.9 F (36.6 C)     Temp Source Oral     SpO2 96 %     Weight      Height       Head Circumference      Peak Flow      Pain Score 0     Pain Loc      Pain Edu?      Excl. in GC?    No data found.  Updated Vital Signs BP 111/72 (BP Location: Left Arm)   Pulse 99   Temp 97.9 F (36.6 C) (Oral)   Resp 18   SpO2 96%   Visual Acuity Right Eye Distance:   Left Eye Distance:   Bilateral Distance:    Right Eye Near:   Left Eye Near:    Bilateral Near:     Physical  Exam Vitals reviewed.  Constitutional:      General: He is not in acute distress.    Appearance: Normal appearance. He is not ill-appearing or diaphoretic.  HENT:     Head: Normocephalic and atraumatic.  Cardiovascular:     Rate and Rhythm: Normal rate and regular rhythm.     Heart sounds: Normal heart sounds.  Pulmonary:     Effort: Pulmonary effort is normal.     Breath sounds: Decreased breath sounds present. No wheezing, rhonchi or rales.     Comments: decreased breath sounds throughout improved following albuterol inhaler.  Musculoskeletal:     Right lower leg: No edema.     Left lower leg: No edema.  Skin:    General: Skin is warm.  Neurological:     General: No focal deficit present.     Mental Status: He is alert and oriented to person, place, and time.  Psychiatric:        Mood and Affect: Mood normal.        Behavior: Behavior normal.        Thought Content: Thought content normal.        Judgment: Judgment normal.      UC Treatments / Results  Labs (all labs ordered are listed, but only abnormal results are displayed) Labs Reviewed - No data to display  EKG   Radiology No results found.  Procedures Procedures (including critical care time)  Medications Ordered in UC Medications  albuterol (VENTOLIN HFA) 108 (90 Base) MCG/ACT inhaler 2 puff (has no administration in time range)    Initial Impression / Assessment and Plan / UC Course  I have reviewed the triage vital signs and the nursing notes.  Pertinent labs & imaging results that were available during  my care of the patient were reviewed by me and considered in my medical decision making (see chart for details).     This patient is a very pleasant 41 y.o. year old male presenting with asthma flare following running out of albuterol inhaler. Afebrile, nontachy. Initially with decreased respirations, improved following albuterol inhaler provided in clinic. He does have a history of one unprovoked DVT, no longer on anticoagulation, but there is no pedal edema or leg swelling today, and he is not having any chest pain. Dyspnea improved following albuterol inhaler. PERC score 1 given history DVT. Discussed differential is likely reactive vs (much less likely) DVT/PE. Will proceed with outpatient albuterol. ED return precautions discussed. Patient verbalizes understanding and agreement.   Final Clinical Impressions(s) / UC Diagnoses   Final diagnoses:  Mild intermittent reactive airway disease with acute exacerbation  History of DVT in adulthood     Discharge Instructions      -Albuterol inhaler as needed for cough, wheezing, shortness of breath, 1 to 2 puffs every 6 hours as needed. -New symptoms like chest pain, worsening shortness of breath, shortness of breath not relieved by inhaler, leg swelling- seek additional immediate care   ED Prescriptions   None    PDMP not reviewed this encounter.   Rhys Martini, PA-C 02/27/22 1514

## 2022-02-27 NOTE — ED Triage Notes (Signed)
Pt here for albuterol inhaler; pt sts his is almost out and new RX coming in mail

## 2022-07-25 ENCOUNTER — Ambulatory Visit
Admission: EM | Admit: 2022-07-25 | Discharge: 2022-07-25 | Disposition: A | Discharge status: Home / Self Care | Payer: Medicaid Other | Attending: Physician Assistant | Admitting: Physician Assistant

## 2022-07-25 ENCOUNTER — Ambulatory Visit (INDEPENDENT_AMBULATORY_CARE_PROVIDER_SITE_OTHER): Payer: Medicaid Other

## 2022-07-25 DIAGNOSIS — R053 Chronic cough: Secondary | ICD-10-CM | POA: Diagnosis not present

## 2022-07-25 DIAGNOSIS — J45901 Unspecified asthma with (acute) exacerbation: Secondary | ICD-10-CM | POA: Diagnosis not present

## 2022-07-25 MED ORDER — PREDNISONE 20 MG PO TABS: 40.0000 mg | ORAL_TABLET | Freq: Every day | ORAL | 0 refills | Status: AC

## 2022-07-25 MED ORDER — ALBUTEROL SULFATE HFA 108 (90 BASE) MCG/ACT IN AERS: 1.0000 | INHALATION_SPRAY | Freq: Four times a day (QID) | RESPIRATORY_TRACT | 0 refills | Status: AC | PRN

## 2022-07-25 MED ORDER — AMOXICILLIN-POT CLAVULANATE 875-125 MG PO TABS: 1.0000 | ORAL_TABLET | Freq: Two times a day (BID) | ORAL | 0 refills | Status: AC

## 2022-07-25 NOTE — ED Triage Notes (Signed)
Pt c/o cough, congestion in chest and nose, nasal drainage, body aches,   Onset ~ "a couple of weeks"

## 2022-07-25 NOTE — ED Provider Notes (Signed)
[facility]    CSN: [id number] Arrival date & time: 07/25/22  1142      History   Chief Complaint Chief Complaint  Patient presents with   Cough    HPI Andrew [name] is a 41 y.o. male.   Patient here today for evaluation of cough and congestion has had for the last several weeks.  He reports that cough is productive.  He does have some wheezing as well and states that he has been using his albuterol inhaler with mild relief.  He denies any known fever.  He has had some sore throat.  The history is provided by the patient.  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 No pertinent surgical history.     Home Medications    Prior to Admission medications   Medication Sig Start Date End Date Taking? Authorizing Provider  amoxicillin-clavulanate (AUGMENTIN) 875-125 MG tablet Take 1 tablet by mouth every 12 (twelve) hours. 07/25/22  Yes Tomi Bamberger, PA-C  predniSONE (DELTASONE) 20 MG tablet Take 2 tablets (40 mg total) by mouth daily with breakfast for 5 days. 07/25/22 07/30/22 Yes Tomi Bamberger, PA-C  albuterol (VENTOLIN HFA) 108 (90 Base) MCG/ACT inhaler Inhale 1-2 puffs into the lungs every 6  (six) hours as needed for wheezing or shortness of breath. 07/25/22   Tomi Bamberger, PA-C  cloNIDine (CATAPRES) 0.1 MG tablet Take 1 tablet (0.1 mg total) by mouth 2 (two) times daily. 11/23/20   Russella Dar, NP  cloNIDine (CATAPRES) 0.1 MG tablet TAKE 1 TABLET (0.1 MG TOTAL) BY MOUTH TWO TIMES DAILY. 11/23/20 11/23/21  Russella Dar, NP  Multiple Vitamin (MULTIVITAMIN WITH MINERALS) TABS tablet Take 1 tablet by mouth daily. 11/24/20   Russella Dar, NP  prazosin (MINIPRESS) 2 MG capsule Take 2 capsules (4 mg total) by mouth at bedtime. 11/23/20   Russella Dar, NP  traZODone (DESYREL) 50 MG tablet Take 1 tablet (50 mg total) by mouth at bedtime. 11/23/20   Russella Dar, NP  vitamin B-12 1000 MCG tablet Take 1 tablet (1,000 mcg total) by mouth daily. 11/24/20   Russella Dar, NP    Family History History reviewed. No pertinent family history.  Social History Social History   Tobacco Use   Smoking status: Former    Types: Cigarettes   Smokeless tobacco: Never  Vaping Use   Vaping Use: Some days  Substance Use Topics   Alcohol use: Never   Drug use: Never     Allergies   Patient has no known allergies.   Review of Systems Review of Systems  Constitutional:  Negative for  chills and fever.  HENT:  Positive for congestion and sore throat. Negative for ear pain.   Eyes:  Negative for discharge and redness.  Respiratory:  Positive for cough and wheezing. Negative for shortness of breath.   Gastrointestinal:  Negative for abdominal pain, diarrhea, nausea and vomiting.     Physical Exam Triage Vital Signs ED Triage Vitals [07/25/22 1343]  Enc Vitals Group     BP 129/87     Pulse Rate 84     Resp 16     Temp 97.8 F (36.6 C)     Temp Source Oral     SpO2 97 %     Weight      Height      Head Circumference      Peak Flow      Pain Score 0     Pain Loc      Pain Edu?      Excl. in GC?    No data found.  Updated Vital Signs BP 129/87 (BP Location:  Right Arm)   Pulse 84   Temp 97.8 F (36.6 C) (Oral)   Resp 16   SpO2 97%   Physical Exam Vitals and nursing note reviewed.  Constitutional:      General: He is not in acute distress.    Appearance: He is well-developed. He is not ill-appearing.  HENT:     Head: Normocephalic and atraumatic.     Nose: Congestion present.     Mouth/Throat:     Mouth: Mucous membranes are moist.     Pharynx: Posterior oropharyngeal erythema present. No oropharyngeal exudate.     Tonsils: 0 on the right. 0 on the left.  Eyes:     Conjunctiva/sclera: Conjunctivae normal.  Cardiovascular:     Rate and Rhythm: Normal rate and regular rhythm.     Heart sounds: Normal heart sounds. No murmur heard. Pulmonary:     Effort: Pulmonary effort is normal. No respiratory distress.     Breath sounds: Wheezing (diffuse, mild) present. No rhonchi or rales.  Skin:    General: Skin is warm and dry.  Neurological:     Mental Status: He is alert.  Psychiatric:        Mood and Affect: Mood normal.        Behavior: Behavior normal.      UC Treatments / Results  Labs (all labs ordered are listed, but only abnormal results are displayed) Labs Reviewed - No data to display  EKG   Radiology DG Chest 2 View  Result Date: 07/25/2022 CLINICAL DATA:  Chronic cough EXAM: CHEST - 2 VIEW COMPARISON:  Chest two views 05/25/2021 and AP chest 11/19/2020 FINDINGS: Cardiac silhouette and mediastinal contours are within normal limits. The lungs are clear. No pleural effusion or pneumothorax. No acute skeletal abnormality. IMPRESSION: No active cardiopulmonary disease. Electronically Signed   By: Neita Garnet M.D.   On: 07/25/2022 14:24    Procedures Procedures (including critical care time)  Medications Ordered in UC Medications - No data to display  Initial Impression / Assessment and Plan / UC Course  I have reviewed the triage vital signs and the nursing notes.  Pertinent labs & imaging results that were  available during my care of the patient were reviewed by me and considered in my medical decision making (see chart for details).    Will treat to cover asthma exacerbation with steroid and given questionable opacity to right lower lobe on xray on personal review  we will also treat with Augmentin for pneumonia coverage.  Albuterol inhaler refilled.  Encouraged follow-up if no gradual improvement or with any further concerns.  Patient expresses understanding.  Final Clinical Impressions(s) / UC Diagnoses   Final diagnoses:  Asthma with acute exacerbation, unspecified asthma severity, unspecified whether persistent   Discharge Instructions   None    ED Prescriptions     Medication Sig Dispense Auth. Provider   predniSONE (DELTASONE) 20 MG tablet Take 2 tablets (40 mg total) by mouth daily with breakfast for 5 days. 10 tablet Tomi Bamberger, PA-C   amoxicillin-clavulanate (AUGMENTIN) 875-125 MG tablet Take 1 tablet by mouth every 12 (twelve) hours. 14 tablet Erma Pinto F, PA-C   albuterol (VENTOLIN HFA) 108 (90 Base) MCG/ACT inhaler Inhale 1-2 puffs into the lungs every 6 (six) hours as needed for wheezing or shortness of breath. 1 each Tomi Bamberger, PA-C      PDMP not reviewed this encounter.   Tomi Bamberger, PA-C 07/25/22 1441

## 2022-08-30 IMAGING — DX DG CHEST 1V PORT
1 series · 1 of 1 positions shown · non-contrast
Comparison: November 09, 2020

CLINICAL DATA: Intubated

EXAM:
PORTABLE CHEST 1 VIEW

[chest ap]
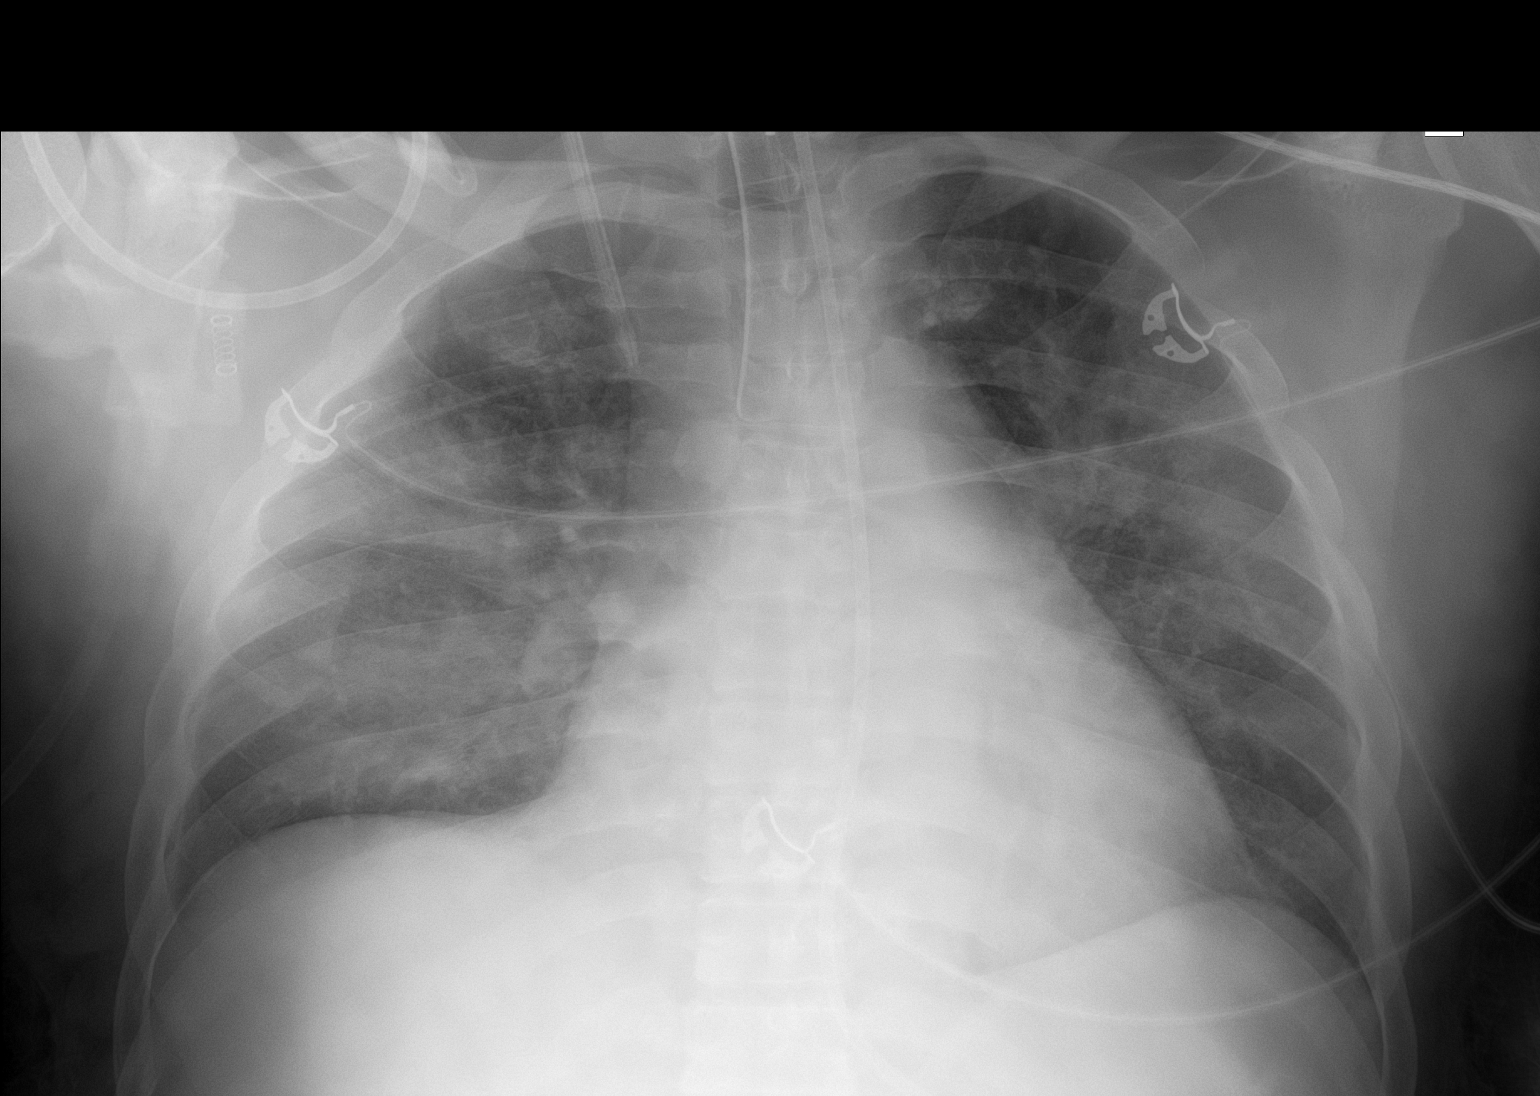

[1 of 1 positions shown; findings below may reference images not displayed]

FINDINGS: The cardiomediastinal silhouette is unchanged in contour.ETT tip
terminates 2 cm above the carina. RIGHT IJ CVC tip terminates over
the SVC. The enteric tube courses through the chest to the abdomen
beyond the field-of-view. No pleural effusion. No pneumothorax.
Similar appearance of bilateral hazy airspace opacities, overall
minimally increased since November 06, 2020. Visualized abdomen is
unremarkable. No acute osseous abnormality.
IMPRESSION: Support apparatus as described above.

## 2022-09-08 IMAGING — DX DG CHEST 1V PORT
1 series · 1 of 1 positions shown · non-contrast
Comparison: 11/11/2020 radiograph and prior studies

CLINICAL DATA: Chest pain.

EXAM:
PORTABLE CHEST 1 VIEW

[chest ap]
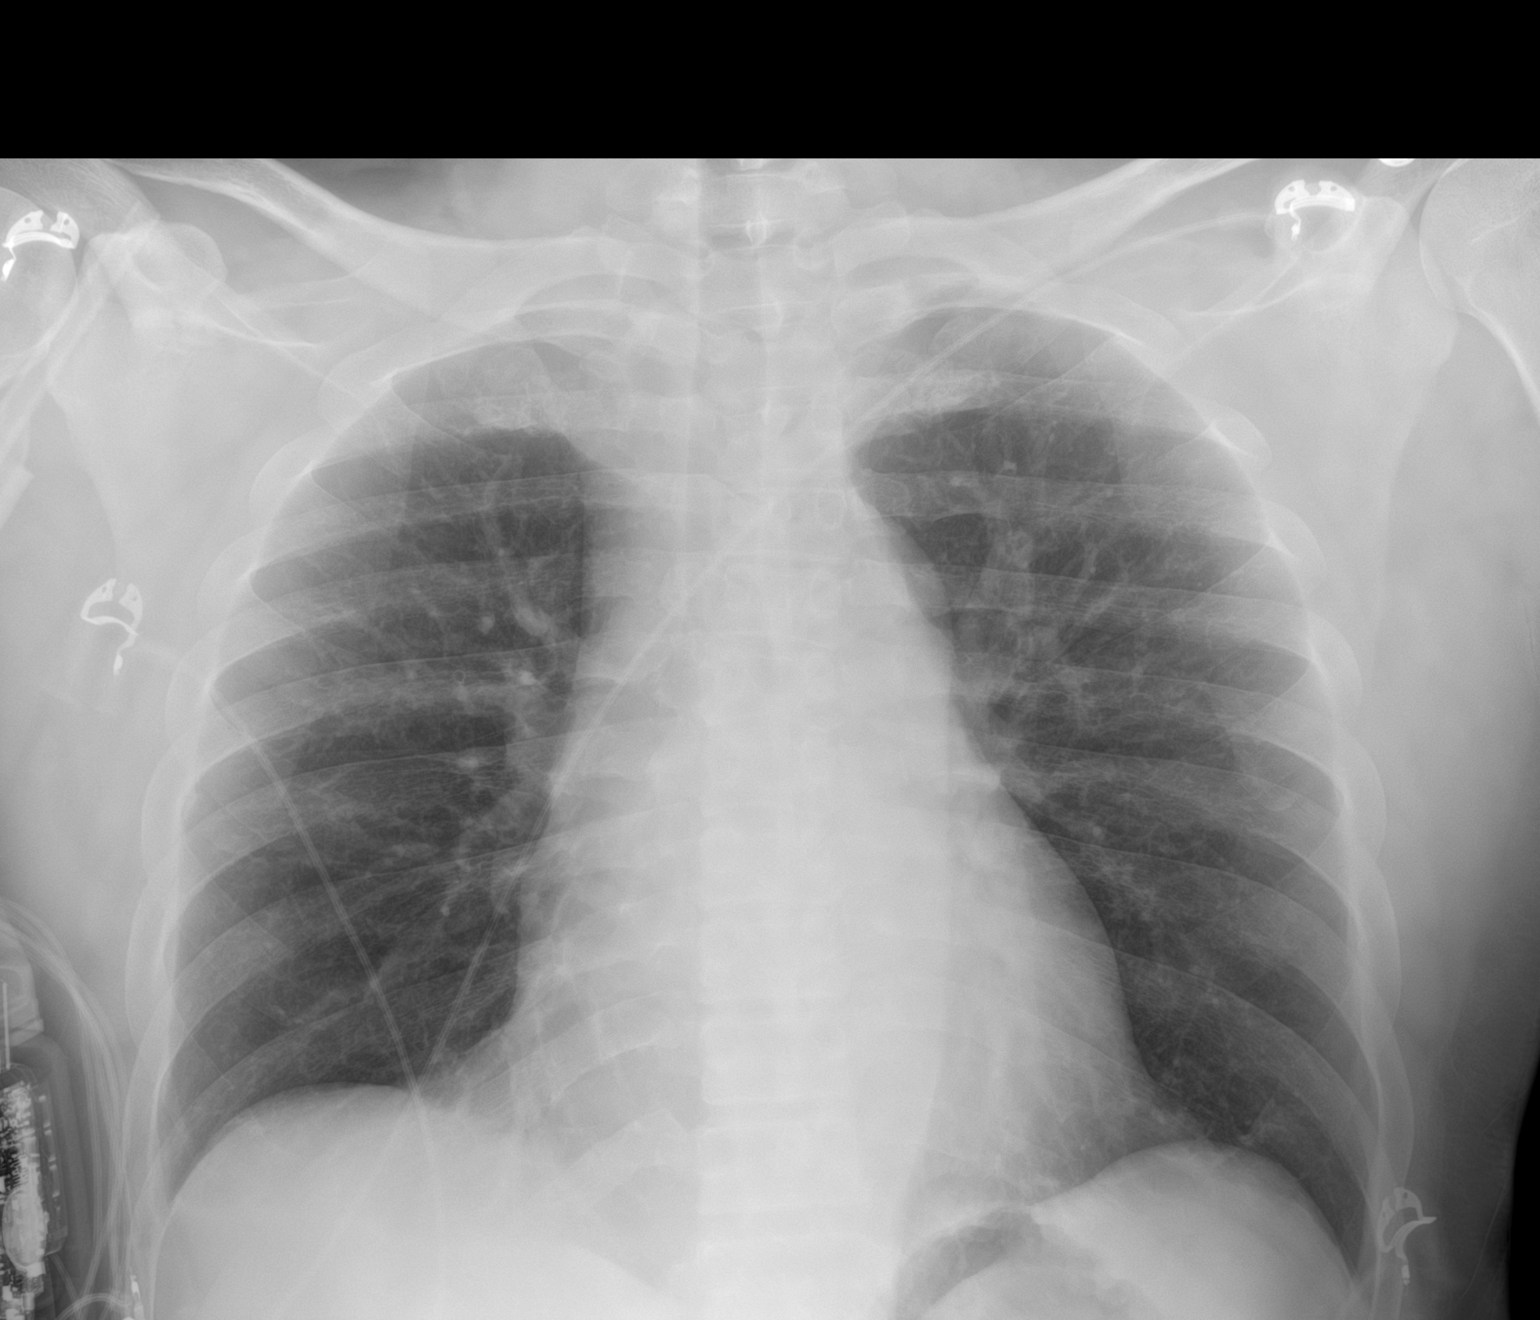

[1 of 1 positions shown; findings below may reference images not displayed]

FINDINGS: UPPER limits normal heart size noted.

There is no evidence of focal airspace disease, pulmonary edema,
suspicious pulmonary nodule/mass, pleural effusion, or pneumothorax.

No acute bony abnormalities are identified.
IMPRESSION: UPPER limits normal heart size without evidence of acute
cardiopulmonary disease.

## 2023-03-14 IMAGING — CR DG CHEST 2V
2 series · 2 of 2 positions shown · non-contrast
Comparison: Portable chest 11/19/2020 and earlier.

CLINICAL DATA: 40-year-old male with shortness of breath and
wheezing.

EXAM:
CHEST - 2 VIEW

[w chest pa]
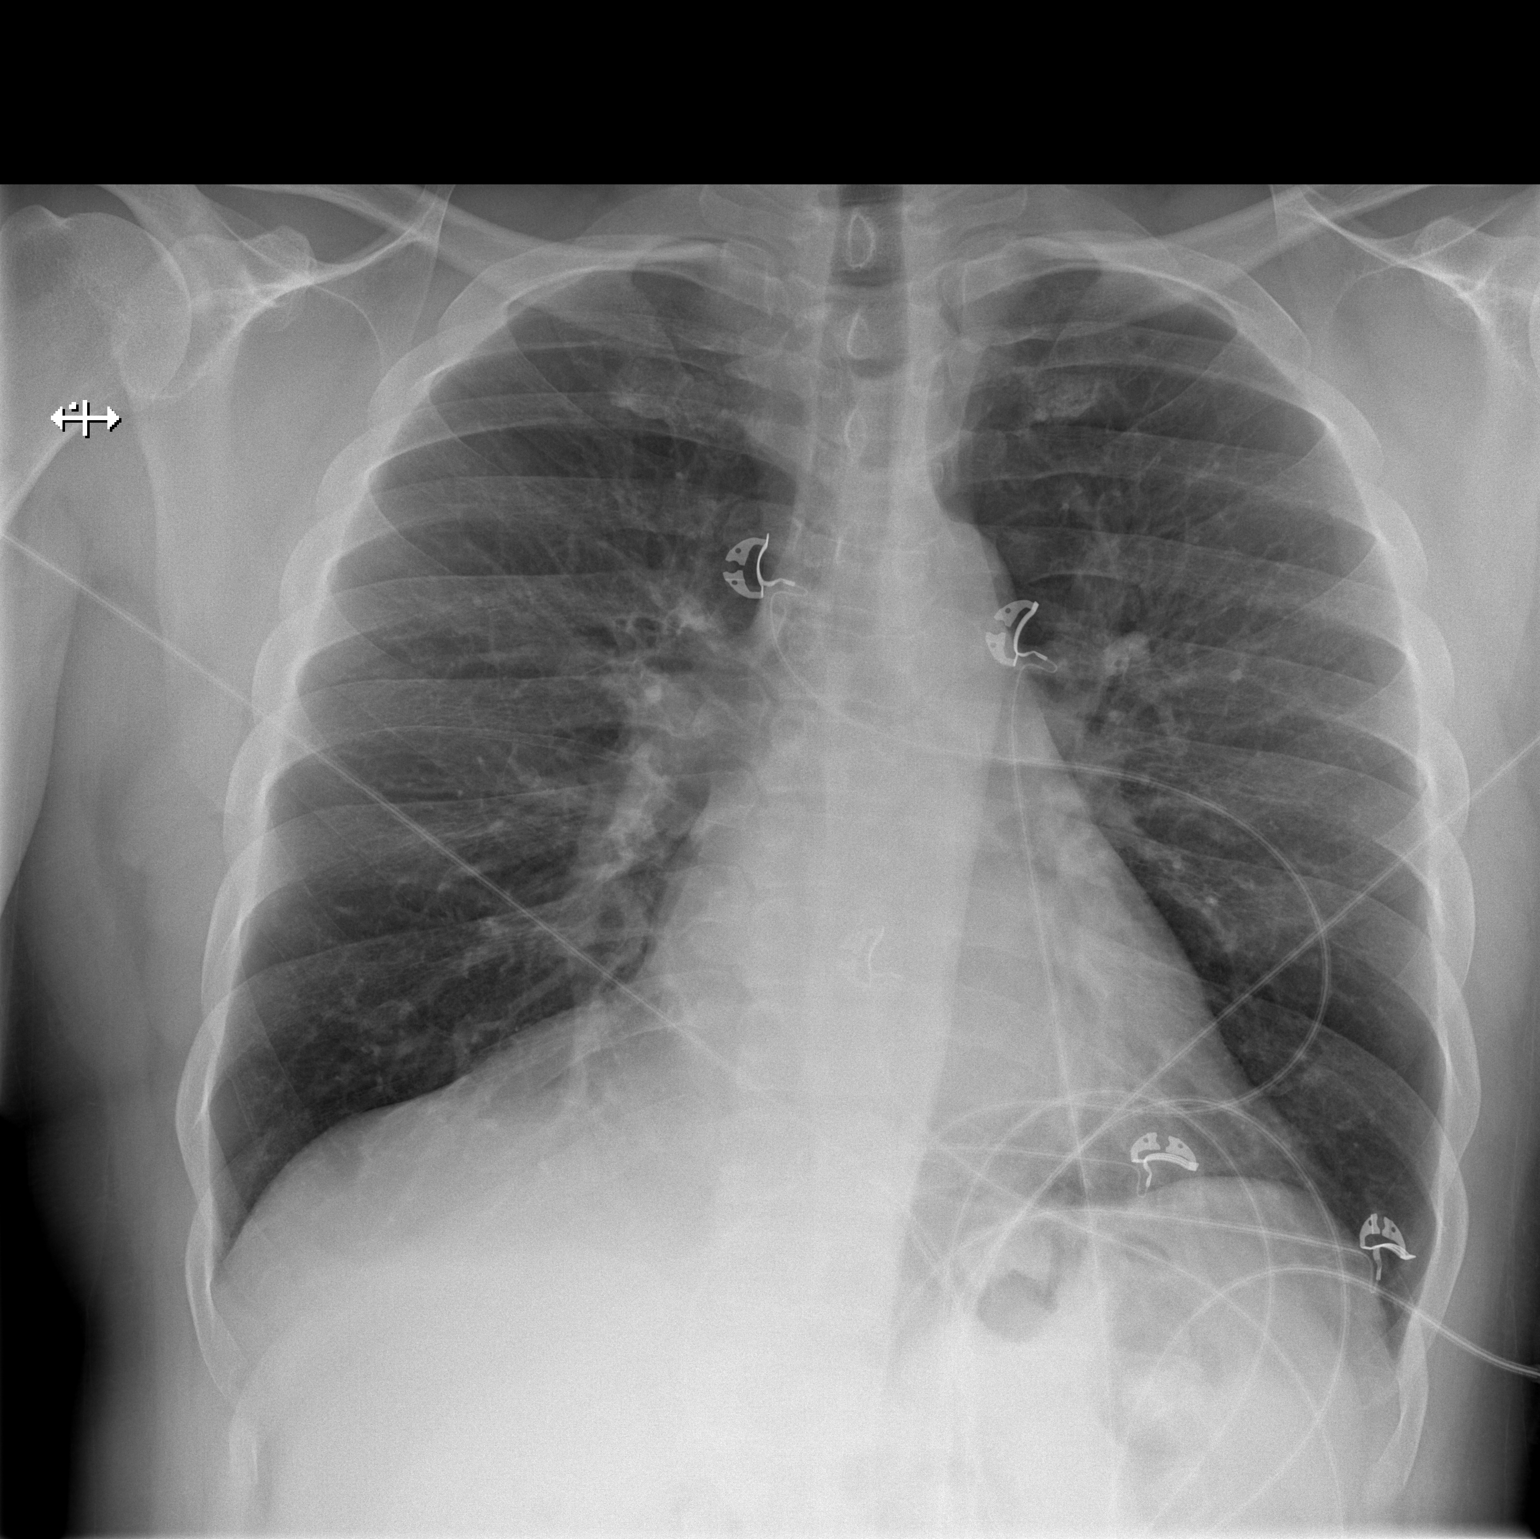

[w chest lat]
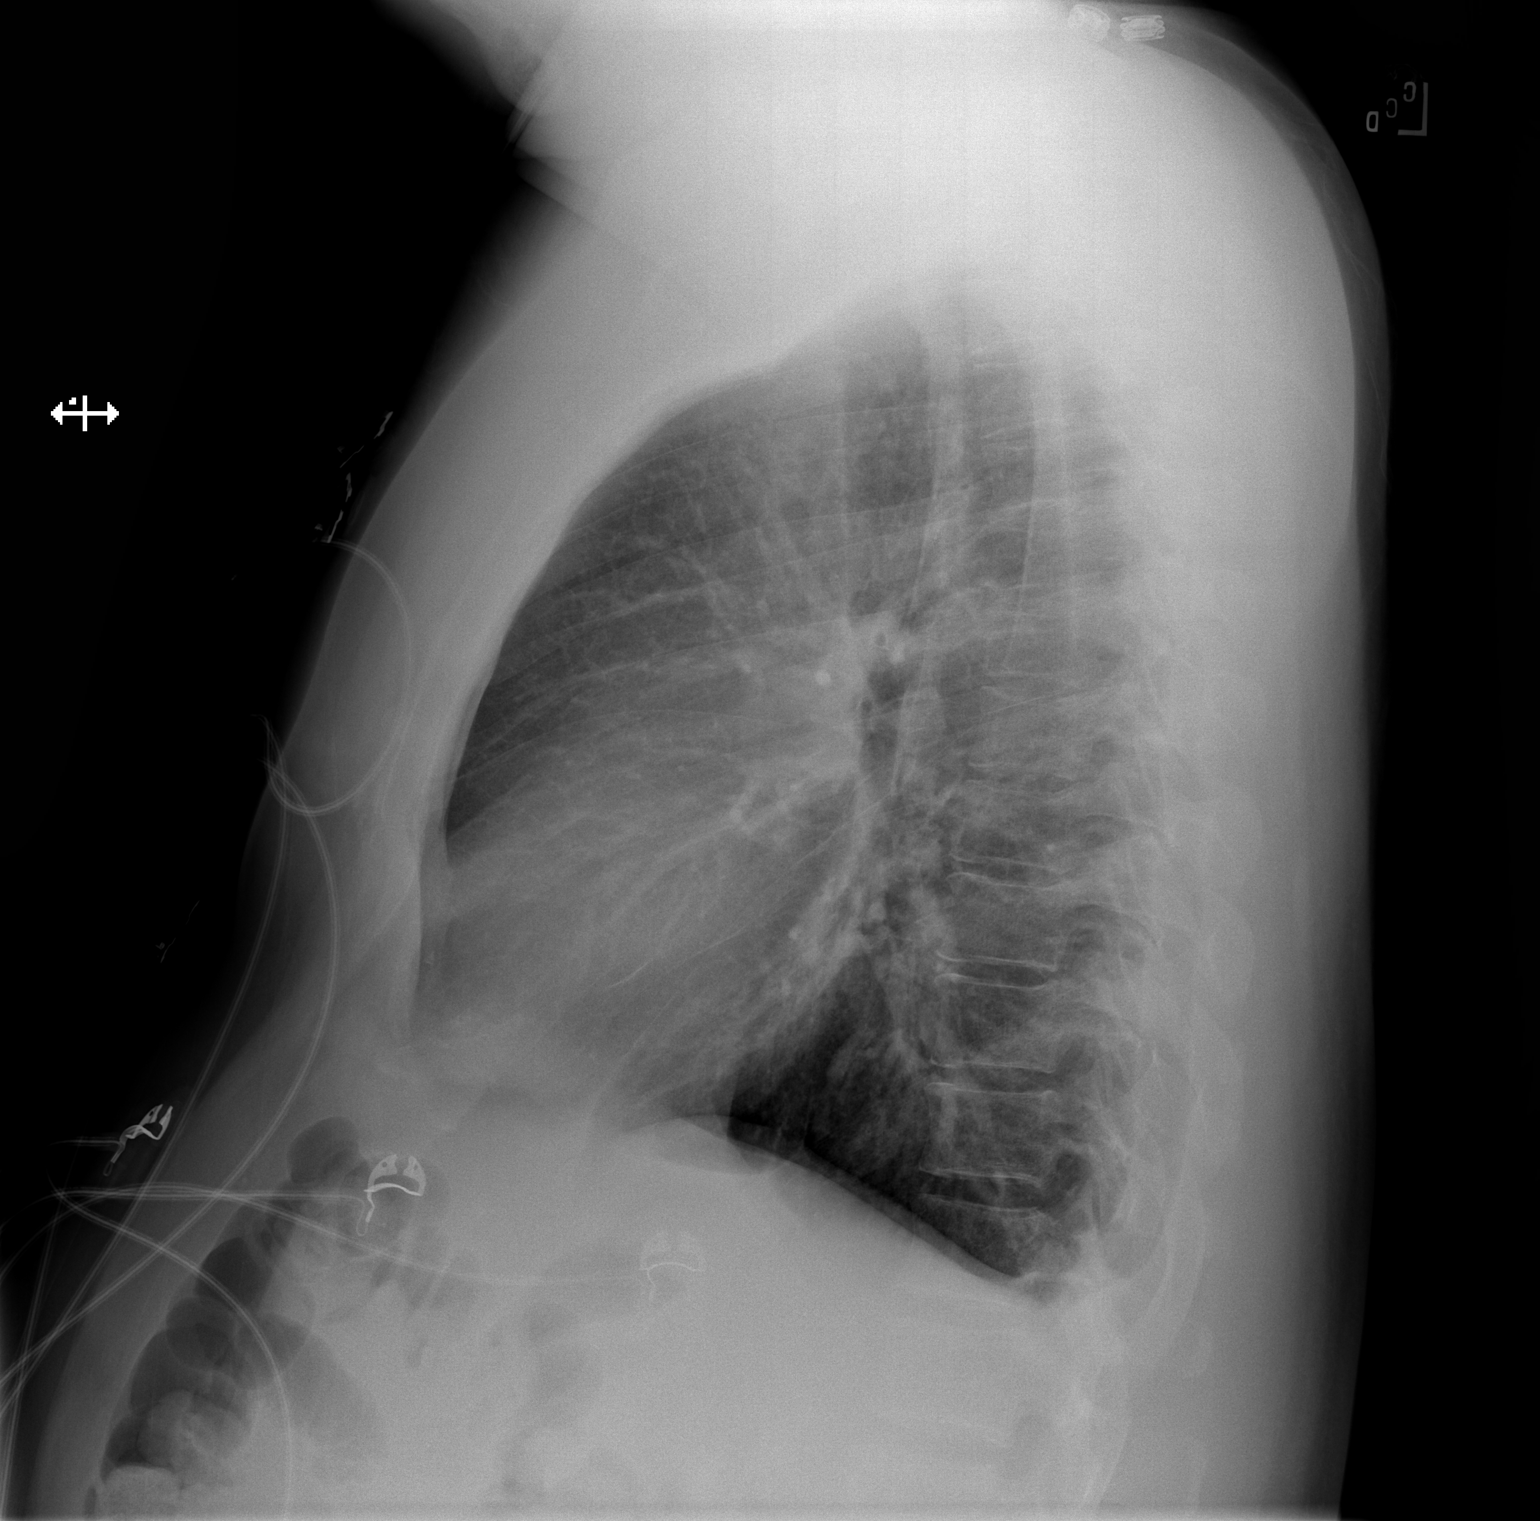

[2 of 2 positions shown; findings below may reference images not displayed]

FINDINGS: PA and lateral views. Lung volumes and mediastinal contours are
within normal limits. Visualized tracheal air column is within
normal limits. No pneumothorax. No definite pleural effusion. Lung
markings are within normal limits. No acute pulmonary opacity
suspected. No acute osseous abnormality identified. Negative visible
bowel gas pattern.
IMPRESSION: No acute cardiopulmonary abnormality.

## 2024-07-30 ENCOUNTER — Other Ambulatory Visit: Payer: Self-pay

## 2024-07-30 ENCOUNTER — Emergency Department (HOSPITAL_COMMUNITY)

## 2024-07-30 ENCOUNTER — Encounter (HOSPITAL_COMMUNITY): Payer: Self-pay

## 2024-07-30 ENCOUNTER — Emergency Department (HOSPITAL_COMMUNITY): Admission: EM | Admit: 2024-07-30 | Discharge: 2024-07-30 | Disposition: A | Discharge status: Home / Self Care

## 2024-07-30 DIAGNOSIS — R0789 Other chest pain: Secondary | ICD-10-CM | POA: Diagnosis not present

## 2024-07-30 DIAGNOSIS — T401X1A Poisoning by heroin, accidental (unintentional), initial encounter: Secondary | ICD-10-CM | POA: Diagnosis present

## 2024-07-30 DIAGNOSIS — T50901A Poisoning by unspecified drugs, medicaments and biological substances, accidental (unintentional), initial encounter: Secondary | ICD-10-CM

## 2024-07-30 DIAGNOSIS — R079 Chest pain, unspecified: Secondary | ICD-10-CM

## 2024-07-30 LAB — CBC
HCT: 49.1 % (ref 39.0–52.0)
Hemoglobin: 15.5 g/dL (ref 13.0–17.0)
MCH: 30.2 pg (ref 26.0–34.0)
MCHC: 31.6 g/dL (ref 30.0–36.0)
MCV: 95.7 fL (ref 80.0–100.0)
Platelets: 389 K/uL (ref 150–400)
RBC: 5.13 MIL/uL (ref 4.22–5.81)
RDW: 12 % (ref 11.5–15.5)
WBC: 9.3 K/uL (ref 4.0–10.5)
nRBC: 0 % (ref 0.0–0.2)

## 2024-07-30 LAB — BASIC METABOLIC PANEL WITH GFR
Anion gap: 16 — ABNORMAL HIGH (ref 5–15)
BUN: 17 mg/dL (ref 6–20)
CO2: 20 mmol/L — ABNORMAL LOW (ref 22–32)
Calcium: 9.9 mg/dL (ref 8.9–10.3)
Chloride: 101 mmol/L (ref 98–111)
Creatinine, Ser: 1.04 mg/dL (ref 0.61–1.24)
GFR, Estimated: >60 mL/min (ref >60)
Glucose, Bld: 122 mg/dL — ABNORMAL HIGH (ref 70–99)
Potassium: 3.7 mmol/L (ref 3.5–5.1)
Sodium: 137 mmol/L (ref 135–145)

## 2024-07-30 LAB — SALICYLATE LEVEL: Salicylate Lvl: <7 mg/dL — ABNORMAL LOW (ref 7.0–30.0)

## 2024-07-30 LAB — ETHANOL: Alcohol, Ethyl (B): <15 mg/dL (ref <15)

## 2024-07-30 LAB — TROPONIN I (HIGH SENSITIVITY)
Troponin I (High Sensitivity): 2 ng/L (ref <18)
Troponin I (High Sensitivity): 2 ng/L (ref <18)

## 2024-07-30 LAB — ACETAMINOPHEN LEVEL: Acetaminophen (Tylenol), Serum: <10 ug/mL — ABNORMAL LOW (ref 10–30)

## 2024-07-30 MED ORDER — LACTATED RINGERS IV BOLUS
1000.0000 mL | Freq: Once | INTRAVENOUS | Status: AC
  Administered 2024-07-30: 1000 mL via INTRAVENOUS

## 2024-07-30 MED ORDER — ONDANSETRON HCL 4 MG/2ML IJ SOLN
4.0000 mg | Freq: Once | INTRAMUSCULAR | Status: AC
  Administered 2024-07-30: 4 mg via INTRAVENOUS
  Filled 2024-07-30: qty 2

## 2024-07-30 MED ORDER — NALOXONE HCL 0.4 MG/ML IJ SOLN
0.4000 mg | INTRAMUSCULAR | 0 refills | Status: AC | PRN
  Filled 2024-07-30: qty 1, fill #0
  Filled 2024-07-31: qty 1, 1d supply, fill #0

## 2024-07-30 NOTE — ED Triage Notes (Addendum)
 Pt BIB EMS after overdosing on heroin. Pt was given 8mg  of narcan  around 17:15 by EMS and was not transported to hospital because pt refused transported and then wife called around 18:00 and wanted pt transported. On EMS arrival pt was agitated and refusing care. Per pt, his heart is going to explode and his chest is hurting. 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 Pt a&ox4.

## 2024-07-30 NOTE — ED Provider Notes (Signed)
 [place] EMERGENCY DEPARTMENT AT [facility] Provider Note   CSN: [id number] Arrival date & time: 07/30/24  1827     Patient presents with: Drug Overdose   Andrew [name] is a 43 y.o. male.   This is a 43 year old male presenting emergency department after overdose on heroin.  Was given Narcan  at 1715.  Was not initially transported, but then presented after his wife called again because he was complaining of some chest discomfort.  Patient sleepy, but arousable.  Reports he had no symptoms prior to Narcan  complaining of some chest tightness, some abdominal cramping and diarrhea.  He adamantly denies intentional overdose, or self-harm.  Denies SI, HI or hallucinations.   Drug Overdose       Prior to Admission medications   Medication Sig Start Date End Date Taking? Authorizing Provider  naloxone  (NARCAN ) 0.4 MG/ML injection Place 1 mL (0.4 mg total) into the nose as needed (respiratory depression or opiod overdose.). 07/30/24  Yes Neysa Caron JINNY, DO  albuterol  (VENTOLIN  HFA) 108 (90 Base) MCG/ACT inhaler Inhale 1-2 puffs into the lungs every 6 (six) hours as needed for wheezing or shortness of breath. 07/25/22   Billy Asberry FALCON, PA-C  amoxicillin -clavulanate (AUGMENTIN ) 875-125 MG tablet Take 1 tablet by mouth every 12 (twelve) hours. 07/25/22   Billy Asberry FALCON, PA-C  cloNIDine  (CATAPRES ) 0.1 MG tablet Take 1 tablet (0.1 mg total) by mouth 2 (two) times daily. 11/23/20   Alto Isaiah CROME, NP  cloNIDine  (CATAPRES ) 0.1 MG tablet TAKE 1 TABLET (0.1 MG TOTAL) BY MOUTH TWO TIMES DAILY. 11/23/20 11/23/21  Alto Isaiah CROME, NP  Multiple Vitamin (MULTIVITAMIN WITH MINERALS) TABS tablet Take 1 tablet by mouth daily. 11/24/20   Alto Isaiah CROME, NP  prazosin  (MINIPRESS ) 2 MG capsule Take 2 capsules (4 mg total) by mouth at bedtime. 11/23/20   Alto Isaiah CROME, NP  traZODone  (DESYREL ) 50 MG tablet Take 1 tablet (50 mg total) by mouth at bedtime. 11/23/20   Alto Isaiah CROME, NP   vitamin B-12 1000 MCG tablet Take 1 tablet (1,000 mcg total) by mouth daily. 11/24/20   Alto Isaiah CROME, NP    Allergies: Patient has no known allergies.    Review of Systems  Updated Vital Signs BP 104/64   Pulse 80   Temp 97.7 F (36.5 C) (Oral)   Resp 17   Ht 5' 8 (1.727 m)   Wt 94.3 kg   SpO2 100%   BMI 31.63 kg/m   Physical Exam Vitals and nursing note reviewed.  Constitutional:      General: He is not in acute distress.    Appearance: He is not toxic-appearing.  HENT:     Head: Normocephalic.     Nose: Nose normal.     Mouth/Throat:     Mouth: Mucous membranes are moist.  Eyes:     Conjunctiva/sclera: Conjunctivae normal.  Cardiovascular:     Rate and Rhythm: Normal rate and regular rhythm.  Pulmonary:     Effort: Pulmonary effort is normal.     Breath sounds: Normal breath sounds.  Abdominal:     General: Abdomen is flat. There is no distension.     Palpations: Abdomen is soft.     Tenderness: There is no abdominal tenderness. There is no guarding or rebound.  Musculoskeletal:        General: Normal range of motion.  Skin:    General: Skin is warm and dry.     Capillary Refill: Capillary refill  takes less than 2 seconds.  Neurological:     General: No focal deficit present.     Mental Status: He is alert and oriented to person, place, and time.  Psychiatric:        Mood and Affect: Mood normal.        Behavior: Behavior normal.     (all labs ordered are listed, but only abnormal results are displayed) Labs Reviewed  BASIC METABOLIC PANEL WITH GFR - Abnormal; Notable for the following components:      Result Value   CO2 20 (*)    Glucose, Bld 122 (*)    Anion gap 16 (*)    All other components within normal limits  ACETAMINOPHEN  LEVEL - Abnormal; Notable for the following components:   Acetaminophen  (Tylenol ), Serum <10 (*)    All other components within normal limits  SALICYLATE LEVEL - Abnormal; Notable for the following components:    Salicylate Lvl <7.0 (*)    All other components within normal limits  CBC  ETHANOL  TROPONIN I (HIGH SENSITIVITY)  TROPONIN I (HIGH SENSITIVITY)    EKG: EKG Interpretation Date/Time:  Thursday July 30 2024 18:38:42 EST Ventricular Rate:  85 PR Interval:  116 QRS Duration:  84 QT Interval:  364 QTC Calculation: 433 R Axis:   93  Text Interpretation: Normal sinus rhythm Rightward axis Borderline ECG Confirmed by Neysa Clap [phone]) on 07/30/2024 7:39:15 PM  Radiology: DG Chest 2 View Result Date: 07/30/2024 EXAM: 2 VIEW(S) XRAY OF THE CHEST 07/30/2024 07:42:00 PM COMPARISON: 11 / 29 / 23. CLINICAL HISTORY: CP FINDINGS: LUNGS AND PLEURA: No focal pulmonary opacity. No pleural effusion. No pneumothorax. HEART AND MEDIASTINUM: No acute abnormality of the cardiac and mediastinal silhouettes. BONES AND SOFT TISSUES: No acute osseous abnormality. IMPRESSION: 1. No acute cardiopulmonary process. Electronically signed by: Norman Gatlin MD 07/30/2024 08:01 PM EST RP Workstation: [id number]     Procedures   Medications Ordered in the ED  lactated ringers  bolus 1,000 mL (0 mLs Intravenous Stopped 07/30/24 2249)  ondansetron  (ZOFRAN ) injection 4 mg (4 mg Intravenous Given 07/30/24 2027)                                    Medical Decision Making 43 year old with chest discomfort after receiving Narcan  for unintentional heroin overdose.  Afebrile nontachycardic hemodynamically stable.  Maintaining his oxygen  saturation on room air.  Normal respiratory drive.  Lung sounds clear.  Chest x-ray without pneumonia pneumothorax.  EKG without ischemic changes.  Troponins negative.  ACS unlikely.  Talk screen negative.  Adamantly denies intentional self-harm, SI, HI.  No significant metabolic derangements.  No leukocytosis.  Suspect his symptoms are secondary to minor withdrawal.  Observed in the emergency department without respiratory complications.  Ambulatory with steady gait and clinically  sober.  Discharged in stable condition.  Amount and/or Complexity of Data Reviewed Labs: ordered. Radiology: ordered.  Risk Prescription drug management.       Final diagnoses:  Accidental overdose, initial encounter  Chest pain, unspecified type    ED Discharge Orders          Ordered    naloxone  (NARCAN ) 0.4 MG/ML injection  As needed        07/30/24 2218               Neysa Clap PARAS, DO 07/30/24 2255

## 2024-07-30 NOTE — Discharge Instructions (Signed)
 Please follow-up with your primary doctor.  Return for fevers, chills, lightheadedness, dizziness, worsening chest pain, difficulty breathing, uncontrolled nausea vomiting or any new or worsening symptoms that are concerning to you.  You may go to the behavioral health urgent care for help with substance abuse.

## 2024-07-31 ENCOUNTER — Other Ambulatory Visit (HOSPITAL_COMMUNITY): Payer: Self-pay
